# Patient Record
Sex: Female | Born: 1985 | Race: White | Hispanic: No | Smoking: Current every day smoker
Health system: Southern US, Community
[De-identification: ages and names within clinical notes are randomized; demographics above are authoritative.]

## PROBLEM LIST (undated history)

## (undated) DIAGNOSIS — N200 Calculus of kidney: Secondary | ICD-10-CM

## (undated) DIAGNOSIS — R6889 Other general symptoms and signs: Secondary | ICD-10-CM

## (undated) DIAGNOSIS — F17210 Nicotine dependence, cigarettes, uncomplicated: Secondary | ICD-10-CM

## (undated) DIAGNOSIS — F419 Anxiety disorder, unspecified: Secondary | ICD-10-CM

## (undated) DIAGNOSIS — F329 Major depressive disorder, single episode, unspecified: Secondary | ICD-10-CM

## (undated) DIAGNOSIS — Z889 Allergy status to unspecified drugs, medicaments and biological substances status: Secondary | ICD-10-CM

## (undated) DIAGNOSIS — R0602 Shortness of breath: Secondary | ICD-10-CM

## (undated) DIAGNOSIS — M25512 Pain in left shoulder: Secondary | ICD-10-CM

## (undated) DIAGNOSIS — G8929 Other chronic pain: Secondary | ICD-10-CM

## (undated) DIAGNOSIS — R7989 Other specified abnormal findings of blood chemistry: Secondary | ICD-10-CM

## (undated) DIAGNOSIS — G43909 Migraine, unspecified, not intractable, without status migrainosus: Secondary | ICD-10-CM

## (undated) DIAGNOSIS — M539 Dorsopathy, unspecified: Secondary | ICD-10-CM

## (undated) DIAGNOSIS — F32A Depression, unspecified: Secondary | ICD-10-CM

## (undated) DIAGNOSIS — J45909 Unspecified asthma, uncomplicated: Secondary | ICD-10-CM

## (undated) DIAGNOSIS — F410 Panic disorder [episodic paroxysmal anxiety] without agoraphobia: Secondary | ICD-10-CM

## (undated) DIAGNOSIS — N879 Dysplasia of cervix uteri, unspecified: Secondary | ICD-10-CM

## (undated) DIAGNOSIS — Z8709 Personal history of other diseases of the respiratory system: Secondary | ICD-10-CM

## (undated) DIAGNOSIS — G709 Myoneural disorder, unspecified: Secondary | ICD-10-CM

## (undated) DIAGNOSIS — D649 Anemia, unspecified: Secondary | ICD-10-CM

## (undated) DIAGNOSIS — E349 Endocrine disorder, unspecified: Secondary | ICD-10-CM

## (undated) DIAGNOSIS — N898 Other specified noninflammatory disorders of vagina: Secondary | ICD-10-CM

## (undated) DIAGNOSIS — L680 Hirsutism: Secondary | ICD-10-CM

## (undated) DIAGNOSIS — J40 Bronchitis, not specified as acute or chronic: Secondary | ICD-10-CM

## (undated) DIAGNOSIS — R109 Unspecified abdominal pain: Principal | ICD-10-CM

## (undated) DIAGNOSIS — Z309 Encounter for contraceptive management, unspecified: Principal | ICD-10-CM

## (undated) DIAGNOSIS — J302 Other seasonal allergic rhinitis: Secondary | ICD-10-CM

## (undated) DIAGNOSIS — F319 Bipolar disorder, unspecified: Secondary | ICD-10-CM

## (undated) HISTORY — DX: Dysplasia of cervix uteri, unspecified: N87.9

## (undated) HISTORY — DX: Unspecified asthma, uncomplicated: J45.909

## (undated) HISTORY — DX: Allergy status to unspecified drugs, medicaments and biological substances: Z88.9

## (undated) HISTORY — DX: Depression, unspecified: F32.A

## (undated) HISTORY — DX: Anxiety disorder, unspecified: F41.9

## (undated) HISTORY — DX: Myoneural disorder, unspecified: G70.9

## (undated) HISTORY — DX: Endocrine disorder, unspecified: E34.9

## (undated) HISTORY — DX: Anemia, unspecified: D64.9

## (undated) HISTORY — DX: Migraine, unspecified, not intractable, without status migrainosus: G43.909

## (undated) HISTORY — DX: Hirsutism: L68.0

## (undated) HISTORY — DX: Unspecified abdominal pain: R10.9

## (undated) HISTORY — PX: DILATION AND CURETTAGE OF UTERUS: SHX78

## (undated) HISTORY — DX: Encounter for contraceptive management, unspecified: Z30.9

## (undated) HISTORY — DX: Other specified noninflammatory disorders of vagina: N89.8

---
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
## 2004-03-02 HISTORY — PX: HX DILATION AND CURETTAGE: SHX78

## 2004-12-26 ENCOUNTER — Emergency Department (HOSPITAL_COMMUNITY): Admission: EM | Admit: 2004-12-26 | Discharge: 2004-12-26 | Payer: Self-pay | Admitting: Emergency Medicine

## 2006-08-15 ENCOUNTER — Emergency Department (HOSPITAL_COMMUNITY): Admission: EM | Admit: 2006-08-15 | Discharge: 2006-08-15 | Payer: Self-pay | Admitting: Emergency Medicine

## 2006-11-29 ENCOUNTER — Ambulatory Visit (HOSPITAL_COMMUNITY): Admission: RE | Admit: 2006-11-29 | Discharge: 2006-11-29 | Payer: Self-pay | Admitting: General Surgery

## 2007-01-14 ENCOUNTER — Ambulatory Visit (HOSPITAL_COMMUNITY): Admission: RE | Admit: 2007-01-14 | Discharge: 2007-01-14 | Payer: Self-pay | Admitting: Endocrinology

## 2007-02-05 ENCOUNTER — Emergency Department (HOSPITAL_COMMUNITY): Admission: EM | Admit: 2007-02-05 | Discharge: 2007-02-05 | Payer: Self-pay | Admitting: Emergency Medicine

## 2007-05-19 ENCOUNTER — Ambulatory Visit (HOSPITAL_COMMUNITY): Admission: RE | Admit: 2007-05-19 | Discharge: 2007-05-19 | Payer: Self-pay | Admitting: Endocrinology

## 2008-04-07 ENCOUNTER — Emergency Department
Admission: EM | Admit: 2008-04-07 | Disposition: A | Discharge status: Home / Self Care | Payer: Self-pay | Source: Ambulatory Visit

## 2010-03-23 ENCOUNTER — Encounter: Payer: Self-pay | Admitting: Pediatrics

## 2010-03-23 ENCOUNTER — Encounter: Payer: Self-pay | Admitting: Endocrinology

## 2010-05-01 ENCOUNTER — Emergency Department (HOSPITAL_COMMUNITY)
Admission: EM | Admit: 2010-05-01 | Discharge: 2010-05-01 | Disposition: A | Discharge status: Home / Self Care | Payer: Medicaid Other | Attending: Emergency Medicine | Admitting: Emergency Medicine

## 2010-05-01 DIAGNOSIS — R112 Nausea with vomiting, unspecified: Secondary | ICD-10-CM | POA: Insufficient documentation

## 2010-05-01 DIAGNOSIS — R197 Diarrhea, unspecified: Secondary | ICD-10-CM | POA: Insufficient documentation

## 2010-05-01 DIAGNOSIS — K5289 Other specified noninfective gastroenteritis and colitis: Secondary | ICD-10-CM | POA: Insufficient documentation

## 2010-05-01 LAB — BASIC METABOLIC PANEL
CO2: 26 mEq/L (ref 19–32)
Creatinine, Ser: 0.66 mg/dL (ref 0.4–1.2)
GFR calc Af Amer: >60 mL/min (ref >60)
Glucose, Bld: 109 mg/dL — ABNORMAL HIGH (ref 70–99)

## 2010-05-01 LAB — DIFFERENTIAL
Eosinophils Absolute: 0 10*3/uL (ref 0.0–0.7)
Eosinophils Relative: 1 % (ref 0–5)
Lymphocytes Relative: 47 % — ABNORMAL HIGH (ref 12–46)
Lymphs Abs: 2.5 10*3/uL (ref 0.7–4.0)
Monocytes Relative: 7 % (ref 3–12)
Neutro Abs: 2.4 10*3/uL (ref 1.7–7.7)
Neutrophils Relative %: 45 % (ref 43–77)

## 2010-05-01 LAB — CBC
MCH: 32.9 pg (ref 26.0–34.0)
RBC: 4.16 MIL/uL (ref 3.87–5.11)
RDW: 12.1 % (ref 11.5–15.5)
WBC: 5.4 10*3/uL (ref 4.0–10.5)

## 2010-10-15 ENCOUNTER — Emergency Department (HOSPITAL_COMMUNITY)
Admission: EM | Admit: 2010-10-15 | Discharge: 2010-10-16 | Disposition: A | Discharge status: Home / Self Care | Payer: Medicaid Other | Attending: Emergency Medicine | Admitting: Emergency Medicine

## 2010-10-15 ENCOUNTER — Encounter: Payer: Self-pay | Admitting: *Deleted

## 2010-10-15 ENCOUNTER — Emergency Department (HOSPITAL_COMMUNITY): Payer: Medicaid Other

## 2010-10-15 DIAGNOSIS — J45909 Unspecified asthma, uncomplicated: Secondary | ICD-10-CM | POA: Insufficient documentation

## 2010-10-15 DIAGNOSIS — F17200 Nicotine dependence, unspecified, uncomplicated: Secondary | ICD-10-CM | POA: Insufficient documentation

## 2010-10-15 DIAGNOSIS — O200 Threatened abortion: Secondary | ICD-10-CM | POA: Insufficient documentation

## 2010-10-15 LAB — URINALYSIS, ROUTINE W REFLEX MICROSCOPIC
Bilirubin Urine: NEGATIVE
Hgb urine dipstick: NEGATIVE
Ketones, ur: NEGATIVE mg/dL
Protein, ur: NEGATIVE mg/dL
Specific Gravity, Urine: 1.025 (ref 1.005–1.030)

## 2010-10-15 NOTE — ED Notes (Signed)
Unable to obtain fetal heart tones.  Dr. Lynelle Doctor notified via phone.  Pt is currently going over to ultrasound.  Nursing staff to continue to monitor.

## 2010-10-15 NOTE — ED Notes (Signed)
Pt reports pain in lower abd starting just prior to arrival here in ED, pt reports she is approx [redacted] weeks pregnant and is scheduled to see Dr. Emelda Fear on aug 30, pt reports her last period June 5th, pt denies any bleeding or vaginal d/c

## 2010-10-15 NOTE — ED Notes (Signed)
Pregnant with abdominal pain, denies bleeding, states pain started within the last hour

## 2010-10-15 NOTE — ED Provider Notes (Addendum)
History     CSN: [id number] Arrival date & time: 10/15/2010 10:20 PM  Chief Complaint  Patient presents with  . Abdominal Pain   Patient is a 25 y.o. female presenting with abdominal pain. The history is provided by the patient.  Abdominal Pain The primary symptoms of the illness include abdominal pain. The primary symptoms of the illness do not include vaginal bleeding. The current episode started 1 to 2 hours ago. The onset of the illness was sudden. The problem has not changed since onset. The abdominal pain radiates to the RLQ. The abdominal pain is relieved by nothing. Exacerbated by: nothing.  The patient states that she believes she is currently pregnant ([redacted] weeks pregnant, [facility] May 12 2011). Symptoms associated with the illness do not include anorexia, diaphoresis, constipation, urgency, hematuria, frequency or back pain.    Past Medical History  Diagnosis Date  . Asthma     History reviewed. No pertinent past surgical history.  History reviewed. No pertinent family history.  History  Substance Use Topics  . Smoking status: Current Everyday Smoker    Types: Cigarettes  . Smokeless tobacco: Not on file  . Alcohol Use: No    OB History    Grav Para Term Preterm Abortions TAB SAB Ect Mult Living   1               Review of Systems  Constitutional: Negative for diaphoresis.  Gastrointestinal: Positive for abdominal pain. Negative for constipation and anorexia.  Genitourinary: Negative for urgency, frequency, hematuria and vaginal bleeding.  Musculoskeletal: Negative for back pain.  All other systems reviewed and are negative.    Physical Exam  BP 116/80  Pulse 99  Temp(Src) 99.2 F (37.3 C) (Oral)  Resp 20  Ht 5\' 3"  (1.6 m)  Wt 112 lb (50.803 kg)  BMI 19.84 kg/m2  SpO2 98%  Physical Exam  Constitutional: She appears well-developed and well-nourished. No distress.  HENT:  Head: Normocephalic and atraumatic.  Right Ear: External ear normal.  Left Ear:  External ear normal.  Eyes: Conjunctivae are normal. Right eye exhibits no discharge. Left eye exhibits no discharge. No scleral icterus.  Neck: Neck supple. No tracheal deviation present.  Cardiovascular: Normal rate, regular rhythm and intact distal pulses.   Pulmonary/Chest: Effort normal and breath sounds normal. No stridor. No respiratory distress. She has no wheezes. She has no rales.  Abdominal: Soft. Bowel sounds are normal. She exhibits no distension. There is no tenderness. There is no rebound and no guarding.  Genitourinary: Vagina normal. Pelvic exam was performed with patient supine. There is no rash, tenderness or lesion on the right labia. There is no rash, tenderness or lesion on the left labia. Uterus is enlarged. Cervix exhibits no motion tenderness and no discharge. Right adnexum displays no mass, no tenderness and no fullness. Left adnexum displays no mass, no tenderness and no fullness.       Cervix closed, uterus gravid  Musculoskeletal: She exhibits no edema and no tenderness.  Neurological: She is alert. She has normal strength. No sensory deficit. Cranial nerve deficit:  no gross defecits noted. She exhibits normal muscle tone. She displays no seizure activity. Coordination normal.  Skin: Skin is warm and dry. No rash noted.  Psychiatric: She has a normal mood and affect.    ED Course  Procedures US Ob Comp Less 14 Wks  10/16/2010  *RADIOLOGY REPORT*  Clinical Data: First trimester pregnancy.  Pelvic pain.  OBSTETRIC <14 WK [place] AND TRANSVAGINAL  OB US  Technique: Both transabdominal and transvaginal ultrasound examinations were performed for complete evaluation of the gestation as well as the maternal uterus, adnexal regions, and pelvic cul-de-sac.  Findings: Transabdominal and transvaginal sonography reveals an intrauterine gestational sac with mean sac diameter of 4.9 mm compatible with 5 weeks 2 days gestation.  Yolk sac is visible but an embryo is not yet visible.  No  subchorionic hemorrhage is identified.  Double decidual reaction noted.  The right ovary contains a 2.3 cm cyst but otherwise appears normal.  The left ovary appears normal.  Trace maternal free pelvic fluid is identified.  IMPRESSION:  1.  Intrauterine gestational sac with internal yolk sac.  Mean sac diameter places the pregnancy at 5 weeks 2 days gestation. 2.  2.3 cm right ovarian simple appearing cyst. 3.  Trace free pelvic fluid.  Original Report Authenticated By: Dellia Hagadorn, M.D.   US Ob Transvaginal  10/16/2010  *RADIOLOGY REPORT*  Clinical Data: First trimester pregnancy.  Pelvic pain.  OBSTETRIC <14 WK [place] AND TRANSVAGINAL OB US  Technique: Both transabdominal and transvaginal ultrasound examinations were performed for complete evaluation of the gestation as well as the maternal uterus, adnexal regions, and pelvic cul-de-sac.  Findings: Transabdominal and transvaginal sonography reveals an intrauterine gestational sac with mean sac diameter of 4.9 mm compatible with 5 weeks 2 days gestation.  Yolk sac is visible but an embryo is not yet visible.  No subchorionic hemorrhage is identified.  Double decidual reaction noted.  The right ovary contains a 2.3 cm cyst but otherwise appears normal.  The left ovary appears normal.  Trace maternal free pelvic fluid is identified.  IMPRESSION:  1.  Intrauterine gestational sac with internal yolk sac.  Mean sac diameter places the pregnancy at 5 weeks 2 days gestation. 2.  2.3 cm right ovarian simple appearing cyst. 3.  Trace free pelvic fluid.  Original Report Authenticated By: Dellia Sloma, M.D.    Labs Reviewed  URINALYSIS, ROUTINE W REFLEX MICROSCOPIC  ABO/RH  CBC  URINE CULTURE    MDM Patient had mild abdominal pain. Her ultrasound shows a normal IUP at 5 weeks 2 days. No signs of ectopic pregnancy. No sign of UTI. Her exam is rather benign no suggestion of appendicitis. Certainly cannot rule out threatened AB, however this point  patient appears stable with reassuring findings on exam. We'll discharge her to home to follow up with her OB doctor. Precautions given.  I will add on a quantitative hCG for followup purposes with  her OB doctor     Celene Kras, MD 10/16/10 7829  Celene Kras, MD 10/17/10 [phone]

## 2010-10-16 LAB — CBC
HCT: 37.4 % (ref 36.0–46.0)
Hemoglobin: 13 g/dL (ref 12.0–15.0)
MCHC: 34.8 g/dL (ref 30.0–36.0)
MCV: 96.6 fL (ref 78.0–100.0)
Platelets: 200 10*3/uL (ref 150–400)
RBC: 3.87 MIL/uL (ref 3.87–5.11)
WBC: 6 10*3/uL (ref 4.0–10.5)

## 2010-10-16 LAB — HCG, QUANTITATIVE, PREGNANCY: hCG, Beta Chain, Quant, S: 4550 m[IU]/mL — ABNORMAL HIGH (ref <5)

## 2010-10-16 LAB — ABO/RH: ABO/RH(D): A POS

## 2010-10-16 MED ORDER — ALBUTEROL SULFATE HFA 108 (90 BASE) MCG/ACT IN AERS: 1.0000 | INHALATION_SPRAY | Freq: Four times a day (QID) | RESPIRATORY_TRACT | Status: DC | PRN

## 2010-10-17 LAB — GC/CHLAMYDIA PROBE AMP, GENITAL
Chlamydia, DNA Probe: NEGATIVE
GC Probe Amp, Genital: NEGATIVE

## 2010-10-17 LAB — URINE CULTURE

## 2010-10-30 ENCOUNTER — Other Ambulatory Visit: Payer: Self-pay | Admitting: Adult Health

## 2010-10-30 ENCOUNTER — Other Ambulatory Visit (HOSPITAL_COMMUNITY)
Admission: RE | Admit: 2010-10-30 | Discharge: 2010-10-30 | Disposition: A | Discharge status: Home / Self Care | Payer: Medicaid Other | Source: Ambulatory Visit | Attending: Obstetrics and Gynecology | Admitting: Obstetrics and Gynecology

## 2010-10-30 DIAGNOSIS — Z01419 Encounter for gynecological examination (general) (routine) without abnormal findings: Secondary | ICD-10-CM | POA: Insufficient documentation

## 2010-10-30 DIAGNOSIS — Z113 Encounter for screening for infections with a predominantly sexual mode of transmission: Secondary | ICD-10-CM | POA: Insufficient documentation

## 2010-10-30 DIAGNOSIS — R87810 Cervical high risk human papillomavirus (HPV) DNA test positive: Secondary | ICD-10-CM | POA: Insufficient documentation

## 2010-11-29 ENCOUNTER — Encounter (HOSPITAL_COMMUNITY): Payer: Self-pay | Admitting: *Deleted

## 2010-11-29 ENCOUNTER — Emergency Department (HOSPITAL_COMMUNITY)
Admission: EM | Admit: 2010-11-29 | Discharge: 2010-11-30 | Disposition: A | Discharge status: Home / Self Care | Payer: Medicaid Other | Attending: Emergency Medicine | Admitting: Emergency Medicine

## 2010-11-29 DIAGNOSIS — O219 Vomiting of pregnancy, unspecified: Secondary | ICD-10-CM

## 2010-11-29 DIAGNOSIS — O210 Mild hyperemesis gravidarum: Secondary | ICD-10-CM | POA: Insufficient documentation

## 2010-11-29 DIAGNOSIS — R509 Fever, unspecified: Secondary | ICD-10-CM | POA: Insufficient documentation

## 2010-11-29 DIAGNOSIS — O99330 Smoking (tobacco) complicating pregnancy, unspecified trimester: Secondary | ICD-10-CM | POA: Insufficient documentation

## 2010-11-29 DIAGNOSIS — O99891 Other specified diseases and conditions complicating pregnancy: Secondary | ICD-10-CM | POA: Insufficient documentation

## 2010-11-29 LAB — BASIC METABOLIC PANEL
CO2: 27 mEq/L (ref 19–32)
Chloride: 100 mEq/L (ref 96–112)
Glucose, Bld: 90 mg/dL (ref 70–99)
Potassium: 3.6 mEq/L (ref 3.5–5.1)
Sodium: 136 mEq/L (ref 135–145)

## 2010-11-29 LAB — CBC
Hemoglobin: 12.7 g/dL (ref 12.0–15.0)
Platelets: 222 10*3/uL (ref 150–400)
RBC: 3.8 MIL/uL — ABNORMAL LOW (ref 3.87–5.11)
WBC: 8.2 10*3/uL (ref 4.0–10.5)

## 2010-11-29 LAB — DIFFERENTIAL
Eosinophils Absolute: 0 10*3/uL (ref 0.0–0.7)
Lymphocytes Relative: 31 % (ref 12–46)
Lymphs Abs: 2.5 10*3/uL (ref 0.7–4.0)
Monocytes Relative: 5 % (ref 3–12)
Neutro Abs: 5.2 10*3/uL (ref 1.7–7.7)
Neutrophils Relative %: 64 % (ref 43–77)

## 2010-11-29 LAB — URINALYSIS, ROUTINE W REFLEX MICROSCOPIC
Hgb urine dipstick: NEGATIVE
Specific Gravity, Urine: >1.03 — ABNORMAL HIGH (ref 1.005–1.030)
Urobilinogen, UA: 0.2 mg/dL (ref 0.0–1.0)
pH: 6 (ref 5.0–8.0)

## 2010-11-29 MED ORDER — SODIUM CHLORIDE 0.9 % IV SOLN
INTRAVENOUS | Status: DC
  Administered 2010-11-29: 22:00:00 via INTRAVENOUS

## 2010-11-29 MED ORDER — ONDANSETRON HCL 4 MG/2ML IJ SOLN
4.0000 mg | Freq: Once | INTRAMUSCULAR | Status: AC
  Administered 2010-11-29: 4 mg via INTRAVENOUS
  Filled 2010-11-29: qty 2

## 2010-11-29 MED ORDER — SODIUM CHLORIDE 0.9 % IV BOLUS (SEPSIS)
1000.0000 mL | Freq: Once | INTRAVENOUS | Status: AC
  Administered 2010-11-29: 1000 mL via INTRAVENOUS

## 2010-11-29 NOTE — ED Provider Notes (Signed)
History     CSN: [id number] Arrival date & time: 11/29/2010  9:40 PM  Chief Complaint  Patient presents with  . Nausea  . Emesis During Pregnancy  . Fever  . Chills    (Consider location/radiation/quality/duration/timing/severity/associated sxs/prior treatment) HPI Comments: Seen 2306. Patient is G3, P2, 3 months pregnant, [facility] 06/10/10. Seen by Dr. Emelda Fear for similar c/o last week. On Zofran. Next appointment is Wednesday, 12/03/10.   Patient is a 25 y.o. female presenting with vomiting. The history is provided by the patient.  Emesis  This is a new (Patient states she has been having vomiting since last  Saturday. Has been treated with Zpack for infection) problem. Episode frequency: vomiting after each meal. Using zofran without succes. The problem has not changed since onset.The emesis has an appearance of stomach contents. There has been no fever. Pertinent negatives include no abdominal pain, no chills and no diarrhea.    Past Medical History  Diagnosis Date  . Asthma     History reviewed. No pertinent past surgical history.  Family History  Problem Relation Age of Onset  . Hypertension Mother   . Hypertension Father     History  Substance Use Topics  . Smoking status: Current Everyday Smoker    Types: Cigarettes  . Smokeless tobacco: Not on file  . Alcohol Use: No    OB History    Grav Para Term Preterm Abortions TAB SAB Ect Mult Living   1               Review of Systems  Constitutional: Negative for chills.  Gastrointestinal: Positive for nausea and vomiting. Negative for abdominal pain and diarrhea.    Allergies  Amoxicillin; Other; and Penicillins  Home Medications   Current Outpatient Rx  Name Route Sig Dispense Refill  . ALBUTEROL SULFATE HFA 108 (90 BASE) MCG/ACT IN AERS Inhalation Inhale 1-2 puffs into the lungs every 6 (six) hours as needed for wheezing. 1 Inhaler 0  . PRENATA PO Oral Take 1 tablet by mouth daily.        BP 109/63  Pulse  96  Temp 98.9 F (37.2 C)  Resp 20  Ht 5\' 3"  (1.6 m)  Wt 116 lb (52.617 kg)  BMI 20.55 kg/m2  SpO2 100%  Physical Exam  Nursing note and vitals reviewed. Constitutional: She is oriented to person, place, and time. She appears well-developed and well-nourished. No distress.  HENT:  Head: Normocephalic and atraumatic.  Eyes: EOM are normal. Pupils are equal, round, and reactive to light.  Neck: Normal range of motion.  Cardiovascular: Normal rate, normal heart sounds and intact distal pulses.   Pulmonary/Chest: Effort normal and breath sounds normal.  Abdominal: Soft. Bowel sounds are normal.  Genitourinary:       No cva tenderness  Musculoskeletal: Normal range of motion.  Neurological: She is alert and oriented to person, place, and time.  Skin: Skin is warm and dry.    ED Course  Procedures (including critical care time)  Labs Reviewed  CBC - Abnormal; Notable for the following:    RBC 3.80 (*)    All other components within normal limits  BASIC METABOLIC PANEL - Abnormal; Notable for the following:    Creatinine, Ser <0.47 (*)    All other components within normal limits  URINALYSIS, ROUTINE W REFLEX MICROSCOPIC - Abnormal; Notable for the following:    Appearance CLOUDY (*)    Specific Gravity, Urine >1.030 (*)    All other components within  normal limits  DIFFERENTIAL  URINE CULTURE     Patient with vomiting during her pregnancy. She has been using zofran without success. Given IVF x 2 liters while in the ER, taken PO fluids. No vomiting while here. Rx for phenergan suppositories to use in conjection with zofran. Follow up appointment with OB/GYN is Wednesday.Pt feels improved after observation and/or treatment in ED. MDM Reviewed: nursing note and vitals Interpretation: labs        Nicoletta Dress. Colon Branch, MD 11/30/10 [phone]

## 2010-11-30 MED ORDER — PROMETHAZINE HCL 25 MG RE SUPP: 25.0000 mg | Freq: Four times a day (QID) | RECTAL | Status: DC | PRN

## 2010-12-01 LAB — URINE CULTURE: Culture  Setup Time: 201209302115

## 2010-12-07 ENCOUNTER — Encounter (HOSPITAL_COMMUNITY): Payer: Self-pay | Admitting: Emergency Medicine

## 2010-12-07 ENCOUNTER — Emergency Department (HOSPITAL_COMMUNITY)
Admission: EM | Admit: 2010-12-07 | Discharge: 2010-12-07 | Disposition: A | Discharge status: Home / Self Care | Payer: Medicaid Other | Attending: Emergency Medicine | Admitting: Emergency Medicine

## 2010-12-07 DIAGNOSIS — O99891 Other specified diseases and conditions complicating pregnancy: Secondary | ICD-10-CM | POA: Insufficient documentation

## 2010-12-07 DIAGNOSIS — Z87891 Personal history of nicotine dependence: Secondary | ICD-10-CM | POA: Insufficient documentation

## 2010-12-07 DIAGNOSIS — Z202 Contact with and (suspected) exposure to infections with a predominantly sexual mode of transmission: Secondary | ICD-10-CM | POA: Insufficient documentation

## 2010-12-07 HISTORY — DX: Other seasonal allergic rhinitis: J30.2

## 2010-12-07 HISTORY — DX: Depression, unspecified: F32.A

## 2010-12-07 HISTORY — DX: Bronchitis, not specified as acute or chronic: J40

## 2010-12-07 HISTORY — DX: Major depressive disorder, single episode, unspecified: F32.9

## 2010-12-07 LAB — URINALYSIS, ROUTINE W REFLEX MICROSCOPIC
Bilirubin Urine: NEGATIVE
Hgb urine dipstick: NEGATIVE
Specific Gravity, Urine: 1.02 (ref 1.005–1.030)
Urobilinogen, UA: 0.2 mg/dL (ref 0.0–1.0)
pH: 8.5 — ABNORMAL HIGH (ref 5.0–8.0)

## 2010-12-07 MED ORDER — AZITHROMYCIN 250 MG PO TABS
1000.0000 mg | ORAL_TABLET | Freq: Once | ORAL | Status: AC
  Administered 2010-12-07: 1000 mg via ORAL
  Filled 2010-12-07: qty 4

## 2010-12-07 MED ORDER — CEFTRIAXONE SODIUM 250 MG IJ SOLR
250.0000 mg | Freq: Once | INTRAMUSCULAR | Status: AC
  Administered 2010-12-07: 250 mg via INTRAMUSCULAR
  Filled 2010-12-07: qty 250

## 2010-12-07 MED ORDER — LIDOCAINE HCL (PF) 1 % IJ SOLN
INTRAMUSCULAR | Status: AC
  Administered 2010-12-07: 5 mL
  Filled 2010-12-07: qty 5

## 2010-12-07 NOTE — ED Notes (Signed)
Patient requesting to be checked for possible chlamydia. Per patient ex-boyfriend confided in mother that he was recently diagnosed. Patient denies any vaginal discharge, pain, or itching.

## 2010-12-07 NOTE — ED Provider Notes (Signed)
History     CSN: [id number] Arrival date & time: 12/07/2010  4:46 PM  Chief Complaint  Patient presents with  . Exposure to STD    (Consider location/radiation/quality/duration/timing/severity/associated sxs/prior treatment) HPI This 25 year old female presents with concerns over possible exposure to sexually transmitted diseases. Notably she is 3 months pregnant, in an otherwise unremarkable pregnancy. She notes that her boyfriend just informed her that he tested positive for Chlamydia. Her last unprotected sexual encounter was 2 weeks ago. She denies any current focal complaints, including; fever, chills, vaginal discharge, abdominal pain, nausea. Past Medical History  Diagnosis Date  . Asthma   . Bronchitis   . Seasonal allergies   . Depression     History reviewed. No pertinent past surgical history.  Family History  Problem Relation Age of Onset  . Hypertension Mother   . Hypertension Father     History  Substance Use Topics  . Smoking status: Former Smoker -- 1.5 packs/day for 11 years    Types: Cigarettes    Quit date: 12/01/2010  . Smokeless tobacco: Never Used  . Alcohol Use: No    OB History    Grav Para Term Preterm Abortions TAB SAB Ect Mult Living   4 2  2 1  1   2       Review of Systems  All other systems reviewed and are negative.    Allergies  Amoxicillin; Other; and Penicillins  Home Medications   Current Outpatient Rx  Name Route Sig Dispense Refill  . ALBUTEROL SULFATE HFA 108 (90 BASE) MCG/ACT IN AERS Inhalation Inhale 1-2 puffs into the lungs every 6 (six) hours as needed for wheezing. 1 Inhaler 0  . PRENATA PO Oral Take 1 tablet by mouth daily.      Marland Kitchen PROMETHAZINE HCL 25 MG RE SUPP Rectal Place 1 suppository (25 mg total) rectally every 6 (six) hours as needed for nausea. 12 each 0    BP 105/70  Pulse 100  Temp(Src) 98.9 F (37.2 C) (Oral)  Resp 14  Ht 5\' 3"  (1.6 m)  Wt 113 lb (51.256 kg)  BMI 20.02 kg/m2  SpO2  100%  Physical Exam  Constitutional: She is oriented to person, place, and time. She appears well-developed and well-nourished.  HENT:  Head: Normocephalic and atraumatic.  Eyes: EOM are normal.  Cardiovascular: Normal rate and regular rhythm.   Pulmonary/Chest: Effort normal and breath sounds normal.  Abdominal: She exhibits no distension.  Musculoskeletal: She exhibits no edema and no tenderness.  Neurological: She is alert and oriented to person, place, and time.  Skin: Skin is warm and dry.    ED Course  Procedures (including critical care time)   Labs Reviewed  URINALYSIS, ROUTINE W REFLEX MICROSCOPIC   No results found.   No diagnosis found.    MDM  This well-appearing 25 year old female who is G3, P1 now presents with concerns of exposure to chlamydia. The patient's preference is to be treated empirically, and she'll receive treatment for Chlamydia and gonorrhea. Urinalysis was also sent.    UA neg - Patient treated for G/C and D/C home.  Gerhard Munch, MD 12/07/10 2145

## 2010-12-07 NOTE — ED Notes (Signed)
Signature Pad not working. Pt a/ox4. Resp even and unlabored. NAD at this time. Pt agrees to D/C. Pt ambulated to POV with steady gate.

## 2010-12-08 LAB — BASIC METABOLIC PANEL
Chloride: 101
GFR calc Af Amer: >60
Potassium: 3.7

## 2010-12-08 LAB — CBC
HCT: 40.6
Hemoglobin: 13.6
MCV: 97.4
RBC: 4.18
WBC: 6.4

## 2010-12-08 LAB — URINALYSIS, ROUTINE W REFLEX MICROSCOPIC
Glucose, UA: NEGATIVE
pH: 8

## 2010-12-08 LAB — DIFFERENTIAL
Eosinophils Absolute: 0 — ABNORMAL LOW
Eosinophils Relative: 0
Lymphs Abs: 2
Monocytes Relative: 6
Neutrophils Relative %: 62

## 2010-12-08 LAB — HEPATIC FUNCTION PANEL
Albumin: 4.5
Alkaline Phosphatase: 70
Total Bilirubin: 0.6

## 2010-12-08 LAB — LIPASE, BLOOD: Lipase: 30

## 2010-12-20 ENCOUNTER — Encounter (HOSPITAL_COMMUNITY): Payer: Self-pay | Admitting: *Deleted

## 2010-12-20 ENCOUNTER — Inpatient Hospital Stay (HOSPITAL_COMMUNITY)
Admission: AD | Admit: 2010-12-20 | Discharge: 2010-12-20 | Disposition: A | Discharge status: Home / Self Care | Payer: Medicaid Other | Source: Ambulatory Visit | Attending: Obstetrics & Gynecology | Admitting: Obstetrics & Gynecology

## 2010-12-20 DIAGNOSIS — Z3480 Encounter for supervision of other normal pregnancy, unspecified trimester: Secondary | ICD-10-CM

## 2010-12-20 DIAGNOSIS — N898 Other specified noninflammatory disorders of vagina: Secondary | ICD-10-CM | POA: Insufficient documentation

## 2010-12-20 DIAGNOSIS — O368190 Decreased fetal movements, unspecified trimester, not applicable or unspecified: Secondary | ICD-10-CM | POA: Insufficient documentation

## 2010-12-20 DIAGNOSIS — Z3490 Encounter for supervision of normal pregnancy, unspecified, unspecified trimester: Secondary | ICD-10-CM

## 2010-12-20 NOTE — Progress Notes (Signed)
Georges Mouse CNM in to see pt. Spec exam done and fern slide obtained. Pt tol well. Cervix closed and thick.

## 2010-12-20 NOTE — Progress Notes (Signed)
G3P2 at 15.3wks. Leaking white fld since Monday. No odor. Small amt vag itching. States baby has been active but less FM for past 3 days.

## 2010-12-20 NOTE — ED Provider Notes (Signed)
History     Chief Complaint  Patient presents with  . Vaginal Discharge    onset Monday    HPI C/O clear vaginal discharge since Monday, thinks she may be leaking fluid. Treated for Chlamydia less than 2 weeks ago following exposure. Also c/o no fetal movement x 3 days, was previously feeling movement. PNC at [facility].   OB History    Grav Para Term Preterm Abortions TAB SAB Ect Mult Living   4 2  2 1  1   2       Past Medical History  Diagnosis Date  . Asthma   . Bronchitis   . Seasonal allergies   . Depression     Past Surgical History  Procedure Date  . Dilation and curettage of uterus     Family History  Problem Relation Age of Onset  . Hypertension Mother   . Hypertension Father     History  Substance Use Topics  . Smoking status: Former Smoker -- 1.5 packs/day for 11 years    Types: Cigarettes    Quit date: 12/01/2010  . Smokeless tobacco: Never Used  . Alcohol Use: No    Allergies:  Allergies  Allergen Reactions  . Amoxicillin Hives and Other (See Comments)    Swells throat close  . Other Hives and Rash    SPICY FOOD  . Penicillins Hives, Itching and Rash    Prescriptions prior to admission  Medication Sig Dispense Refill  . acetaminophen (TYLENOL) 500 MG tablet Take 500 mg by mouth every 6 (six) hours as needed. Patient used this medication for a headache.       . albuterol (PROVENTIL HFA;VENTOLIN HFA) 108 (90 BASE) MCG/ACT inhaler Inhale 1-2 puffs into the lungs every 6 (six) hours as needed for wheezing.  1 Inhaler  0  . ondansetron (ZOFRAN) 8 MG tablet Take 8 mg by mouth daily.        . Prenatal w/o A Vit-Fe Fum-FA (PRENATA PO) Take 1 tablet by mouth daily.          Review of Systems  Constitutional: Negative.   Respiratory: Negative.   Cardiovascular: Negative.   Gastrointestinal: Negative for nausea, vomiting, abdominal pain, diarrhea and constipation.  Genitourinary: Negative for dysuria, urgency, frequency, hematuria and flank pain.        Negative for vaginal bleeding, cramping/contractions  Musculoskeletal: Negative.   Neurological: Negative.   Psychiatric/Behavioral: Negative.    Physical Exam   Blood pressure 95/51, pulse 89, temperature 98.9 F (37.2 C), temperature source Oral, resp. rate 16, height 5\' 3"  (1.6 m), weight 52.436 kg (115 lb 9.6 oz).  Physical Exam  Nursing note and vitals reviewed. Constitutional: She is oriented to person, place, and time. She appears well-developed and well-nourished. No distress.  Cardiovascular: Normal rate.   Respiratory: Effort normal.  GI: Soft. There is no tenderness.  Genitourinary: No bleeding around the vagina. Vaginal discharge (scant, white, creamy) found.       ZOX:WRUEAV closed and long   Musculoskeletal: Normal range of motion.  Neurological: She is alert and oriented to person, place, and time.  Skin: Skin is warm and dry.  Psychiatric: She has a normal mood and affect.    MAU Course  Procedures Fern negative  Assessment and Plan  Normal exam Rev'd normal expectations for fetal movement at this EGA F/U as scheduled   Damione Robideau 12/20/2010, 7:44 PM

## 2010-12-20 NOTE — Progress Notes (Signed)
Written and verbal d/c instructions given and understanding voiced. 

## 2010-12-20 NOTE — Progress Notes (Signed)
Onset of clear vaginal discharge since Monday, no fm in 3 days.

## 2010-12-23 NOTE — ED Provider Notes (Signed)
Attestation of Attending Supervision of Advanced Practitioner: Evaluation and management procedures were performed by the PA/NP/CNM/OB Fellow under my supervision/collaboration. Chart reviewed and agree with management and plan.  Lorana Maffeo A 12/23/2010 11:11 AM

## 2011-01-23 ENCOUNTER — Encounter (HOSPITAL_COMMUNITY): Payer: Self-pay | Admitting: *Deleted

## 2011-01-23 ENCOUNTER — Emergency Department (HOSPITAL_COMMUNITY)
Admission: EM | Admit: 2011-01-23 | Discharge: 2011-01-23 | Disposition: A | Discharge status: Home / Self Care | Payer: Medicaid Other | Attending: Emergency Medicine | Admitting: Emergency Medicine

## 2011-01-23 DIAGNOSIS — R0602 Shortness of breath: Secondary | ICD-10-CM | POA: Insufficient documentation

## 2011-01-23 DIAGNOSIS — J069 Acute upper respiratory infection, unspecified: Secondary | ICD-10-CM | POA: Insufficient documentation

## 2011-01-23 DIAGNOSIS — O99891 Other specified diseases and conditions complicating pregnancy: Secondary | ICD-10-CM | POA: Insufficient documentation

## 2011-01-23 DIAGNOSIS — J40 Bronchitis, not specified as acute or chronic: Secondary | ICD-10-CM | POA: Insufficient documentation

## 2011-01-23 MED ORDER — ALBUTEROL SULFATE (5 MG/ML) 0.5% IN NEBU
5.0000 mg | INHALATION_SOLUTION | Freq: Once | RESPIRATORY_TRACT | Status: AC
  Administered 2011-01-23: 5 mg via RESPIRATORY_TRACT
  Filled 2011-01-23: qty 1

## 2011-01-23 MED ORDER — ALBUTEROL SULFATE HFA 108 (90 BASE) MCG/ACT IN AERS
2.0000 | INHALATION_SPRAY | RESPIRATORY_TRACT | Status: AC
  Administered 2011-01-23: 2 via RESPIRATORY_TRACT
  Filled 2011-01-23: qty 6.7

## 2011-01-23 MED ORDER — IPRATROPIUM BROMIDE 0.02 % IN SOLN
0.5000 mg | Freq: Once | RESPIRATORY_TRACT | Status: AC
  Administered 2011-01-23: 0.5 mg via RESPIRATORY_TRACT
  Filled 2011-01-23: qty 2.5

## 2011-01-23 NOTE — ED Provider Notes (Signed)
History     CSN: [id number] Arrival date & time: 01/23/2011  6:11 PM    Chief Complaint  Patient presents with  . Shortness of Breath     HPI Pt was seen at 1850.  Per pt, c/o gradual onset and persistence of constant cough, SOB, runny/stuffy nose, and sinus congestion that began this morning.  Pt states she used her MDI with partial relief.  Pt is G4P2, [facility] 06/10/11 with EGA 20 and 2/7 weeks.  Denies abd/pelvic pain, no N/V/D, no back pain, no fevers, no rash, no sore throat, no vaginal bleeding/discharge.    Past Medical History  Diagnosis Date  . Asthma   . Bronchitis   . Seasonal allergies   . Depression     Past Surgical History  Procedure Date  . Dilation and curettage of uterus     Family History  Problem Relation Age of Onset  . Hypertension Mother   . Hypertension Father     History  Substance Use Topics  . Smoking status: Former Smoker -- 1.5 packs/day for 11 years    Types: Cigarettes    Quit date: 12/01/2010  . Smokeless tobacco: Never Used  . Alcohol Use: No    OB History    Grav Para Term Preterm Abortions TAB SAB Ect Mult Living   4 2  2 1  1   2       Review of Systems ROS: Statement: All systems negative except as marked or noted in the HPI; Constitutional: Negative for fever and chills. ; ; Eyes: Negative for eye pain, redness and discharge. ; ; ENMT: Negative for ear pain, hoarseness, sore throat.  +runny/stuffy nose, sinus congestion/pressure. ; ; Cardiovascular: Negative for chest pain, palpitations, diaphoresis,and peripheral edema. ; ; Respiratory: +cough, SOB.  Negative for wheezing and stridor. ; ; Gastrointestinal: Negative for nausea, vomiting, diarrhea and abdominal pain, blood in stool, hematemesis, jaundice and rectal bleeding. . ; ; Genitourinary: Negative for dysuria, flank pain and hematuria. GYN:  No vaginal bleeding, no vaginal discharge, no vulvar pain.; Musculoskeletal: Negative for back pain and neck pain. Negative for swelling  and trauma.; ; Skin: Negative for pruritus, rash, abrasions, blisters, bruising and skin lesion.; ; Neuro: Negative for headache, lightheadedness and neck stiffness. Negative for weakness, altered level of consciousness , altered mental status, extremity weakness, paresthesias, involuntary movement, seizure and syncope.     Allergies  Amoxicillin; Other; and Penicillins  Home Medications   Current Outpatient Rx  Name Route Sig Dispense Refill  . ACETAMINOPHEN 500 MG PO TABS Oral Take 500 mg by mouth every 6 (six) hours as needed. Patient used this medication for a headache.     . ALBUTEROL SULFATE HFA 108 (90 BASE) MCG/ACT IN AERS Inhalation Inhale 1-2 puffs into the lungs every 6 (six) hours as needed for wheezing. 1 Inhaler 0  . ONDANSETRON HCL 8 MG PO TABS Oral Take 8 mg by mouth daily.      Marland Kitchen PRENATA PO Oral Take 1 tablet by mouth daily.        BP 94/59  Pulse 82  Temp(Src) 98.8 F (37.1 C) (Oral)  Resp 18  Ht 5\' 3"  (1.6 m)  Wt 115 lb (52.164 kg)  BMI 20.37 kg/m2  SpO2 100%  Physical Exam 1855: Physical examination:  Nursing notes reviewed; Vital signs and O2 SAT reviewed;  Constitutional: Well developed, Well nourished, Well hydrated, In no acute distress; Head:  Normocephalic, atraumatic; Eyes: EOMI, PERRL, No scleral icterus; ENMT: TM's clear  bilat, +edemetous nasal turbinates bilat with clear rhinorrhea. Mouth and pharynx normal, Mucous membranes moist; Neck: Supple, Full range of motion, No lymphadenopathy; Cardiovascular: Regular rate and rhythm, No murmur, rub, or gallop; Respiratory: Breath sounds clear & equal bilaterally, No rales, rhonchi, wheezes.  Normal respiratory effort/excursion; Chest: Nontender, Movement normal; Abdomen: +gravid. Soft, Nontender, Nondistended, Normal bowel sounds; Genitourinary: No CVA tenderness; Extremities: Pulses normal, No tenderness, No edema, No calf edema or asymmetry.; Neuro: AA&Ox3, Major CN grossly intact. Speech clear, no facial droop.   No gross focal motor or sensory deficits in extremities.; Skin: Color normal, Warm, Dry, no rash.    ED Course  Procedures    MDM  MDM Reviewed: nursing note and vitals   9:25 PM:  Feels better after neb and wants to go home now.  Sats remain 99% R/A, resps easy, lungs CTA bilat, no wheezing.  Likely bronchitis/URI at this time.  No wheezing, will not start steroids for now.  Normal O2 Sats without tachycardia or fevers, doubt pneumonia or PE at this time.  States she needs another MDI.  Will dose here.  Wants to go home now.  Dx d/w pt and family.  Questions answered.  Verb understanding, agreeable to d/c home with outpt f/u.     Mendy Lapinsky Allison Quarry, DO 01/24/11 2307

## 2011-01-23 NOTE — ED Notes (Signed)
Respiratory paged for MDI at this time.

## 2011-01-23 NOTE — ED Notes (Signed)
Respiratory paged for a breathing treatment at this time.  

## 2011-01-23 NOTE — ED Notes (Signed)
Pt given discharge instructions, paperwork, pt verbalized understanding.   

## 2011-01-23 NOTE — ED Notes (Signed)
Pt c/o feeling sob that started this am

## 2011-01-23 NOTE — ED Notes (Signed)
Pt states feels sob. Some relief from MDI. Also reports productive cough. Denies any fever.

## 2011-01-23 NOTE — ED Notes (Signed)
Pt states breathing is better at this time. Breath sounds remain clear w/ good air movement.

## 2011-02-06 ENCOUNTER — Inpatient Hospital Stay (HOSPITAL_COMMUNITY)
Admission: AD | Admit: 2011-02-06 | Discharge: 2011-02-07 | Disposition: A | Discharge status: Home / Self Care | Payer: Medicaid Other | Source: Ambulatory Visit | Attending: Obstetrics & Gynecology | Admitting: Obstetrics & Gynecology

## 2011-02-06 DIAGNOSIS — O4700 False labor before 37 completed weeks of gestation, unspecified trimester: Secondary | ICD-10-CM | POA: Insufficient documentation

## 2011-02-06 DIAGNOSIS — O479 False labor, unspecified: Secondary | ICD-10-CM

## 2011-02-06 HISTORY — DX: Bipolar disorder, unspecified: F31.9

## 2011-02-06 HISTORY — DX: Anxiety disorder, unspecified: F41.9

## 2011-02-06 NOTE — Progress Notes (Signed)
Pt states, " I started having contractions at 1:30 pm. They are not going away, and they have gotten stronger and 3-5 min apart."

## 2011-02-07 ENCOUNTER — Encounter (HOSPITAL_COMMUNITY): Payer: Self-pay | Admitting: Obstetrics and Gynecology

## 2011-02-07 LAB — URINALYSIS, ROUTINE W REFLEX MICROSCOPIC
Bilirubin Urine: NEGATIVE
Hgb urine dipstick: NEGATIVE
Specific Gravity, Urine: <1.005 — ABNORMAL LOW (ref 1.005–1.030)
Urobilinogen, UA: 0.2 mg/dL (ref 0.0–1.0)

## 2011-02-07 LAB — WET PREP, GENITAL
Clue Cells Wet Prep HPF POC: NONE SEEN
Trich, Wet Prep: NONE SEEN

## 2011-02-07 LAB — URINE MICROSCOPIC-ADD ON

## 2011-02-07 NOTE — ED Provider Notes (Addendum)
History     Chief Complaint  Patient presents with  . Contractions   HPI This is a 25 [id number] at 22 weeks and 3 days who presents to the MAU with contractions that started around 3pm this afternoon.  The contractions have gotten stronger throughout the course of the day.  Feeling them about 6-7 minutes apart.  Tried to drink water, laying down without success.  Describes contractions as tightening.  Denies uterine tenderness, vaginal bleeding, vaginal discharge.  Feeling baby move a lot.  Had intercourse 2 days ago (wednesday night).    OB History    Grav Para Term Preterm Abortions TAB SAB Ect Mult Living   4 2  2 1  1   2       Past Medical History  Diagnosis Date  . Asthma   . Bronchitis   . Seasonal allergies   . Depression   . Anxiety   . Bipolar 1 disorder     Past Surgical History  Procedure Date  . Dilation and curettage of uterus     Family History  Problem Relation Age of Onset  . Hypertension Mother   . Hypertension Father     History  Substance Use Topics  . Smoking status: Former Smoker -- 1.5 packs/day for 11 years    Types: Cigarettes    Quit date: 12/01/2010  . Smokeless tobacco: Never Used  . Alcohol Use: No    Allergies:  Allergies  Allergen Reactions  . Amoxicillin Hives and Other (See Comments)    Swells throat close  . Other Hives and Rash    SPICY FOOD  . Penicillins Hives, Itching and Rash    Prescriptions prior to admission  Medication Sig Dispense Refill  . acetaminophen (TYLENOL) 500 MG tablet Take 500 mg by mouth every 6 (six) hours as needed. Patient used this medication for a headache.       . albuterol (PROVENTIL HFA;VENTOLIN HFA) 108 (90 BASE) MCG/ACT inhaler Inhale 1-2 puffs into the lungs every 6 (six) hours as needed for wheezing.  1 Inhaler  0  . diphenhydrAMINE (BENADRYL) 12.5 MG/5ML elixir Take 25 mg by mouth 4 (four) times daily as needed.        . ondansetron (ZOFRAN) 8 MG tablet Take 8 mg by mouth daily.        .  prenatal vitamin w/FE, FA (PRENATAL 1 + 1) 27-1 MG TABS Take 1 tablet by mouth daily.          ROS Physical Exam   Blood pressure 98/60, pulse 97, temperature 98.7 F (37.1 C), temperature source Oral, resp. rate 20, height 5\' 3"  (1.6 m), weight 53.524 kg (118 lb).  Physical Exam  Constitutional: She appears well-developed and well-nourished.  HENT:  Head: Normocephalic.  Eyes: Pupils are equal, round, and reactive to light.  Neck: Normal range of motion.  Cardiovascular: Normal rate and regular rhythm.   Respiratory: Effort normal.  GI: Soft. Bowel sounds are normal. She exhibits no distension and no mass. There is no tenderness. There is no rebound and no guarding.   Urinalysis: trace leukocyte esterace, no nitrite, Specific gravity < 1.005. Wet prep: negative for yeast, bv, trichomonas  Tocometry: Contractions approximately every 3-5 minutes, gradually decreased.  MAU Course  Procedures  MDM OB history reassuring as preterm birth occurred at 36 weeks.  Contractions gradually dissipated while in the MAU.  No evidence of bladder or vaginal infection.  GC/Chlamydia collected and pending.  Patient sent home with resolution  of contractions.    Assessment and Plan  72.  25 year old [id number] at 22 weeks and 3 days 2.  Preterm contractions - no cervical change.  No cervical change.  Patient to call [facility] on Monday for appointment.  Janayla Marik JEHIEL 02/07/2011, 1:07 AM

## 2011-02-09 LAB — GC/CHLAMYDIA PROBE AMP, GENITAL
Chlamydia, DNA Probe: NEGATIVE
GC Probe Amp, Genital: NEGATIVE

## 2011-03-03 DIAGNOSIS — Z9289 Personal history of other medical treatment: Secondary | ICD-10-CM

## 2011-03-03 HISTORY — DX: Personal history of other medical treatment: Z92.89

## 2011-03-03 NOTE — L&D Delivery Note (Signed)
Delivery Note At 1:49 AM a viable female was delivered via Vaginal, Spontaneous Delivery (Presentation:LOA ;  ).  APGAR:9 9 , ; weight 6 lb 10.5 oz (3020 g).   Placenta status: Intact, Spontaneous.  Cord:3 vc  with the following complications: .   Anesthesia: none Episiotomy: none Lacerations: none Suture Repair: none Est. Blood Loss400 (mL):   Mom to postpartum.  Baby to nursery-stable.  Kayliana Codd [name] 06/01/2011, 2:11 AM

## 2011-03-24 LAB — RPR: RPR: NONREACTIVE

## 2011-03-24 LAB — HEPATITIS B SURFACE ANTIGEN: Hepatitis B Surface Ag: NEGATIVE

## 2011-03-24 LAB — ANTIBODY SCREEN: Antibody Screen: NEGATIVE

## 2011-03-24 LAB — HIV ANTIBODY (ROUTINE TESTING W REFLEX): HIV: NONREACTIVE

## 2011-05-15 ENCOUNTER — Inpatient Hospital Stay (HOSPITAL_COMMUNITY)
Admission: AD | Admit: 2011-05-15 | Discharge: 2011-05-15 | Disposition: A | Discharge status: Home / Self Care | Payer: Medicaid Other | Source: Ambulatory Visit | Attending: Obstetrics & Gynecology | Admitting: Obstetrics & Gynecology

## 2011-05-15 ENCOUNTER — Encounter (HOSPITAL_COMMUNITY): Payer: Self-pay | Admitting: *Deleted

## 2011-05-15 DIAGNOSIS — O479 False labor, unspecified: Secondary | ICD-10-CM | POA: Insufficient documentation

## 2011-05-15 NOTE — Discharge Instructions (Signed)

## 2011-05-15 NOTE — MAU Note (Signed)
Pt in for labor eval, reports ucs x 1 hour.  Denies any bleeding or gushes of fluid.  Reports a large amount of discharge, but states this has been throughout pregnancy.  + FM.

## 2011-05-15 NOTE — MAU Provider Note (Signed)
History     CSN: [id number]  Arrival date and time: 05/15/11 1222   First Provider Initiated Contact with Patient 05/15/11 1316   Norma Kim is a 26 yo [id number] at 35w 6d presenting with contractions q 2-55mins that started about 12:00 today.  She describes the contractions as sharp pelvic pain with overall abdominal tightness.  She feels the contractions are similar to the contractions she had with her previous pregnancies.  She also c/o pelvic pressure; "I feel her head pushing down."  She has felt the baby move in the last hour.  She denies leak or gush of fluid, vaginal bleeding, or changes in vaginal discharge.  She stated she has had whitish discharge throughout her pregnancy.  She had two previous preterm SVD at 32 wks and 34 wks.  She is seen at [facility] for her prenatal care.  She states she was told she was dilated to 1 cm last week in the office.  Chief Complaint  Patient presents with  . Labor Eval   HPI Norma Kim is a 26 yo [id number] at 35w 6d presenting with contractions.  OB History    Grav Para Term Preterm Abortions TAB SAB Ect Mult Living   4 2  2 1  1   2       Past Medical History  Diagnosis Date  . Asthma   . Bronchitis   . Seasonal allergies   . Depression   . Anxiety   . Bipolar 1 disorder     Past Surgical History  Procedure Date  . Dilation and curettage of uterus     Family History  Problem Relation Age of Onset  . Hypertension Mother   . Hypertension Father   . Anesthesia problems Neg Hx     History  Substance Use Topics  . Smoking status: Former Smoker -- 1.5 packs/day for 11 years    Types: Cigarettes    Quit date: 12/01/2010  . Smokeless tobacco: Never Used  . Alcohol Use: No    Allergies:  Allergies  Allergen Reactions  . Amoxicillin Hives and Other (See Comments)    Swells throat close  . Other Hives and Rash    SPICY FOOD  . Penicillins Hives, Itching and Rash    Prescriptions prior to admission  Medication Sig  Dispense Refill  . acetaminophen (TYLENOL) 500 MG tablet Take 500 mg by mouth every 6 (six) hours as needed. Patient used this medication for a headache.       . albuterol (PROVENTIL HFA;VENTOLIN HFA) 108 (90 BASE) MCG/ACT inhaler Inhale 1-2 puffs into the lungs every 6 (six) hours as needed for wheezing.  1 Inhaler  0  . diphenhydrAMINE (BENADRYL) 12.5 MG/5ML elixir Take 25 mg by mouth 4 (four) times daily as needed. For allergies      . ondansetron (ZOFRAN) 8 MG tablet Take 8 mg by mouth daily.        Marland Kitchen Phenylephrine-Acetaminophen 5-325 MG TABS Take 1 tablet by mouth every 6 (six) hours as needed. For allergies      . prenatal vitamin w/FE, FA (PRENATAL 1 + 1) 27-1 MG TABS Take 1 tablet by mouth daily.          Review of Systems  Gastrointestinal: Positive for abdominal pain (uterine contractions).   Physical Exam   Blood pressure 108/69, pulse 106, temperature 98.2 F (36.8 C), temperature source Oral, resp. rate 18, height 5\' 3"  (1.6 m), weight 58.06 kg (128 lb).  Physical Exam  Constitutional: She is oriented to person, place, and time. She appears well-developed and well-nourished.  HENT:  Head: Normocephalic and atraumatic.  Cardiovascular: Normal rate and regular rhythm.   Respiratory: Effort normal.  GI: She exhibits distension (gravida).  Musculoskeletal: Normal range of motion.  Neurological: She is alert and oriented to person, place, and time.  Pelvic: External genitalia with normal hair distributions, no lesions noted. Vagina pink with creamy white discharge, no lesions noted.  Cervix: 3-4/50/-3  Fetal FH- 130 NST - reassuring with moderate variability, accelerations, no decelerations  MAU Course  Procedures   Assessment and Plan  26 yo [id number] at 35w 6d presenting in early labor  After discussing patient with Deirdre Poe CNM, patient to walk for an hour then re-examined.  Mardene Speak 05/15/2011, 1:56 PM   I assumed care of the patient.  Patient comfortable  with contractions. Cervical exam after 3 hours of observation.   Dilation: 4 Effacement (%): 50 Cervical Position: Middle Station: -2 Presentation: Vertex Exam by:: Dr. Geni Bers patient option to either stay for observation or discharge to home with explicit instructions to return if contractions become more frequent or more intense.  Patient comfortable with plan.  Candelaria Celeste JEHIEL 05/15/2011 4:40 PM

## 2011-05-29 ENCOUNTER — Inpatient Hospital Stay (HOSPITAL_COMMUNITY)
Admission: AD | Admit: 2011-05-29 | Discharge: 2011-05-29 | Disposition: A | Discharge status: Home / Self Care | Payer: Medicaid Other | Source: Ambulatory Visit | Attending: Obstetrics & Gynecology | Admitting: Obstetrics & Gynecology

## 2011-05-29 ENCOUNTER — Encounter (HOSPITAL_COMMUNITY): Payer: Self-pay | Admitting: *Deleted

## 2011-05-29 DIAGNOSIS — O479 False labor, unspecified: Secondary | ICD-10-CM

## 2011-05-29 DIAGNOSIS — O4690 Antepartum hemorrhage, unspecified, unspecified trimester: Secondary | ICD-10-CM | POA: Insufficient documentation

## 2011-05-29 DIAGNOSIS — O471 False labor at or after 37 completed weeks of gestation: Secondary | ICD-10-CM

## 2011-05-29 DIAGNOSIS — Z3490 Encounter for supervision of normal pregnancy, unspecified, unspecified trimester: Secondary | ICD-10-CM

## 2011-05-29 LAB — URINALYSIS, ROUTINE W REFLEX MICROSCOPIC
Bilirubin Urine: NEGATIVE
Specific Gravity, Urine: 1.01 (ref 1.005–1.030)
pH: 7.5 (ref 5.0–8.0)

## 2011-05-29 LAB — URINE MICROSCOPIC-ADD ON

## 2011-05-29 MED ORDER — ZOLPIDEM TARTRATE 10 MG PO TABS: 10.0000 mg | ORAL_TABLET | Freq: Every evening | ORAL | Status: DC | PRN

## 2011-05-29 MED ORDER — ZOLPIDEM TARTRATE 10 MG PO TABS
10.0000 mg | ORAL_TABLET | Freq: Every evening | ORAL | Status: DC | PRN
  Administered 2011-05-29: 10 mg via ORAL

## 2011-05-29 NOTE — MAU Note (Signed)
Went to the bathroom around 3:00 and noticed was spotting blood, no history of previa, no recent intercourse, G3P2 [facility] 4/13

## 2011-05-29 NOTE — OB Triage Provider Note (Signed)
History     CSN: [id number]  Arrival date and time: 05/29/11 1724   None     Chief Complaint  Patient presents with  . Vaginal Bleeding   Vaginal Bleeding Pertinent negatives include no fever or headaches.  Patient presents for labor check and vaginal bleeding.  She says she started feeling contractions this afternoon around 3:30 pm.  She had some clear vaginal discharge and there was some blood, but no clots.  She says she has also felt like the baby is pushing into her pelvis now.  She denies a gush of watery fluid.    OB History    Grav Para Term Preterm Abortions TAB SAB Ect Mult Living   4 2  2 1  1   2       Past Medical History  Diagnosis Date  . Asthma   . Bronchitis   . Seasonal allergies   . Depression   . Anxiety   . Bipolar 1 disorder     Past Surgical History  Procedure Date  . Dilation and curettage of uterus     Family History  Problem Relation Age of Onset  . Hypertension Mother   . Hypertension Father   . Anesthesia problems Neg Hx     History  Substance Use Topics  . Smoking status: Former Smoker -- 1.5 packs/day for 11 years    Types: Cigarettes    Quit date: 12/01/2010  . Smokeless tobacco: Never Used  . Alcohol Use: No    Allergies:  Allergies  Allergen Reactions  . Amoxicillin Hives and Other (See Comments)    Swells throat close  . Other Hives and Rash    SPICY FOOD  . Penicillins Hives, Itching and Rash    Prescriptions prior to admission  Medication Sig Dispense Refill  . acetaminophen (TYLENOL) 500 MG tablet Take 500 mg by mouth every 6 (six) hours as needed. Patient used this medication for a headache.       . albuterol (PROVENTIL HFA;VENTOLIN HFA) 108 (90 BASE) MCG/ACT inhaler Inhale 1-2 puffs into the lungs every 6 (six) hours as needed for wheezing.  1 Inhaler  0  . diphenhydrAMINE (BENADRYL) 12.5 MG/5ML elixir Take 25 mg by mouth 4 (four) times daily as needed. For allergies      . ondansetron (ZOFRAN) 8 MG tablet  Take 8 mg by mouth daily.        Marland Kitchen Phenylephrine-Acetaminophen 5-325 MG TABS Take 1 tablet by mouth every 6 (six) hours as needed. For allergies      . prenatal vitamin w/FE, FA (PRENATAL 1 + 1) 27-1 MG TABS Take 1 tablet by mouth daily.          Review of Systems  Constitutional: Negative for fever.  Eyes: Negative for double vision.  Respiratory: Negative for shortness of breath.   Cardiovascular: Negative for chest pain.  Genitourinary: Positive for vaginal bleeding.  Neurological: Negative for headaches.   Physical Exam   Blood pressure 103/61, pulse 96, temperature 97.8 F (36.6 C), temperature source Oral, resp. rate 18, height 5\' 3"  (1.6 m), weight 57.425 kg (126 lb 9.6 oz).  Physical Exam  Constitutional: She is oriented to person, place, and time. She appears well-developed and well-nourished.  HENT:  Head: Normocephalic and atraumatic.  Eyes: Pupils are equal, round, and reactive to light.  Neck: No JVD present.  Cardiovascular: Normal rate and regular rhythm.   Respiratory: Effort normal and breath sounds normal.  GI:  Gravid, non-tender.   Genitourinary:       Sterile speculum exam performed, thin white discharge present, no fluid pooling, no blood in cervix or vaginal vault.   Neurological: She is alert and oriented to person, place, and time.   Dilation: 5 Effacement (%): 70 Cervical Position: Middle Station: -2 Presentation: Vertex Exam by:: Lula Olszewski  FHT: Baseline140, Category I tracing Toco: Irregular MAU Course  Procedures  MDM No signs of ROM, but patient possibly in early labor.  Will have her ambulate for an hour and re-check cervix.   Assessment and Plan    CHAMBERLAIN,RACHEL 05/29/2011, 6:52 PM   Cx recheck per RN: no change after ambulation. Pt has been observed for 5 hrs, is fatigued and elects to have Ambien and be discharged home. Rx Ambien 10 mg #5.  Rodolph Hagemann 05/29/2011 9:21 PM

## 2011-05-29 NOTE — Progress Notes (Signed)
Discharge instructions reviewed and papers signed, prescription for [facility] given, d/c'd ambulatory with friend

## 2011-06-01 ENCOUNTER — Encounter (HOSPITAL_COMMUNITY): Payer: Self-pay | Admitting: Obstetrics

## 2011-06-01 ENCOUNTER — Inpatient Hospital Stay (HOSPITAL_COMMUNITY)
Admission: AD | Admit: 2011-06-01 | Discharge: 2011-06-03 | DRG: 775 | Disposition: A | Discharge status: Home / Self Care | Payer: Medicaid Other | Attending: Obstetrics & Gynecology | Admitting: Obstetrics & Gynecology

## 2011-06-01 LAB — CBC
Hemoglobin: 11.4 g/dL — ABNORMAL LOW (ref 12.0–15.0)
MCH: 34.7 pg — ABNORMAL HIGH (ref 26.0–34.0)
Platelets: 203 10*3/uL (ref 150–400)
RBC: 3.29 MIL/uL — ABNORMAL LOW (ref 3.87–5.11)
WBC: 16.7 10*3/uL — ABNORMAL HIGH (ref 4.0–10.5)

## 2011-06-01 LAB — RPR: RPR Ser Ql: NONREACTIVE

## 2011-06-01 LAB — RAPID URINE DRUG SCREEN, HOSP PERFORMED
Amphetamines: NOT DETECTED
Barbiturates: NOT DETECTED
Benzodiazepines: NOT DETECTED
Cocaine: NOT DETECTED
Opiates: NOT DETECTED
Tetrahydrocannabinol: NOT DETECTED

## 2011-06-01 MED ORDER — TETANUS-DIPHTH-ACELL PERTUSSIS 5-2.5-18.5 LF-MCG/0.5 IM SUSP
0.5000 mL | Freq: Once | INTRAMUSCULAR | Status: AC
  Administered 2011-06-03: 0.5 mL via INTRAMUSCULAR
  Filled 2011-06-01: qty 0.5

## 2011-06-01 MED ORDER — OXYCODONE-ACETAMINOPHEN 5-325 MG PO TABS
1.0000 | ORAL_TABLET | ORAL | Status: DC | PRN
  Administered 2011-06-01 – 2011-06-03 (×3): 1 via ORAL
  Filled 2011-06-01 (×3): qty 1

## 2011-06-01 MED ORDER — OXYCODONE-ACETAMINOPHEN 5-325 MG PO TABS: 1.0000 | ORAL_TABLET | ORAL | Status: DC | PRN

## 2011-06-01 MED ORDER — PRENATAL MULTIVITAMIN CH
1.0000 | ORAL_TABLET | Freq: Every day | ORAL | Status: DC
  Administered 2011-06-01 – 2011-06-02 (×2): 1 via ORAL
  Filled 2011-06-01 (×2): qty 1

## 2011-06-01 MED ORDER — LACTATED RINGERS IV SOLN: 500.0000 mL | INTRAVENOUS | Status: DC | PRN

## 2011-06-01 MED ORDER — FLEET ENEMA 7-19 GM/118ML RE ENEM: 1.0000 | ENEMA | RECTAL | Status: DC | PRN

## 2011-06-01 MED ORDER — IBUPROFEN 600 MG PO TABS
600.0000 mg | ORAL_TABLET | Freq: Four times a day (QID) | ORAL | Status: DC
  Administered 2011-06-01 – 2011-06-03 (×8): 600 mg via ORAL
  Filled 2011-06-01 (×8): qty 1

## 2011-06-01 MED ORDER — WITCH HAZEL-GLYCERIN EX PADS: 1.0000 "application " | MEDICATED_PAD | CUTANEOUS | Status: DC | PRN

## 2011-06-01 MED ORDER — OXYTOCIN 20 UNITS IN LACTATED RINGERS INFUSION - SIMPLE
INTRAVENOUS | Status: AC
  Filled 2011-06-01: qty 1000

## 2011-06-01 MED ORDER — LACTATED RINGERS IV SOLN: INTRAVENOUS | Status: DC

## 2011-06-01 MED ORDER — OXYTOCIN BOLUS FROM INFUSION
500.0000 mL | Freq: Once | INTRAVENOUS | Status: DC
  Filled 2011-06-01: qty 500

## 2011-06-01 MED ORDER — CITRIC ACID-SODIUM CITRATE 334-500 MG/5ML PO SOLN: 30.0000 mL | ORAL | Status: DC | PRN

## 2011-06-01 MED ORDER — LIDOCAINE HCL (PF) 1 % IJ SOLN
INTRAMUSCULAR | Status: AC
  Filled 2011-06-01: qty 30

## 2011-06-01 MED ORDER — ALBUTEROL SULFATE HFA 108 (90 BASE) MCG/ACT IN AERS
1.0000 | INHALATION_SPRAY | Freq: Four times a day (QID) | RESPIRATORY_TRACT | Status: DC | PRN
  Filled 2011-06-01: qty 6.7

## 2011-06-01 MED ORDER — OXYTOCIN 10 UNIT/ML IJ SOLN
INTRAMUSCULAR | Status: AC
  Administered 2011-06-01: 20 [IU] via INTRAMUSCULAR
  Filled 2011-06-01: qty 2

## 2011-06-01 MED ORDER — PRENATAL MULTIVITAMIN CH
1.0000 | ORAL_TABLET | Freq: Every day | ORAL | Status: DC
  Administered 2011-06-01 – 2011-06-03 (×2): 1 via ORAL
  Filled 2011-06-01: qty 1

## 2011-06-01 MED ORDER — OXYTOCIN 20 UNITS IN LACTATED RINGERS INFUSION - SIMPLE: 125.0000 mL/h | Freq: Once | INTRAVENOUS | Status: DC

## 2011-06-01 MED ORDER — IBUPROFEN 600 MG PO TABS
600.0000 mg | ORAL_TABLET | Freq: Four times a day (QID) | ORAL | Status: DC | PRN
  Administered 2011-06-01: 600 mg via ORAL
  Filled 2011-06-01: qty 1

## 2011-06-01 MED ORDER — ONDANSETRON HCL 4 MG/2ML IJ SOLN: 4.0000 mg | INTRAMUSCULAR | Status: DC | PRN

## 2011-06-01 MED ORDER — LIDOCAINE HCL (PF) 1 % IJ SOLN: 30.0000 mL | INTRAMUSCULAR | Status: DC | PRN

## 2011-06-01 MED ORDER — SIMETHICONE 80 MG PO CHEW: 80.0000 mg | CHEWABLE_TABLET | ORAL | Status: DC | PRN

## 2011-06-01 MED ORDER — DIPHENHYDRAMINE HCL 25 MG PO CAPS: 25.0000 mg | ORAL_CAPSULE | Freq: Four times a day (QID) | ORAL | Status: DC | PRN

## 2011-06-01 MED ORDER — ACETAMINOPHEN 325 MG PO TABS: 650.0000 mg | ORAL_TABLET | ORAL | Status: DC | PRN

## 2011-06-01 MED ORDER — BENZOCAINE-MENTHOL 20-0.5 % EX AERO: 1.0000 "application " | INHALATION_SPRAY | CUTANEOUS | Status: DC | PRN

## 2011-06-01 MED ORDER — OXYTOCIN 10 UNIT/ML IJ SOLN
20.0000 [IU] | Freq: Once | INTRAMUSCULAR | Status: AC
  Administered 2011-06-01: 20 [IU] via INTRAMUSCULAR

## 2011-06-01 MED ORDER — LANOLIN HYDROUS EX OINT: TOPICAL_OINTMENT | CUTANEOUS | Status: DC | PRN

## 2011-06-01 MED ORDER — ZOLPIDEM TARTRATE 5 MG PO TABS: 5.0000 mg | ORAL_TABLET | Freq: Every evening | ORAL | Status: DC | PRN

## 2011-06-01 MED ORDER — SENNOSIDES-DOCUSATE SODIUM 8.6-50 MG PO TABS
2.0000 | ORAL_TABLET | Freq: Every day | ORAL | Status: DC
  Administered 2011-06-01 – 2011-06-02 (×2): 2 via ORAL

## 2011-06-01 MED ORDER — ONDANSETRON HCL 4 MG/2ML IJ SOLN: 4.0000 mg | Freq: Four times a day (QID) | INTRAMUSCULAR | Status: DC | PRN

## 2011-06-01 MED ORDER — DIBUCAINE 1 % RE OINT: 1.0000 "application " | TOPICAL_OINTMENT | RECTAL | Status: DC | PRN

## 2011-06-01 MED ORDER — ONDANSETRON HCL 4 MG PO TABS: 4.0000 mg | ORAL_TABLET | ORAL | Status: DC | PRN

## 2011-06-01 NOTE — H&P (Signed)
Norma Kim is a 26 y.o. female presenting for impending delivery. Maternal Medical History:  Reason for admission: Reason for admission: rupture of membranes and contractions.  Contractions: Onset was 3-5 hours ago.   Frequency: regular.    Fetal activity: Perceived fetal activity is decreased.   Last perceived fetal movement was within the past hour.      OB History    Grav Para Term Preterm Abortions TAB SAB Ect Mult Living   4 2  2 1  1   2      Past Medical History  Diagnosis Date  . Asthma   . Bronchitis   . Seasonal allergies   . Depression   . Anxiety   . Bipolar 1 disorder    Past Surgical History  Procedure Date  . Dilation and curettage of uterus    Family History: family history includes Hypertension in her father and mother.  There is no history of Anesthesia problems. Social History:  reports that she quit smoking about 5 months ago. Her smoking use included Cigarettes. She has a 16.5 pack-year smoking history. She has never used smokeless tobacco. She reports that she does not drink alcohol or use illicit drugs.  Review of Systems  Constitutional: Negative.   HENT: Negative.   Eyes: Negative.   Respiratory: Negative.   Cardiovascular: Negative.   Gastrointestinal: Negative.   Genitourinary: Negative.   Musculoskeletal: Negative.   Skin: Negative.   Neurological: Negative.   Endo/Heme/Allergies: Negative.   Psychiatric/Behavioral: Negative.     Dilation: 8.5 Effacement (%): 90 Station: 0 Exam by:: Virginai Smith,CNM Blood pressure 93/61, pulse 104, resp. rate 18. Maternal Exam:  Uterine Assessment: Contraction strength is firm.  Contraction frequency is regular.   Abdomen: Patient reports no abdominal tenderness. Fetal presentation: vertex  Introitus: Normal vulva. Normal vagina.    Physical Exam  Constitutional: She is oriented to person, place, and time. She appears well-developed and well-nourished.  HENT:  Head: Normocephalic.    Cardiovascular: Normal rate, regular rhythm, normal heart sounds and intact distal pulses.   Respiratory: Effort normal and breath sounds normal.  GI: Soft. Bowel sounds are normal.  Genitourinary: Vagina normal and uterus normal.  Musculoskeletal: Normal range of motion.  Neurological: She is alert and oriented to person, place, and time. She has normal reflexes.  Skin: Skin is warm and dry.  Psychiatric: She has a normal mood and affect. Her behavior is normal. Judgment and thought content normal.    Prenatal labs: ABO, Rh: --/--/A POS (08/15 2314) Antibody:   Rubella:   RPR:    HBsAg:    HIV:    GBS:     Assessment/Plan: Impending delivery   Norma Kim Norma Kim 06/01/2011, 2:06 AM
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
## 2011-06-01 NOTE — Progress Notes (Signed)
UR chart review completed.  
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
## 2011-06-02 NOTE — Progress Notes (Signed)
Post Partum Day #1 Subjective: no complaints and up ad lib; bottlefeeding; contraception not discussed due to multiple family members in room; rev'd SW note which approves infant to go home with pt (other 2 children not in her custody) and SW has tried to contact prev CPS records  Objective: Blood pressure 92/57, pulse 76, temperature 97.8 F (36.6 C), temperature source Oral, resp. rate 18, height 5\' 3"  (1.6 m), weight 58.06 kg (128 lb), SpO2 97.00%, unknown if currently breastfeeding.  Physical Exam:  General: alert, cooperative and no distress Lochia: appropriate Uterine Fundus: firm    Basename 06/01/11 0235  HGB 11.4*  HCT 33.1*    Assessment/Plan: Plan for discharge tomorrow  Will have pt f/u with [facility] in 1-2 wks post del due to history of depression/bipolar   LOS: 1 day   Norma Kim 06/02/2011, 10:59 AM

## 2011-06-03 MED ORDER — IBUPROFEN 600 MG PO TABS: 600.0000 mg | ORAL_TABLET | Freq: Four times a day (QID) | ORAL | Status: AC

## 2011-06-03 NOTE — Progress Notes (Signed)
I have seen and examined this patient and I agree with the above. Cam Hai 8:15 AM 06/03/2011

## 2011-06-03 NOTE — Discharge Summary (Signed)
Obstetric Discharge Summary Reason for Admission: onset of labor Prenatal Procedures: NST and ultrasound Intrapartum Procedures: spontaneous vaginal delivery Postpartum Procedures: none Complications-Operative and Postpartum: none Hemoglobin  Date Value Range Status  06/01/2011 11.4* 12.0-15.0 (g/dL) Final     HCT  Date Value Range Status  06/01/2011 33.1* 36.0-46.0 (%) Final    Physical Exam:  General: alert, cooperative and appears stated age Lochia: appropriate Uterine Fundus: firm DVT Evaluation: No evidence of DVT seen on physical exam. Negative Homan's sign. No cords or calf tenderness. No significant calf/ankle edema.  Discharge Diagnoses: Term Pregnancy-delivered  Discharge Information: Date: 06/03/2011 Activity: unrestricted and pelvic rest Diet: routine Medications: Ibuprofen Condition: stable Instructions: refer to practice specific booklet Discharge to: home   Newborn Data: Live born female  Birth Weight: 6 lb 10.5 oz (3020 g) APGAR: 8, 9  Home with mother.  Norma Kim 06/03/2011, 8:58 AM

## 2011-06-03 NOTE — Progress Notes (Signed)
Post Partum Day 2 Subjective: no complaints, up ad lib, voiding, tolerating PO, + flatus and unsure of which type of contraceptive at the moment. Bottlefeeding.   Objective: Blood pressure 100/64, pulse 77, temperature 98.1 F (36.7 C), temperature source Oral, resp. rate 18, height 5\' 3"  (1.6 m), weight 58.06 kg (128 lb), SpO2 98.00%, unknown if currently breastfeeding.  Physical Exam:  General: alert, cooperative and appears stated age Lochia: appropriate Uterine Fundus: firm DVT Evaluation: No evidence of DVT seen on physical exam. Negative Homan's sign. No cords or calf tenderness. No significant calf/ankle edema.   Basename 06/01/11 0235  HGB 11.4*  HCT 33.1*    Assessment/Plan: Discharge home and Contraception unsure at this time of what type.    LOS: 2 days   [name] 06/03/2011, 7:38 AM

## 2011-06-10 ENCOUNTER — Encounter (INDEPENDENT_AMBULATORY_CARE_PROVIDER_SITE_OTHER): Payer: Medicaid Other | Admitting: Physician Assistant

## 2011-06-10 DIAGNOSIS — Z3480 Encounter for supervision of other normal pregnancy, unspecified trimester: Secondary | ICD-10-CM

## 2011-06-10 LAB — POCT URINALYSIS DIP (DEVICE)
Bilirubin Urine: NEGATIVE
Glucose, UA: NEGATIVE mg/dL
Ketones, ur: NEGATIVE mg/dL
Nitrite: NEGATIVE
Protein, ur: NEGATIVE mg/dL
Specific Gravity, Urine: <=1.005 (ref 1.005–1.030)
Urobilinogen, UA: 0.2 mg/dL (ref 0.0–1.0)
pH: 5.5 (ref 5.0–8.0)

## 2011-07-06 ENCOUNTER — Ambulatory Visit: Payer: Medicaid Other | Admitting: Advanced Practice Midwife

## 2011-07-17 ENCOUNTER — Ambulatory Visit: Payer: Medicaid Other | Admitting: Obstetrics & Gynecology

## 2011-08-14 ENCOUNTER — Ambulatory Visit: Payer: Medicaid Other | Admitting: Advanced Practice Midwife

## 2011-09-11 ENCOUNTER — Emergency Department (HOSPITAL_COMMUNITY)
Admission: EM | Admit: 2011-09-11 | Discharge: 2011-09-12 | Disposition: A | Discharge status: Home / Self Care | Payer: Self-pay | Attending: Emergency Medicine | Admitting: Emergency Medicine

## 2011-09-11 ENCOUNTER — Encounter (HOSPITAL_COMMUNITY): Payer: Self-pay | Admitting: *Deleted

## 2011-09-11 DIAGNOSIS — IMO0001 Reserved for inherently not codable concepts without codable children: Secondary | ICD-10-CM | POA: Insufficient documentation

## 2011-09-11 DIAGNOSIS — R509 Fever, unspecified: Secondary | ICD-10-CM | POA: Insufficient documentation

## 2011-09-11 DIAGNOSIS — R112 Nausea with vomiting, unspecified: Secondary | ICD-10-CM | POA: Insufficient documentation

## 2011-09-11 DIAGNOSIS — N390 Urinary tract infection, site not specified: Secondary | ICD-10-CM

## 2011-09-11 LAB — URINALYSIS, ROUTINE W REFLEX MICROSCOPIC
Bilirubin Urine: NEGATIVE
Glucose, UA: 250 mg/dL — AB
Ketones, ur: NEGATIVE mg/dL
Nitrite: POSITIVE — AB
Protein, ur: 100 mg/dL — AB
pH: 6 (ref 5.0–8.0)

## 2011-09-11 LAB — CBC WITH DIFFERENTIAL/PLATELET
Basophils Relative: 0 % (ref 0–1)
Eosinophils Absolute: 0 10*3/uL (ref 0.0–0.7)
Eosinophils Relative: 0 % (ref 0–5)
Hemoglobin: 12.8 g/dL (ref 12.0–15.0)
Lymphs Abs: 1.5 10*3/uL (ref 0.7–4.0)
MCH: 30.8 pg (ref 26.0–34.0)
MCHC: 32.8 g/dL (ref 30.0–36.0)
MCV: 93.8 fL (ref 78.0–100.0)
Monocytes Relative: 8 % (ref 3–12)
Neutrophils Relative %: 78 % — ABNORMAL HIGH (ref 43–77)
RBC: 4.16 MIL/uL (ref 3.87–5.11)

## 2011-09-11 LAB — BASIC METABOLIC PANEL
BUN: 8 mg/dL (ref 6–23)
Calcium: 9.8 mg/dL (ref 8.4–10.5)
Creatinine, Ser: 0.6 mg/dL (ref 0.50–1.10)
GFR calc Af Amer: >90 mL/min (ref >90)
GFR calc non Af Amer: >90 mL/min (ref >90)
Glucose, Bld: 111 mg/dL — ABNORMAL HIGH (ref 70–99)

## 2011-09-11 LAB — URINE MICROSCOPIC-ADD ON

## 2011-09-11 MED ORDER — ONDANSETRON 8 MG PO TBDP
8.0000 mg | ORAL_TABLET | Freq: Once | ORAL | Status: AC
  Administered 2011-09-11: 8 mg via ORAL
  Filled 2011-09-11: qty 1

## 2011-09-11 MED ORDER — CIPROFLOXACIN HCL 500 MG PO TABS: 500.0000 mg | ORAL_TABLET | Freq: Two times a day (BID) | ORAL | Status: AC

## 2011-09-11 MED ORDER — IBUPROFEN 800 MG PO TABS
800.0000 mg | ORAL_TABLET | Freq: Once | ORAL | Status: AC
  Administered 2011-09-11: 800 mg via ORAL
  Filled 2011-09-11: qty 1

## 2011-09-11 MED ORDER — PROMETHAZINE HCL 25 MG PO TABS: 25.0000 mg | ORAL_TABLET | Freq: Four times a day (QID) | ORAL | Status: DC | PRN

## 2011-09-11 MED ORDER — SODIUM CHLORIDE 0.9 % IV BOLUS (SEPSIS)
1000.0000 mL | Freq: Once | INTRAVENOUS | Status: AC
  Administered 2011-09-11: 1000 mL via INTRAVENOUS

## 2011-09-11 MED ORDER — CIPROFLOXACIN IN D5W 400 MG/200ML IV SOLN
400.0000 mg | Freq: Once | INTRAVENOUS | Status: AC
  Administered 2011-09-11: 400 mg via INTRAVENOUS
  Filled 2011-09-11: qty 200

## 2011-09-11 NOTE — ED Notes (Signed)
Pt reports fever & vomiting for 2 days.

## 2011-09-11 NOTE — ED Notes (Signed)
Pt reports fever of 101 for last 3 days.  Reports drinking cold fluids to bring it down. Reports Tylenol every 4 hours. Also reporting headache.  Reporting general aches

## 2011-09-14 LAB — URINE CULTURE: Colony Count: >=100000

## 2011-09-14 NOTE — ED Provider Notes (Signed)
History     CSN: [id number]  Arrival date & time 09/11/11  1027   First MD Initiated Contact with Patient 09/11/11 2037      Chief Complaint  Patient presents with  . Fever    (Consider location/radiation/quality/duration/timing/severity/associated sxs/prior treatment) HPI Comments: Norma Kim presents with a 3 day history of fever to 101 degrees,  Myalgias and headache.  She also reports nausea with vomiting,  But has been able to maintain frequent small sips of water.  She has not had solid food since yesterday.  She denies cough,  Shortness of breath,  Sore throat or uri symptoms, abdominal pain , diarrhea, rash and dysuria.  She has taken tylenol for fever reduction which also helps her myalgias and headache,  Last dose taken this morning.    The history is provided by the patient.    Past Medical History  Diagnosis Date  . Asthma   . Bronchitis   . Seasonal allergies   . Depression   . Anxiety   . Bipolar 1 disorder     Past Surgical History  Procedure Date  . Dilation and curettage of uterus     Family History  Problem Relation Age of Onset  . Hypertension Mother   . Hypertension Father   . Anesthesia problems Neg Hx     History  Substance Use Topics  . Smoking status: Current Everyday Smoker -- 0.2 packs/day for 11 years    Types: Cigarettes    Last Attempt to Quit: 12/01/2010  . Smokeless tobacco: Never Used  . Alcohol Use: No    OB History    Grav Para Term Preterm Abortions TAB SAB Ect Mult Living   4 3 1 2 1  1   3       Review of Systems  Constitutional: Positive for fever.  HENT: Negative for congestion, sore throat, rhinorrhea, neck pain and sinus pressure.   Eyes: Negative.   Respiratory: Negative for cough, chest tightness and shortness of breath.   Cardiovascular: Negative for chest pain.  Gastrointestinal: Positive for nausea and vomiting. Negative for abdominal pain and diarrhea.  Genitourinary: Negative.  Negative for dysuria,  frequency, flank pain and vaginal discharge.  Musculoskeletal: Positive for myalgias. Negative for joint swelling and arthralgias.  Skin: Negative.  Negative for rash and wound.  Neurological: Negative for dizziness, weakness, light-headedness, numbness and headaches.  Hematological: Negative.   Psychiatric/Behavioral: Negative.     Allergies  Amoxicillin; Other; and Penicillins  Home Medications   Current Outpatient Rx  Name Route Sig Dispense Refill  . ACETAMINOPHEN 500 MG PO TABS Oral Take 500 mg by mouth every 6 (six) hours as needed. Patient used this medication for a headache.     . MULTI-VITAMIN/MINERALS PO TABS Oral Take 1 tablet by mouth daily.    Marland Kitchen CIPROFLOXACIN HCL 500 MG PO TABS Oral Take 1 tablet (500 mg total) by mouth 2 (two) times daily. 14 tablet 0  . PROMETHAZINE HCL 25 MG RE SUPP Rectal Place 1 suppository (25 mg total) rectally every 6 (six) hours as needed for nausea. 12 each 0  . PROMETHAZINE HCL 25 MG PO TABS Oral Take 1 tablet (25 mg total) by mouth every 6 (six) hours as needed for nausea. 12 tablet 0    BP 102/61  Pulse 105  Temp 100 F (37.8 C) (Oral)  Resp 18  Ht 5\' 3"  (1.6 m)  Wt 105 lb (47.628 kg)  BMI 18.60 kg/m2  SpO2 100%  LMP  09/11/2011  Physical Exam  Nursing note and vitals reviewed. Constitutional: She appears well-developed and well-nourished.  HENT:  Head: Normocephalic and atraumatic.  Eyes: Conjunctivae are normal.  Neck: Normal range of motion.  Cardiovascular: Normal rate, regular rhythm, normal heart sounds and intact distal pulses.   Pulmonary/Chest: Effort normal and breath sounds normal. She has no wheezes.  Abdominal: Soft. Bowel sounds are normal. There is no tenderness. There is no CVA tenderness.  Musculoskeletal: Normal range of motion.  Neurological: She is alert.  Skin: Skin is warm and dry.  Psychiatric: She has a normal mood and affect.    ED Course  Procedures (including critical care time)  Labs Reviewed    URINALYSIS, ROUTINE W REFLEX MICROSCOPIC - Abnormal; Notable for the following:    Color, Urine AMBER (*)  BIOCHEMICALS MAY BE AFFECTED BY COLOR   APPearance CLOUDY (*)     Glucose, UA 250 (*)     Hgb urine dipstick LARGE (*)     Protein, ur 100 (*)     Nitrite POSITIVE (*)     Leukocytes, UA SMALL (*)     All other components within normal limits  CBC WITH DIFFERENTIAL - Abnormal; Notable for the following:    WBC 11.3 (*)     Neutrophils Relative 78 (*)     Neutro Abs 8.8 (*)     All other components within normal limits  BASIC METABOLIC PANEL - Abnormal; Notable for the following:    Glucose, Bld 111 (*)     All other components within normal limits  URINE MICROSCOPIC-ADD ON - Abnormal; Notable for the following:    Bacteria, UA MANY (*)     All other components within normal limits  PREGNANCY, URINE  URINE CULTURE   No results found.   1. UTI (lower urinary tract infection)    Pt given IV fluids,  Ibuprofen and zofran.  She tolerated PO intake while in ed.   Discussed labs with pt including UA results.  Pt added that she was having dysuria earlier in the week but took some AZO as recommended by a pharmacist,  And this symptom was improving,  So didn't think it was significant info for [place] to know.  cipro 400 mg given IV prior to dc home.   MDM  Uti.  Prescribed cipro,  Phenergan.  Urine cx sent.  Pt given strict return instructions for worsened sx.  Rest,  Increase fluids,  Repeat UA after abx completed .          Burgess Amor, [place] 09/14/11 [phone]

## 2011-09-15 NOTE — ED Provider Notes (Signed)
Medical screening examination/treatment/procedure(s) were performed by non-physician practitioner and as supervising physician I was immediately available for consultation/collaboration. Kristell Wooding, MD, FACEP   Suzannah Bettes L Macy Lingenfelter, MD 09/15/11 2341 

## 2011-09-15 NOTE — ED Notes (Signed)
+   urine Patient treated with Cipro-sensitive to same-chart appended per protocol MD. 

## 2012-02-09 ENCOUNTER — Encounter (HOSPITAL_COMMUNITY): Payer: Self-pay | Admitting: *Deleted

## 2012-02-09 ENCOUNTER — Emergency Department (HOSPITAL_COMMUNITY): Payer: Medicaid Other

## 2012-02-09 ENCOUNTER — Emergency Department (HOSPITAL_COMMUNITY)
Admission: EM | Admit: 2012-02-09 | Discharge: 2012-02-09 | Disposition: A | Discharge status: Home / Self Care | Payer: Medicaid Other | Attending: Emergency Medicine | Admitting: Emergency Medicine

## 2012-02-09 DIAGNOSIS — S335XXA Sprain of ligaments of lumbar spine, initial encounter: Secondary | ICD-10-CM | POA: Insufficient documentation

## 2012-02-09 DIAGNOSIS — S161XXA Strain of muscle, fascia and tendon at neck level, initial encounter: Secondary | ICD-10-CM

## 2012-02-09 DIAGNOSIS — F17200 Nicotine dependence, unspecified, uncomplicated: Secondary | ICD-10-CM | POA: Insufficient documentation

## 2012-02-09 DIAGNOSIS — J45909 Unspecified asthma, uncomplicated: Secondary | ICD-10-CM | POA: Insufficient documentation

## 2012-02-09 DIAGNOSIS — Z8659 Personal history of other mental and behavioral disorders: Secondary | ICD-10-CM | POA: Insufficient documentation

## 2012-02-09 DIAGNOSIS — S139XXA Sprain of joints and ligaments of unspecified parts of neck, initial encounter: Secondary | ICD-10-CM | POA: Insufficient documentation

## 2012-02-09 DIAGNOSIS — Z79899 Other long term (current) drug therapy: Secondary | ICD-10-CM | POA: Insufficient documentation

## 2012-02-09 DIAGNOSIS — W19XXXA Unspecified fall, initial encounter: Secondary | ICD-10-CM

## 2012-02-09 DIAGNOSIS — W1789XA Other fall from one level to another, initial encounter: Secondary | ICD-10-CM | POA: Insufficient documentation

## 2012-02-09 DIAGNOSIS — Y929 Unspecified place or not applicable: Secondary | ICD-10-CM | POA: Insufficient documentation

## 2012-02-09 DIAGNOSIS — Y9301 Activity, walking, marching and hiking: Secondary | ICD-10-CM | POA: Insufficient documentation

## 2012-02-09 MED ORDER — NAPROXEN 500 MG PO TABS: 500.0000 mg | ORAL_TABLET | Freq: Two times a day (BID) | ORAL | Status: DC

## 2012-02-09 MED ORDER — HYDROCODONE-ACETAMINOPHEN 5-325 MG PO TABS: ORAL_TABLET | ORAL | Status: DC

## 2012-02-09 MED ORDER — HYDROCODONE-ACETAMINOPHEN 5-325 MG PO TABS
1.0000 | ORAL_TABLET | Freq: Once | ORAL | Status: AC
  Administered 2012-02-09: 1 via ORAL
  Filled 2012-02-09: qty 1

## 2012-02-09 MED ORDER — CYCLOBENZAPRINE HCL 10 MG PO TABS
10.0000 mg | ORAL_TABLET | Freq: Once | ORAL | Status: AC
  Administered 2012-02-09: 10 mg via ORAL
  Filled 2012-02-09: qty 1

## 2012-02-09 MED ORDER — CYCLOBENZAPRINE HCL 10 MG PO TABS: 10.0000 mg | ORAL_TABLET | Freq: Three times a day (TID) | ORAL | Status: DC | PRN

## 2012-02-09 NOTE — ED Notes (Signed)
C/o all over back and neck pain after falling last week; states she picked up a box and then fell backward landing on back. States has tried OTC pain relievers but they do not help her pain.

## 2012-02-11 NOTE — ED Provider Notes (Signed)
History     CSN: [id number]  Arrival date & time 02/09/12  1825   First MD Initiated Contact with Patient 02/09/12 2106      Chief Complaint  Patient presents with  . Fall  . Back Pain  . Neck Pain    (Consider location/radiation/quality/duration/timing/severity/associated sxs/prior treatment) Patient is a 26 y.o. female presenting with fall, back pain, and neck pain. The history is provided by the patient.  Fall The accident occurred more than 1 week ago. The fall occurred while walking. She fell from a height of 3 to 5 ft. She landed on a hard floor. There was no blood loss. Point of impact: low back/buttocks. Pain location: neck and low back. The pain is moderate. She was ambulatory at the scene. There was no entrapment after the fall. There was no drug use involved in the accident. There was no alcohol use involved in the accident. Pertinent negatives include no visual change, no fever, no numbness, no abdominal pain, no bowel incontinence, no nausea, no vomiting, no hematuria, no headaches, no loss of consciousness and no tingling. The symptoms are aggravated by activity, ambulation and sitting. She has tried acetaminophen for the symptoms. The treatment provided no relief.  Back Pain  This is a new problem. The current episode started more than 1 week ago. The problem occurs constantly. The problem has not changed since onset.The pain is associated with falling. The pain is present in the lumbar spine. The quality of the pain is described as aching. The pain does not radiate. The pain is mild. The symptoms are aggravated by bending, twisting and certain positions. Pertinent negatives include no fever, no numbness, no headaches, no abdominal pain, no abdominal swelling, no bowel incontinence, no perianal numbness, no bladder incontinence, no dysuria, no pelvic pain, no leg pain, no paresthesias, no paresis, no tingling and no weakness. She has tried analgesics for the symptoms. The  treatment provided no relief.  Neck Pain  This is a new problem. The current episode started more than 1 week ago. The problem occurs constantly. The problem has not changed since onset.The pain is associated with a fall and a recent injury. There has been no fever. The pain is present in the generalized neck. The quality of the pain is described as aching. The pain does not radiate. The pain is mild. The symptoms are aggravated by bending, position and twisting. Pertinent negatives include no visual change, no numbness, no headaches, no bowel incontinence, no bladder incontinence, no leg pain, no paresis, no tingling and no weakness. She has tried analgesics for the symptoms. The treatment provided no relief.    Past Medical History  Diagnosis Date  . Asthma   . Bronchitis   . Seasonal allergies   . Depression   . Anxiety   . Bipolar 1 disorder     Past Surgical History  Procedure Date  . Dilation and curettage of uterus     Family History  Problem Relation Age of Onset  . Hypertension Mother   . Hypertension Father   . Anesthesia problems Neg Hx     History  Substance Use Topics  . Smoking status: Current Every Day Smoker -- 0.2 packs/day for 11 years    Types: Cigarettes    Last Attempt to Quit: 12/01/2010  . Smokeless tobacco: Never Used  . Alcohol Use: No    OB History    Grav Para Term Preterm Abortions TAB SAB Ect Mult Living   4 3 1  2 1  1   3       Review of Systems  Constitutional: Negative for fever.  HENT: Positive for neck pain. Negative for facial swelling and neck stiffness.   Respiratory: Negative for shortness of breath.   Gastrointestinal: Negative for nausea, vomiting, abdominal pain, constipation and bowel incontinence.  Genitourinary: Negative for bladder incontinence, dysuria, hematuria, flank pain, decreased urine volume, difficulty urinating and pelvic pain.       No perineal numbness or incontinence of urine or feces  Musculoskeletal: Positive  for back pain. Negative for joint swelling.  Skin: Negative for rash.  Neurological: Negative for dizziness, tingling, loss of consciousness, syncope, weakness, numbness, headaches and paresthesias.  All other systems reviewed and are negative.    Allergies  Amoxicillin; Other; and Penicillins  Home Medications   Current Outpatient Rx  Name  Route  Sig  Dispense  Refill  . ACETAMINOPHEN 500 MG PO TABS   Oral   Take 1,000 mg by mouth every 6 (six) hours as needed. Patient used this medication for a headache and/or back pain         . ALBUTEROL SULFATE HFA 108 (90 BASE) MCG/ACT IN AERS   Inhalation   Inhale 2 puffs into the lungs every 6 (six) hours as needed. For shortness of breath         . CO Q 10 PO   Oral   Take 1 capsule by mouth daily.         . IBUPROFEN 200 MG PO TABS   Oral   Take 200 mg by mouth every 6 (six) hours as needed. For pain         . MULTI-VITAMIN/MINERALS PO TABS   Oral   Take 1 tablet by mouth daily.         Marland Kitchen VITAMIN E 100 UNITS PO CAPS   Oral   Take 100 Units by mouth daily.         . CYCLOBENZAPRINE HCL 10 MG PO TABS   Oral   Take 1 tablet (10 mg total) by mouth 3 (three) times daily as needed for muscle spasms.   21 tablet   0   . HYDROCODONE-ACETAMINOPHEN 5-325 MG PO TABS      Take one-two tabs po q 4-6 hrs prn pain   15 tablet   0   . NAPROXEN 500 MG PO TABS   Oral   Take 1 tablet (500 mg total) by mouth 2 (two) times daily with a meal.   20 tablet   0     BP 125/69  Pulse 71  Temp 99 F (37.2 C) (Oral)  Resp 20  Ht 5\' 3"  (1.6 m)  Wt 117 lb (53.071 kg)  BMI 20.73 kg/m2  SpO2 99%  LMP 02/09/2012  Physical Exam  Nursing note and vitals reviewed. Constitutional: She is oriented to person, place, and time. She appears well-developed and well-nourished. No distress.  HENT:  Head: Normocephalic and atraumatic.  Mouth/Throat: Oropharynx is clear and moist.  Neck: Normal range of motion and phonation normal.  Neck supple. Muscular tenderness present. No spinous process tenderness present. No rigidity. No edema and normal range of motion present.  Cardiovascular: Normal rate, regular rhythm and intact distal pulses.   No murmur heard. Pulmonary/Chest: Effort normal and breath sounds normal.  Musculoskeletal: She exhibits tenderness. She exhibits no edema.       Cervical back: She exhibits tenderness and pain. She exhibits normal range of motion, no bony  tenderness, no swelling, no edema, no deformity, no laceration, no spasm and normal pulse.       Lumbar back: She exhibits tenderness and pain. She exhibits normal range of motion, no swelling, no deformity, no laceration and normal pulse.       Back:       ttp of the cervical and lumbar paraspinal muscles.  DP pulses brisk, sensation intact.  Grip strength is strong and equal bilaterally, radial pulses brisk, CR< 2 sec  Neurological: She is alert and oriented to person, place, and time. No cranial nerve deficit or sensory deficit. She exhibits normal muscle tone. Coordination and gait normal.  Reflex Scores:      Patellar reflexes are 2+ on the right side and 2+ on the left side.      Achilles reflexes are 2+ on the right side and 2+ on the left side. Skin: Skin is warm and dry.    ED Course  Procedures (including critical care time)  Labs Reviewed - No data to display Dg Cervical Spine Complete  02/09/2012  *RADIOLOGY REPORT*  Clinical Data: Fall.  Neck pain.  CERVICAL SPINE - COMPLETE 4+ VIEW  Comparison: MRI 05/19/2007.  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  There is no cervical spine fracture. Cervicothoracic junction appears within normal limits.  The prevertebral soft tissues appear normal.  Odontoid is intact.  IMPRESSION: No acute osseous abnormality.  Congenital fusion anomaly of C2-C3.   Original Report Authenticated By: Andreas Newport, M.D.    Dg Lumbar Spine Complete  02/09/2012   *RADIOLOGY REPORT*  Clinical Data: Fall.  Back pain.  LUMBAR SPINE - COMPLETE 4+ VIEW  Comparison: None.  Findings: Five lumbar type vertebral bodies.  Mild dextroconvex curvature may be positional or secondary to spasm.  Vertebral body height is preserved.  Intervertebral disc spaces appear normal. Lumbosacral junction appears normal.  IMPRESSION: No acute osseous abnormality.   Original Report Authenticated By: Andreas Newport, M.D.      1. Lumbar back sprain   2. Cervical strain   3. Fall       MDM    Patient has ttp of the lumbar and cervical paraspinal muscles.  No focal neuro deficits on exam.  Ambulates with a steady gait.   Moves LE's and UE's w/o difficulty  Patient agrees to f/u with PMD. Doubt emergent neurological process  Prescribed: Flexeril norco #15 naprosyn    Seymone Forlenza L. [place], [place] 02/11/12 1906

## 2012-02-12 NOTE — ED Provider Notes (Signed)
Medical screening examination/treatment/procedure(s) were performed by non-physician practitioner and as supervising physician I was immediately available for consultation/collaboration.   Glynn Octave, MD 02/12/12 [phone]

## 2012-03-16 ENCOUNTER — Telehealth: Payer: Self-pay | Admitting: Orthopedic Surgery

## 2012-03-16 NOTE — Telephone Encounter (Signed)
Patient called back in response to inquiry 02/25/12 following Emergency Department visit at [facility] 02/09/12 for problem back pain; had Xrays there.  We had tried calling back to patient, per Aurea Graff and per Steward Drone - see scanned note - 03/04/12 and 03/08/12; ph# not allowing to leave message.  Per Dr. Mort Sawyers review of Xrays, recommends to see primary care first, then if not better, to schedule here for appointment.  Patient confirmed that her current insurance is [employer] with [facility] as her primary care provider.  She will call this office to schedule there first. Ph # [phone].

## 2012-04-04 NOTE — Progress Notes (Signed)
This encounter was created in error - please disregard.

## 2012-05-24 ENCOUNTER — Emergency Department (HOSPITAL_COMMUNITY)
Admission: EM | Admit: 2012-05-24 | Discharge: 2012-05-24 | Disposition: A | Discharge status: Home / Self Care | Payer: Medicaid Other | Attending: Emergency Medicine | Admitting: Emergency Medicine

## 2012-05-24 ENCOUNTER — Encounter (HOSPITAL_COMMUNITY): Payer: Self-pay | Admitting: *Deleted

## 2012-05-24 DIAGNOSIS — R059 Cough, unspecified: Secondary | ICD-10-CM | POA: Insufficient documentation

## 2012-05-24 DIAGNOSIS — J45909 Unspecified asthma, uncomplicated: Secondary | ICD-10-CM | POA: Insufficient documentation

## 2012-05-24 DIAGNOSIS — Z79899 Other long term (current) drug therapy: Secondary | ICD-10-CM | POA: Insufficient documentation

## 2012-05-24 DIAGNOSIS — Z8659 Personal history of other mental and behavioral disorders: Secondary | ICD-10-CM | POA: Insufficient documentation

## 2012-05-24 DIAGNOSIS — J069 Acute upper respiratory infection, unspecified: Secondary | ICD-10-CM

## 2012-05-24 DIAGNOSIS — R05 Cough: Secondary | ICD-10-CM | POA: Insufficient documentation

## 2012-05-24 DIAGNOSIS — J3489 Other specified disorders of nose and nasal sinuses: Secondary | ICD-10-CM | POA: Insufficient documentation

## 2012-05-24 DIAGNOSIS — R509 Fever, unspecified: Secondary | ICD-10-CM | POA: Insufficient documentation

## 2012-05-24 DIAGNOSIS — F17200 Nicotine dependence, unspecified, uncomplicated: Secondary | ICD-10-CM | POA: Insufficient documentation

## 2012-05-24 MED ORDER — IBUPROFEN 800 MG PO TABS
800.0000 mg | ORAL_TABLET | Freq: Once | ORAL | Status: AC
  Administered 2012-05-24: 800 mg via ORAL
  Filled 2012-05-24: qty 1

## 2012-05-24 MED ORDER — BENZONATATE 100 MG PO CAPS: 200.0000 mg | ORAL_CAPSULE | Freq: Three times a day (TID) | ORAL | Status: DC | PRN

## 2012-05-24 MED ORDER — IBUPROFEN 800 MG PO TABS: 800.0000 mg | ORAL_TABLET | Freq: Three times a day (TID) | ORAL | Status: DC | PRN

## 2012-05-24 NOTE — ED Notes (Signed)
Sore throat, cough NP,

## 2012-05-24 NOTE — ED Notes (Signed)
nad noted prior to dc. Dc instructions reviewed and explained. 2 scripts given to pt.

## 2012-05-24 NOTE — ED Provider Notes (Signed)
History     CSN: [id number]  Arrival date & time 05/24/12  1713   First MD Initiated Contact with Patient 05/24/12 1741      Chief Complaint  Patient presents with  . Sore Throat    (Consider location/radiation/quality/duration/timing/severity/associated sxs/prior treatment) HPI Comments: Norma Kim is a 27 y.o. Female presenting with a a 1day history of uri type symptoms which includes nasal congestion with clear rhinorrhea, sore throat,and nonproductive cough.  Symptoms due to not include shortness of breath, chest pain,  Nausea, vomiting or diarrhea.  The patient has taken sudafed and robitussin prior to arrival with no significant improvement in symptoms.      The history is provided by the patient.    Past Medical History  Diagnosis Date  . Asthma   . Bronchitis   . Seasonal allergies   . Depression   . Anxiety   . Bipolar 1 disorder     Past Surgical History  Procedure Laterality Date  . Dilation and curettage of uterus      Family History  Problem Relation Age of Onset  . Hypertension Mother   . Hypertension Father   . Anesthesia problems Neg Hx     History  Substance Use Topics  . Smoking status: Current Every Day Smoker -- 0.25 packs/day for 11 years    Types: Cigarettes    Last Attempt to Quit: 12/01/2010  . Smokeless tobacco: Never Used  . Alcohol Use: No    OB History   Grav Para Term Preterm Abortions TAB SAB Ect Mult Living   4 3 1 2 1  1   3       Review of Systems  Constitutional: Positive for fever.  HENT: Positive for congestion, sore throat and rhinorrhea. Negative for facial swelling and neck pain.   Eyes: Negative.   Respiratory: Positive for cough. Negative for chest tightness and shortness of breath.   Cardiovascular: Negative for chest pain.  Gastrointestinal: Negative for nausea and abdominal pain.  Genitourinary: Negative.   Musculoskeletal: Negative for joint swelling and arthralgias.  Skin: Negative.  Negative for  rash and wound.  Neurological: Negative for dizziness, weakness, light-headedness, numbness and headaches.  Psychiatric/Behavioral: Negative.     Allergies  Amoxicillin; Other; and Penicillins  Home Medications   Current Outpatient Rx  Name  Route  Sig  Dispense  Refill  . albuterol (PROVENTIL HFA;VENTOLIN HFA) 108 (90 BASE) MCG/ACT inhaler   Inhalation   Inhale 2 puffs into the lungs every 6 (six) hours as needed. For shortness of breath         . Coenzyme Q10 (CO Q 10 PO)   Oral   Take 1 capsule by mouth daily.         Marland Kitchen ibuprofen (ADVIL) 200 MG tablet   Oral   Take 200 mg by mouth every 6 (six) hours as needed. For pain         . levonorgestrel-ethinyl estradiol (SRONYX) 0.1-20 MG-MCG tablet   Oral   Take 1 tablet by mouth daily.         . Multiple Vitamins-Minerals (MULTIVITAMIN WITH MINERALS) tablet   Oral   Take 1 tablet by mouth daily.         . vitamin E 100 UNIT capsule   Oral   Take 100 Units by mouth daily.         Marland Kitchen ibuprofen (ADVIL,MOTRIN) 800 MG tablet   Oral   Take 1 tablet (800 mg total) by mouth  every 8 (eight) hours as needed for pain.   21 tablet   0     Pulse 79  Temp(Src) 98.3 F (36.8 C) (Oral)  Resp 20  Ht 5' 3.5" (1.613 m)  Wt 119 lb (53.978 kg)  BMI 20.75 kg/m2  SpO2 100%  LMP 04/30/2012  Breastfeeding? No  Physical Exam  Constitutional: She is oriented to person, place, and time. She appears well-developed and well-nourished.  HENT:  Head: Normocephalic and atraumatic.  Right Ear: Tympanic membrane and ear canal normal.  Left Ear: Tympanic membrane and ear canal normal.  Nose: Mucosal edema and rhinorrhea present.  Mouth/Throat: Uvula is midline and mucous membranes are normal. Posterior oropharyngeal erythema present. No oropharyngeal exudate, posterior oropharyngeal edema or tonsillar abscesses.  Eyes: Conjunctivae are normal.  Cardiovascular: Normal rate and normal heart sounds.   Pulmonary/Chest: Effort normal.  No respiratory distress. She has no wheezes. She has no rales.  Abdominal: Soft. There is no tenderness.  Musculoskeletal: Normal range of motion.  Neurological: She is alert and oriented to person, place, and time.  Skin: Skin is warm and dry. No rash noted.  Psychiatric: She has a normal mood and affect.    ED Course  Procedures (including critical care time)  Labs Reviewed  RAPID STREP SCREEN   No results found.   1. URI, acute       MDM  Patients labs and/or radiological studies were viewed and considered during the medical decision making and disposition process.  Pt prescribed ibuprofen 800 mg ,  Tessalon for cough.  Encouraged rest,  Increased fluids.  The patient appears reasonably screened and/or stabilized for discharge and I doubt any other medical condition or other [facility] requiring further screening, evaluation, or treatment in the ED at this time prior to discharge.         Burgess Amor, PA-C 05/24/12 1824

## 2012-05-24 NOTE — ED Provider Notes (Signed)
Medical screening examination/treatment/procedure(s) were performed by non-physician practitioner and as supervising physician I was immediately available for consultation/collaboration. Devoria Albe, MD, FACEP   Ward Givens, MD 05/24/12 [phone]

## 2012-10-29 ENCOUNTER — Emergency Department (HOSPITAL_COMMUNITY): Payer: Medicaid Other

## 2012-10-29 ENCOUNTER — Emergency Department (HOSPITAL_COMMUNITY)
Admission: EM | Admit: 2012-10-29 | Discharge: 2012-10-29 | Disposition: A | Discharge status: Home / Self Care | Payer: Medicaid Other | Attending: Emergency Medicine | Admitting: Emergency Medicine

## 2012-10-29 ENCOUNTER — Encounter (HOSPITAL_COMMUNITY): Payer: Self-pay | Admitting: Emergency Medicine

## 2012-10-29 DIAGNOSIS — J45909 Unspecified asthma, uncomplicated: Secondary | ICD-10-CM | POA: Insufficient documentation

## 2012-10-29 DIAGNOSIS — Y939 Activity, unspecified: Secondary | ICD-10-CM | POA: Insufficient documentation

## 2012-10-29 DIAGNOSIS — F17200 Nicotine dependence, unspecified, uncomplicated: Secondary | ICD-10-CM | POA: Insufficient documentation

## 2012-10-29 DIAGNOSIS — R296 Repeated falls: Secondary | ICD-10-CM | POA: Insufficient documentation

## 2012-10-29 DIAGNOSIS — F411 Generalized anxiety disorder: Secondary | ICD-10-CM | POA: Insufficient documentation

## 2012-10-29 DIAGNOSIS — F319 Bipolar disorder, unspecified: Secondary | ICD-10-CM | POA: Insufficient documentation

## 2012-10-29 DIAGNOSIS — Z79899 Other long term (current) drug therapy: Secondary | ICD-10-CM | POA: Insufficient documentation

## 2012-10-29 DIAGNOSIS — S93401A Sprain of unspecified ligament of right ankle, initial encounter: Secondary | ICD-10-CM

## 2012-10-29 DIAGNOSIS — J309 Allergic rhinitis, unspecified: Secondary | ICD-10-CM | POA: Insufficient documentation

## 2012-10-29 DIAGNOSIS — S93409A Sprain of unspecified ligament of unspecified ankle, initial encounter: Secondary | ICD-10-CM | POA: Insufficient documentation

## 2012-10-29 DIAGNOSIS — Z880 Allergy status to penicillin: Secondary | ICD-10-CM | POA: Insufficient documentation

## 2012-10-29 DIAGNOSIS — Y929 Unspecified place or not applicable: Secondary | ICD-10-CM | POA: Insufficient documentation

## 2012-10-29 MED ORDER — IBUPROFEN 800 MG PO TABS: 800.0000 mg | ORAL_TABLET | Freq: Three times a day (TID) | ORAL | Status: DC

## 2012-10-29 MED ORDER — IBUPROFEN 800 MG PO TABS
800.0000 mg | ORAL_TABLET | Freq: Once | ORAL | Status: AC
  Administered 2012-10-29: 800 mg via ORAL
  Filled 2012-10-29: qty 1

## 2012-10-29 NOTE — ED Notes (Signed)
Pt fell landing on her R lat ankle.

## 2012-11-02 NOTE — ED Provider Notes (Signed)
CSN: [id number]     Arrival date & time 10/29/12  1355 History   First MD Initiated Contact with Patient 10/29/12 1418     Chief Complaint  Patient presents with  . Ankle Pain   (Consider location/radiation/quality/duration/timing/severity/associated sxs/prior Treatment) Patient is a 27 y.o. female presenting with ankle pain. The history is provided by the patient.  Ankle Pain Location:  Ankle Time since incident:  1 day Injury: yes   Mechanism of injury: fall   Mechanism of injury comment:  Twisting injury Fall:    Fall occurred:  Standing   Impact surface:  Designer, fashion/clothing of impact:  Feet   Entrapped after fall: no   Ankle location:  R ankle Pain details:    Quality:  Aching   Radiates to:  Does not radiate   Severity:  Moderate   Onset quality:  Sudden   Timing:  Constant Chronicity:  New Dislocation: no   Foreign body present:  No foreign bodies Prior injury to area:  No Relieved by:  Nothing Worsened by:  Nothing tried Ineffective treatments:  None tried Associated symptoms: swelling   Associated symptoms: no back pain, no fever, no neck pain, no stiffness and no tingling     Past Medical History  Diagnosis Date  . Asthma   . Bronchitis   . Seasonal allergies   . Depression   . Anxiety   . Bipolar 1 disorder    Past Surgical History  Procedure Laterality Date  . Dilation and curettage of uterus     Family History  Problem Relation Age of Onset  . Hypertension Mother   . Hypertension Father   . Anesthesia problems Neg Hx    History  Substance Use Topics  . Smoking status: Current Every Day Smoker -- 0.25 packs/day for 11 years    Types: Cigarettes    Last Attempt to Quit: 12/01/2010  . Smokeless tobacco: Never Used  . Alcohol Use: No   OB History   Grav Para Term Preterm Abortions TAB SAB Ect Mult Living   4 3 1 2 1  1   3      Review of Systems  Constitutional: Negative for fever and chills.  HENT: Negative for neck pain.   Genitourinary:  Negative for dysuria and difficulty urinating.  Musculoskeletal: Positive for joint swelling and arthralgias. Negative for back pain and stiffness.  Skin: Negative for color change and wound.  All other systems reviewed and are negative.    Allergies  Amoxicillin; Other; and Penicillins  Home Medications   Current Outpatient Rx  Name  Route  Sig  Dispense  Refill  . Coenzyme Q10 (CO Q 10 PO)   Oral   Take 1 capsule by mouth daily.         Marland Kitchen ibuprofen (ADVIL) 200 MG tablet   Oral   Take 200 mg by mouth every 6 (six) hours as needed. For pain         . Multiple Vitamins-Minerals (MULTIVITAMIN WITH MINERALS) tablet   Oral   Take 1 tablet by mouth daily.         Marland Kitchen albuterol (PROVENTIL HFA;VENTOLIN HFA) 108 (90 BASE) MCG/ACT inhaler   Inhalation   Inhale 2 puffs into the lungs every 6 (six) hours as needed. For shortness of breath         . benzonatate (TESSALON) 100 MG capsule   Oral   Take 2 capsules (200 mg total) by mouth 3 (three) times daily as  needed for cough.   21 capsule   0   . ibuprofen (ADVIL,MOTRIN) 800 MG tablet   Oral   Take 1 tablet (800 mg total) by mouth every 8 (eight) hours as needed for pain.   21 tablet   0   . ibuprofen (ADVIL,MOTRIN) 800 MG tablet   Oral   Take 1 tablet (800 mg total) by mouth 3 (three) times daily.   21 tablet   0   . levonorgestrel-ethinyl estradiol (SRONYX) 0.1-20 MG-MCG tablet   Oral   Take 1 tablet by mouth daily.         . vitamin E 100 UNIT capsule   Oral   Take 100 Units by mouth daily.          BP 103/69  Pulse 89  Temp(Src) 98.6 F (37 C) (Oral)  Resp 16  Ht 5\' 3"  (1.6 m)  Wt 118 lb (53.524 kg)  BMI 20.91 kg/m2  SpO2 98%  LMP 10/29/2012 Physical Exam  Nursing note and vitals reviewed. Constitutional: She is oriented to person, place, and time. She appears well-developed and well-nourished. No distress.  HENT:  Head: Normocephalic and atraumatic.  Cardiovascular: Normal rate, regular  rhythm, normal heart sounds and intact distal pulses.   No murmur heard. Pulmonary/Chest: Effort normal and breath sounds normal.  Musculoskeletal: She exhibits edema and tenderness.  Right lateral ankle is ttp, mild STS is present.  ROM is preserved.  DP pulse is brisk,distal sensation intact.  No erythema, abrasion, bruising or bony deformity.  No proximal tenderness.  Neurological: She is alert and oriented to person, place, and time. She exhibits normal muscle tone. Coordination normal.  Skin: Skin is warm and dry.    ED Course  Procedures (including critical care time) Labs Review Labs Reviewed - No data to display Imaging Review Dg Ankle Complete Right  10/29/2012   *RADIOLOGY REPORT*  Clinical Data: Right ankle pain and swelling.  Inversion injury.  RIGHT ANKLE - COMPLETE 3+ VIEW  Comparison: None.  Findings: The ankle mortise is congruent.  The dome is intact. Ankle alignment is anatomic.  There is no fracture.  IMPRESSION: Negative.   Original Report Authenticated By: Andreas Newport, M.D.    MDM   1. Sprain of ankle, right, initial encounter     ASO applied, pain improved, remains NV intact.  X-ray findings discussed and Pt agrees to RICE therapy and close ortho f/u if the pain is not improving.     Carmichael Burdette L. Trisha Mangle, PA-C 11/02/12 1236

## 2012-11-02 NOTE — ED Provider Notes (Signed)
Medical screening examination/treatment/procedure(s) were performed by non-physician practitioner and as supervising physician I was immediately available for consultation/collaboration.   Benny Lennert, MD 11/02/12 [phone]

## 2012-11-09 ENCOUNTER — Encounter: Payer: Self-pay | Admitting: Adult Health

## 2012-11-09 ENCOUNTER — Ambulatory Visit (INDEPENDENT_AMBULATORY_CARE_PROVIDER_SITE_OTHER): Payer: Medicaid Other | Admitting: Adult Health

## 2012-11-09 VITALS — BP 120/80 | Ht 63.5 in | Wt 119.0 lb

## 2012-11-09 DIAGNOSIS — Z3202 Encounter for pregnancy test, result negative: Secondary | ICD-10-CM

## 2012-11-09 NOTE — Addendum Note (Signed)
Addended by: Criss Alvine on: 11/09/2012 10:47 AM   Modules accepted: Orders

## 2012-11-10 ENCOUNTER — Telehealth: Payer: Self-pay | Admitting: Obstetrics & Gynecology

## 2012-11-10 LAB — HCG, QUANTITATIVE, PREGNANCY: hCG, Beta Chain, Quant, S: <2 m[IU]/mL

## 2012-11-10 NOTE — Telephone Encounter (Signed)
Pt aware of results 

## 2012-12-11 ENCOUNTER — Encounter (HOSPITAL_COMMUNITY): Payer: Self-pay | Admitting: Emergency Medicine

## 2012-12-11 ENCOUNTER — Emergency Department (HOSPITAL_COMMUNITY)
Admission: EM | Admit: 2012-12-11 | Discharge: 2012-12-11 | Disposition: A | Discharge status: Home / Self Care | Payer: Medicaid Other | Attending: Emergency Medicine | Admitting: Emergency Medicine

## 2012-12-11 DIAGNOSIS — R112 Nausea with vomiting, unspecified: Secondary | ICD-10-CM | POA: Insufficient documentation

## 2012-12-11 DIAGNOSIS — Z8659 Personal history of other mental and behavioral disorders: Secondary | ICD-10-CM | POA: Insufficient documentation

## 2012-12-11 DIAGNOSIS — Z79899 Other long term (current) drug therapy: Secondary | ICD-10-CM | POA: Insufficient documentation

## 2012-12-11 DIAGNOSIS — Z3202 Encounter for pregnancy test, result negative: Secondary | ICD-10-CM | POA: Insufficient documentation

## 2012-12-11 DIAGNOSIS — Z880 Allergy status to penicillin: Secondary | ICD-10-CM | POA: Insufficient documentation

## 2012-12-11 DIAGNOSIS — R110 Nausea: Secondary | ICD-10-CM

## 2012-12-11 DIAGNOSIS — J45909 Unspecified asthma, uncomplicated: Secondary | ICD-10-CM | POA: Insufficient documentation

## 2012-12-11 DIAGNOSIS — F17200 Nicotine dependence, unspecified, uncomplicated: Secondary | ICD-10-CM | POA: Insufficient documentation

## 2012-12-11 LAB — POCT PREGNANCY, URINE: Preg Test, Ur: NEGATIVE

## 2012-12-11 NOTE — ED Notes (Signed)
Pt states all she wants is a pregnancy test. States took one today and was positive. nad

## 2012-12-11 NOTE — ED Notes (Addendum)
Initial assessment by EDP Pauline Aus,  PA

## 2012-12-11 NOTE — ED Provider Notes (Signed)
Medical screening examination/treatment/procedure(s) were performed by non-physician practitioner and as supervising physician I was immediately available for consultation/collaboration.    Vida Roller, MD 12/11/12 2241

## 2012-12-11 NOTE — ED Provider Notes (Signed)
CSN: [id number]     Arrival date & time 12/11/12  1721 History   First MD Initiated Contact with Patient 12/11/12 1736    This chart was scribed for Toshiye Kever Rowe Robert, a non-physician practitioner working with Vida Roller, MD by Lewanda Rife, ED Scribe. This patient was seen in room APFT24/APFT24 and the patient's care was started at 5:42 PM     Chief Complaint  Patient presents with  . Emesis   (Consider location/radiation/quality/duration/timing/severity/associated sxs/prior Treatment) The history is provided by the patient. No language interpreter was used.   HPI Comments: Norma Kim is a 27 y.o. female who presents to the Emergency Department complaining of positive pregnancy test onset today. Reports she wants to get another pregnancy test and does not want to be evaluated for other issues. Reports associated intermittent emesis (2 weeks), and weight gain. Reports she is tolerating fluids. Denies associated fever, abdominal pain, dysuria, and diarrhea. Reports taking a new birth control for last month. Reports LMP over 1 month ago. Reports PMHx of irregular cycles. Denies associated hx of eclampsia, pre-eclampsia, or tubal pregnancies. Reports she has an appointment with the health department OB-Gyn tomorrow.   G:0 P:3 A:1       Past Medical History  Diagnosis Date  . Asthma   . Bronchitis   . Seasonal allergies   . Depression   . Anxiety   . Bipolar 1 disorder    Past Surgical History  Procedure Laterality Date  . Dilation and curettage of uterus     Family History  Problem Relation Age of Onset  . Hypertension Mother   . Hypertension Father   . Anesthesia problems Neg Hx    History  Substance Use Topics  . Smoking status: Current Every Day Smoker -- 0.25 packs/day for 11 years    Types: Cigarettes    Last Attempt to Quit: 12/01/2010  . Smokeless tobacco: Never Used  . Alcohol Use: No   OB History   Grav Para Term Preterm Abortions TAB SAB Ect Mult  Living   4 3 1 2 1  1   3      Review of Systems  Constitutional: Negative for fever, activity change and appetite change.  Gastrointestinal: Positive for nausea and vomiting. Negative for abdominal pain and diarrhea.  Genitourinary: Negative for dysuria, flank pain, vaginal discharge, difficulty urinating, vaginal pain and pelvic pain.  Musculoskeletal: Negative for back pain.  All other systems reviewed and are negative.    Allergies  Amoxicillin; Other; and Penicillins  Home Medications   Current Outpatient Rx  Name  Route  Sig  Dispense  Refill  . albuterol (PROVENTIL HFA;VENTOLIN HFA) 108 (90 BASE) MCG/ACT inhaler   Inhalation   Inhale 2 puffs into the lungs every 6 (six) hours as needed. For shortness of breath         . Coenzyme Q10 (CO Q 10 PO)   Oral   Take 1 capsule by mouth daily.         Marland Kitchen ibuprofen (ADVIL,MOTRIN) 800 MG tablet   Oral   Take 1 tablet (800 mg total) by mouth every 8 (eight) hours as needed for pain.   21 tablet   0   . Multiple Vitamins-Minerals (MULTIVITAMIN WITH MINERALS) tablet   Oral   Take 1 tablet by mouth daily.         . vitamin E 100 UNIT capsule   Oral   Take 100 Units by mouth daily.  BP 112/79  Pulse 124  Temp(Src) 98.3 F (36.8 C) (Oral)  Resp 20  Ht 5' 3.5" (1.613 m)  Wt 119 lb (53.978 kg)  BMI 20.75 kg/m2  SpO2 97%  LMP 11/20/2012 Physical Exam  Nursing note and vitals reviewed. Constitutional: She is oriented to person, place, and time. She appears well-developed and well-nourished. No distress.  HENT:  Head: Normocephalic and atraumatic.  Mouth/Throat: Oropharynx is clear and moist.  Eyes: EOM are normal.  Neck: Normal range of motion. Neck supple. No tracheal deviation present.  Cardiovascular: Normal rate, regular rhythm and normal heart sounds.   No murmur heard. Pulmonary/Chest: Effort normal and breath sounds normal. No respiratory distress. She has no wheezes. She has no rales. She  exhibits no tenderness.  Abdominal: Soft. Normal appearance. She exhibits no distension and no mass. There is no hepatosplenomegaly. There is no tenderness. There is no rebound and no guarding.  Musculoskeletal: Normal range of motion. She exhibits no tenderness.  Lymphadenopathy:    She has no cervical adenopathy.  Neurological: She is alert and oriented to person, place, and time. She exhibits normal muscle tone. Coordination normal.  Skin: Skin is warm and dry.  Psychiatric: She has a normal mood and affect. Her behavior is normal.    ED Course  Procedures (including critical care time)   Initial diagnostic testing ordered.    Labs Review Results for orders placed during the hospital encounter of 12/11/12  POCT PREGNANCY, URINE      Result Value Range   Preg Test, Ur NEGATIVE  NEGATIVE    Imaging Review No results found.    MDM   Patient is ambulatory,  Well appearing.  Comes to ED requesting a pregnancy test only.  Pt is stable for discharge.  She has an appt tomorrow with the health dept.     I personally performed the services described in this documentation, which was scribed in my presence. The recorded information has been reviewed and is accurate.   Nikko Quast L. Trisha Mangle, PA-C 12/11/12 1813

## 2012-12-13 ENCOUNTER — Encounter: Payer: Self-pay | Admitting: Adult Health

## 2012-12-13 ENCOUNTER — Encounter (INDEPENDENT_AMBULATORY_CARE_PROVIDER_SITE_OTHER): Payer: Self-pay

## 2012-12-13 ENCOUNTER — Ambulatory Visit (INDEPENDENT_AMBULATORY_CARE_PROVIDER_SITE_OTHER): Payer: Medicaid Other | Admitting: Adult Health

## 2012-12-13 VITALS — BP 120/80 | Ht 63.0 in | Wt 119.0 lb

## 2012-12-13 DIAGNOSIS — Z3200 Encounter for pregnancy test, result unknown: Secondary | ICD-10-CM

## 2012-12-13 DIAGNOSIS — Z3202 Encounter for pregnancy test, result negative: Secondary | ICD-10-CM

## 2012-12-13 LAB — POCT URINE PREGNANCY: Preg Test, Ur: NEGATIVE

## 2012-12-13 NOTE — Progress Notes (Signed)
Patient ID: Norma Kim, female   DOB: Aug 02, 1985, 27 y.o.   MRN: [id number] Pt here today for preg. Test. UPT is negative. Pt states she as had a positive UPT at home. Will draw a QHCG to be sure.

## 2012-12-14 ENCOUNTER — Telehealth: Payer: Self-pay | Admitting: Obstetrics & Gynecology

## 2012-12-14 LAB — HCG, QUANTITATIVE, PREGNANCY: hCG, Beta Chain, Quant, S: <2 m[IU]/mL

## 2012-12-14 NOTE — Telephone Encounter (Signed)
Pt states done several pregnancy test resulting positive, pt had QHCG and pregnancy test here negative. Per Dr. Emelda Fear pt to bring in her own PT and make an appt with provider to discuss. Call transferred to front staff for an appt to be made.

## 2012-12-24 ENCOUNTER — Encounter (HOSPITAL_COMMUNITY): Payer: Self-pay | Admitting: Emergency Medicine

## 2012-12-24 ENCOUNTER — Emergency Department (HOSPITAL_COMMUNITY)
Admission: EM | Admit: 2012-12-24 | Discharge: 2012-12-24 | Disposition: A | Discharge status: Home / Self Care | Payer: Medicaid Other | Attending: Emergency Medicine | Admitting: Emergency Medicine

## 2012-12-24 ENCOUNTER — Emergency Department (HOSPITAL_COMMUNITY): Payer: Medicaid Other

## 2012-12-24 DIAGNOSIS — Z79899 Other long term (current) drug therapy: Secondary | ICD-10-CM | POA: Insufficient documentation

## 2012-12-24 DIAGNOSIS — R1012 Left upper quadrant pain: Secondary | ICD-10-CM | POA: Insufficient documentation

## 2012-12-24 DIAGNOSIS — Z8659 Personal history of other mental and behavioral disorders: Secondary | ICD-10-CM | POA: Insufficient documentation

## 2012-12-24 DIAGNOSIS — Z87442 Personal history of urinary calculi: Secondary | ICD-10-CM | POA: Insufficient documentation

## 2012-12-24 DIAGNOSIS — Z880 Allergy status to penicillin: Secondary | ICD-10-CM | POA: Insufficient documentation

## 2012-12-24 DIAGNOSIS — J45909 Unspecified asthma, uncomplicated: Secondary | ICD-10-CM | POA: Insufficient documentation

## 2012-12-24 DIAGNOSIS — R319 Hematuria, unspecified: Secondary | ICD-10-CM | POA: Insufficient documentation

## 2012-12-24 DIAGNOSIS — Z3202 Encounter for pregnancy test, result negative: Secondary | ICD-10-CM | POA: Insufficient documentation

## 2012-12-24 DIAGNOSIS — R109 Unspecified abdominal pain: Secondary | ICD-10-CM

## 2012-12-24 DIAGNOSIS — R1032 Left lower quadrant pain: Secondary | ICD-10-CM | POA: Insufficient documentation

## 2012-12-24 DIAGNOSIS — F17200 Nicotine dependence, unspecified, uncomplicated: Secondary | ICD-10-CM | POA: Insufficient documentation

## 2012-12-24 LAB — URINALYSIS, ROUTINE W REFLEX MICROSCOPIC
Bilirubin Urine: NEGATIVE
Glucose, UA: NEGATIVE mg/dL
Ketones, ur: NEGATIVE mg/dL
Leukocytes, UA: NEGATIVE
Nitrite: NEGATIVE
Protein, ur: NEGATIVE mg/dL
Specific Gravity, Urine: 1.02 (ref 1.005–1.030)
Urobilinogen, UA: 0.2 mg/dL (ref 0.0–1.0)
pH: 6.5 (ref 5.0–8.0)

## 2012-12-24 LAB — POCT PREGNANCY, URINE: Preg Test, Ur: NEGATIVE

## 2012-12-24 LAB — URINE MICROSCOPIC-ADD ON

## 2012-12-24 MED ORDER — NAPROXEN 250 MG PO TABS
500.0000 mg | ORAL_TABLET | Freq: Once | ORAL | Status: AC
  Administered 2012-12-24: 500 mg via ORAL
  Filled 2012-12-24: qty 2

## 2012-12-24 MED ORDER — NAPROXEN 500 MG PO TABS: 500.0000 mg | ORAL_TABLET | Freq: Two times a day (BID) | ORAL | Status: DC

## 2012-12-24 MED ORDER — HYDROCODONE-ACETAMINOPHEN 5-325 MG PO TABS
1.0000 | ORAL_TABLET | Freq: Once | ORAL | Status: AC
  Administered 2012-12-24: 1 via ORAL
  Filled 2012-12-24: qty 1

## 2012-12-24 NOTE — ED Notes (Signed)
Patient with no complaints at this time. Respirations even and unlabored. Skin warm/dry. Discharge instructions reviewed with patient at this time. Patient given opportunity to voice concerns/ask questions. Patient discharged at this time and left Emergency Department with steady gait.   

## 2012-12-24 NOTE — ED Provider Notes (Signed)
CSN: [id number]     Arrival date & time 12/24/12  1232 History  This chart was scribed for Vida Roller, MD by Bennett Scrape, ED Scribe. This patient was seen in room APA03/APA03 and the patient's care was started at 1:03 PM.   Chief Complaint  Patient presents with  . Abdominal Pain    The history is provided by the patient. No language interpreter was used.    HPI Comments: Norma Kim is a 27 y.o. female with a h/o recent diagnosis of PCOS who presents to the Emergency Department complaining of left-sided abdominal pain that started around 8:30 AM that started while relaxing, watching TV. She describes the pain as a gradual onset sharp and stabbing sensation that she rates a 10 out of 10. She denies any radiation into her groin or back or modifying factors. She denies any prior episodes. She states that she had her last menses in August 2014 and has not had another. She has been seen by [facility] and had a negative blood and urine pregnancy test. She took two at home tests that were positive. She denies being on birth control or hormone pills stating that she uses condoms.She denies any prior abdominal surgeries or h/o liver or pancreas diagnoses. She has a h/o kidney stones but states that the pain associated was a 15 out of 10. She denies any urinary symptoms, rashes, leg swelling, changes in bowels, nausea, fevers or chills. She denies any illegal drug use.   Nothing makes the pain better or worse - it is not positional and not associated with nausea - she was able to eat this AM.  Past Medical History  Diagnosis Date  . Asthma   . Bronchitis   . Seasonal allergies   . Depression   . Anxiety   . Bipolar 1 disorder    Past Surgical History  Procedure Laterality Date  . Dilation and curettage of uterus     Family History  Problem Relation Age of Onset  . Hypertension Mother   . Hypertension Father   . Anesthesia problems Neg Hx   . Stroke Other   . Hypertension  Other   . Diabetes Other   . Cancer Other    History  Substance Use Topics  . Smoking status: Current Every Day Smoker -- 0.25 packs/day for 11 years    Types: Cigarettes    Last Attempt to Quit: 12/01/2010  . Smokeless tobacco: Never Used  . Alcohol Use: No   OB History   Grav Para Term Preterm Abortions TAB SAB Ect Mult Living   4 3 1 2 1  1   3      Review of Systems  Constitutional: Negative for fever.  Cardiovascular: Negative for leg swelling.  Gastrointestinal: Positive for abdominal pain. Negative for nausea and vomiting.  Genitourinary: Negative for dysuria and urgency.  Skin: Negative for rash.  All other systems reviewed and are negative.    Allergies  Amoxicillin; Other; and Penicillins  Home Medications   Current Outpatient Rx  Name  Route  Sig  Dispense  Refill  . albuterol (PROVENTIL HFA;VENTOLIN HFA) 108 (90 BASE) MCG/ACT inhaler   Inhalation   Inhale 2 puffs into the lungs every 6 (six) hours as needed. For shortness of breath         . Coenzyme Q10 (CO Q 10 PO)   Oral   Take 1 capsule by mouth daily.         Marland Kitchen  Multiple Vitamins-Minerals (MULTIVITAMIN WITH MINERALS) tablet   Oral   Take 1 tablet by mouth daily.         . naproxen (NAPROSYN) 500 MG tablet   Oral   Take 1 tablet (500 mg total) by mouth 2 (two) times daily with a meal.   30 tablet   0   . vitamin E 100 UNIT capsule   Oral   Take 100 Units by mouth daily.          Triage Vitals: BP 108/68  Pulse 86  Temp(Src) 98.7 F (37.1 C) (Oral)  Resp 20  SpO2 100%  LMP 10/27/2012  Physical Exam  Nursing note and vitals reviewed. Constitutional: She is oriented to person, place, and time. She appears well-developed and well-nourished. No distress.  HENT:  Head: Normocephalic and atraumatic.  Eyes: EOM are normal.  Neck: Neck supple. No tracheal deviation present.  Cardiovascular: Normal rate and regular rhythm.   Pulmonary/Chest: Effort normal and breath sounds normal.  No respiratory distress.  Abdominal: Soft. Bowel sounds are normal. She exhibits no mass. There is tenderness (LUQ and LLQ tenderness). There is no rebound and no guarding.  No McBurney's point tenderness or suprapubic tenderness. No left CVA tenderness  Musculoskeletal: Normal range of motion.  Neurological: She is alert and oriented to person, place, and time.  Skin: Skin is warm and dry. No rash noted.  No rashes on the left abdomen or left flank  Psychiatric: She has a normal mood and affect. Her behavior is normal.    ED Course  Procedures (including critical care time)  Medications  naproxen (NAPROSYN) tablet 500 mg (500 mg Oral Given 12/24/12 1333)  HYDROcodone-acetaminophen (NORCO/VICODIN) 5-325 MG per tablet 1 tablet (1 tablet Oral Given 12/24/12 1333)    DIAGNOSTIC STUDIES: Oxygen Saturation is 100% on room air, normal by my interpretation.    COORDINATION OF CARE: 1:08 PM-Discussed treatment plan which includes pain medications and UA with pt at bedside and pt agreed to plan. She reports that she has a ride home today.   Labs Review Labs Reviewed  URINALYSIS, ROUTINE W REFLEX MICROSCOPIC - Abnormal; Notable for the following:    APPearance HAZY (*)    Hgb urine dipstick LARGE (*)    All other components within normal limits  URINE MICROSCOPIC-ADD ON - Abnormal; Notable for the following:    Squamous Epithelial / LPF FEW (*)    Bacteria, UA FEW (*)    All other components within normal limits  POCT PREGNANCY, URINE   Imaging Review Ct Abdomen Pelvis Wo Contrast  12/24/2012   CLINICAL DATA:  Flank pain and hematuria  EXAM: CT ABDOMEN AND PELVIS WITHOUT CONTRAST  TECHNIQUE: Multidetector CT imaging of the abdomen and pelvis was performed following the standard protocol without oral or intravenous contrast.  COMPARISON:  February 05, 2007  FINDINGS: There is mild subsegmental atelectasis in the anterior left base and posterior right base regions.  The liver is mildly  enlarged measuring 16.7 cm in length. No focal liver lesions are identified on this noncontrast enhanced study. There is no biliary duct dilatation.  Spleen, pancreas, and adrenals appear normal.  There is a 1 x 1 cm cyst in the anterior mid right kidney. There is a 1.6 x 1.5 cm cyst in the posterior lower pole right kidney. There the are several tiny calculi in the right kidney, nonobstructing. There is no hydronephrosis on the right. There is no ureteral calculus or ureterectasis on the right.  On  the left, there are several tiny calculi. There is a 1 x 1 cm cyst in the mid left kidney. There is no appreciable hydronephrosis on the left. There is no appreciable ureteral calculus or ureterectasis on the left.  In the pelvis, there is no mass or fluid collection. Appendix appears normal. There are multiple small phleboliths in the pelvis.  There is no bowel obstruction. No free air or portal venous air.  There is no ascites, adenopathy, or abscess in the abdomen or pelvis.  Aorta is nonaneurysmal. There are no blastic or lytic bone lesions.  IMPRESSION: There are tiny calculi in each kidney. No hydronephrosis. No ureteral calculus appreciated.  The liver is mildly enlarged without focal lesions seen on this noncontrast enhanced study.  No mesenteric inflammation or bowel obstruction. No abscess. Appendix appears normal.   Electronically Signed   By: Bretta Bang M.D.   On: 12/24/2012 13:54    EKG Interpretation   None       MDM   1. Left sided abdominal pain   2. Hematuria    REview of the EMR shows that the pt has had [place] in the past showing ovarian cysts but no other sig pelvic pathology - no KS studies have been performed.  She has hematuria, no sig bacteria, nitrite and LE neg.  She is having active sx, she has no peritoneal signs to suggest a cyst ruptures - she does have some reproducible pain though no guarding - would benefit from further clarification from CT scan - pt requests a CT scan  as well, at this time it is indicated to further evaluate for possible kidney stone or other abdominal pathology causing sx.  CT neg for acute findings - pt informed of results - CT results given to pt - can f/u with PMD  Meds given in ED:  Medications  naproxen (NAPROSYN) tablet 500 mg (500 mg Oral Given 12/24/12 1333)  HYDROcodone-acetaminophen (NORCO/VICODIN) 5-325 MG per tablet 1 tablet (1 tablet Oral Given 12/24/12 1333)    New Prescriptions   NAPROXEN (NAPROSYN) 500 MG TABLET    Take 1 tablet (500 mg total) by mouth 2 (two) times daily with a meal.      I personally performed the services described in this documentation, which was scribed in my presence. The recorded information has been reviewed and is accurate.      Vida Roller, MD 12/24/12 [phone]

## 2012-12-24 NOTE — ED Notes (Addendum)
L lower lateral abdominal pain, feels "like sharp knife jabbing in me".  Began this morning around 0830.  Reports nausea, denies vomiting or diarrhea. Denies dysuria, but possibly passed clot this morning stating "it looked like there were eyes in it".  Last BM was Thursday, states normal to not have BM daily.  Denies injury.  Has taken pregnancy tests x 3 which were positive (from Oct. 12-17). Upon reviewing chart, patient had UPT in this ER on 10/12 and UPT + BHCG by OB on 10/15.  Reports going to OBGYN at [facility] and having negative blood and urine pregnancy tests.

## 2012-12-26 ENCOUNTER — Ambulatory Visit: Payer: Medicaid Other | Admitting: Women's Health

## 2012-12-26 ENCOUNTER — Ambulatory Visit (INDEPENDENT_AMBULATORY_CARE_PROVIDER_SITE_OTHER): Payer: Medicaid Other | Admitting: Women's Health

## 2012-12-26 ENCOUNTER — Encounter: Payer: Self-pay | Admitting: Women's Health

## 2012-12-26 VITALS — BP 102/60 | Ht 63.5 in | Wt 118.0 lb

## 2012-12-26 DIAGNOSIS — R109 Unspecified abdominal pain: Secondary | ICD-10-CM

## 2012-12-26 DIAGNOSIS — N926 Irregular menstruation, unspecified: Secondary | ICD-10-CM

## 2012-12-26 LAB — CBC
HCT: 39.5 % (ref 36.0–46.0)
Hemoglobin: 13.3 g/dL (ref 12.0–15.0)
MCH: 30.6 pg (ref 26.0–34.0)
MCV: 91 fL (ref 78.0–100.0)
Platelets: 270 10*3/uL (ref 150–400)
RBC: 4.34 MIL/uL (ref 3.87–5.11)
WBC: 5.5 10*3/uL (ref 4.0–10.5)

## 2012-12-26 LAB — COMPREHENSIVE METABOLIC PANEL
ALT: <8 U/L (ref 0–35)
CO2: 27 mEq/L (ref 19–32)
Calcium: 9.2 mg/dL (ref 8.4–10.5)
Chloride: 104 mEq/L (ref 96–112)
Creat: 0.61 mg/dL (ref 0.50–1.10)
Glucose, Bld: 90 mg/dL (ref 70–99)

## 2012-12-26 LAB — TSH: TSH: 0.621 u[IU]/mL (ref 0.350–4.500)

## 2012-12-26 NOTE — Patient Instructions (Signed)
Constipation  Drink plenty of fluid, preferably water, throughout the day  Eat foods high in fiber such as fruits, vegetables, and grains  Exercise, such as walking, is a good way to keep your bowels regular  Drink warm fluids, especially warm prune juice, or decaf coffee  Eat a 1/2 cup of real oatmeal (not instant), 1/2 cup applesauce, and 1/2-1 cup warm prune juice every day  If needed, you may take Colace (docusate sodium) stool softener once or twice a day to help keep the stool soft.   If you still are having problems with constipation, you may take Miralax once daily as needed to help keep your bowels regular  Abdominal Pain Abdominal pain can be caused by many things. Your caregiver decides the seriousness of your pain by an examination and possibly blood tests and X-rays. Many cases can be observed and treated at home. Most abdominal pain is not caused by a disease and will probably improve without treatment. However, in many cases, more time must pass before a clear cause of the pain can be found. Before that point, it may not be known if you need more testing, or if hospitalization or surgery is needed. HOME CARE INSTRUCTIONS   Do not take laxatives unless directed by your caregiver.  Take pain medicine only as directed by your caregiver.  Only take over-the-counter or prescription medicines for pain, discomfort, or fever as directed by your caregiver.  Try a clear liquid diet (broth, tea, or water) for as long as directed by your caregiver. Slowly move to a bland diet as tolerated. SEEK IMMEDIATE MEDICAL CARE IF:   The pain does not go away.  You have a fever.  You keep throwing up (vomiting).  The pain is felt only in portions of the abdomen. Pain in the right side could possibly be appendicitis. In an adult, pain in the left lower portion of the abdomen could be colitis or diverticulitis.  You pass bloody or black tarry stools. MAKE SURE YOU:   Understand these  instructions.  Will watch your condition.  Will get help right away if you are not doing well or get worse. Document Released: 11/26/2004 Document Revised: 05/11/2011 Document Reviewed: 10/05/2007 [facility] Patient Information 2014 [place], [place].

## 2012-12-26 NOTE — Progress Notes (Signed)
Patient ID: Norma Kim, female   DOB: 1985-07-11, 27 y.o.   MRN: [id number] Norma Kim is a 27 y.o. [phone] Caucasian female who is here w/ report of LMP 8/28 and is usually regular q month. Also nausea w/ occ vomiting 'some mornings', fatigue, eating more, weight gain: 'I was 118, then went to 119, now I'm 118 again today', intermittent mid abdominal crampy/achy pain that is better when lying and worse w/ sitting. All symptoms x ~1wk.  Denies diarrhea, fever/chills, flank pain, uti s/s, hematuria. Uses condoms for contraception. Last bm Friday and usually goes daily. States she has taken multiple home pregnancy tests that have all been positive. She had a neg urine PT and bHCG here on 10/14. She went to [facility] 10/25 w/ same symptoms, they got an abd/pelvic CT that per EDP was neg for acute findings, and she was rx'd naprosyn.  When I informed pt I would be doing a pelvic exam, she then stated that she began bleeding 2 days ago. I asked her if she thought is was her period, and she said she didn't know.  O: BP 102/60  Ht 5' 3.5" (1.613 m)  Wt 118 lb (53.524 kg)  BMI 20.57 kg/m2  LMP 10/27/2012  Breastfeeding? No       GI: Soft. Bowel sounds are normal. She exhibits no distension and no mass. There is no          tenderness. There is no rebound and no guarding.       Genitourinary:  Vulva is normal without lesions, dark red menstrual blood          Vagina is pink moist without abnormal discharge> large amount of dark red menstrual blood          Cervix normal in appearance, w/o polyps           Uterus is normal size shape and contour          Adnexa is negative with normal sized ovaries    12/24/2012 CT ABDOMEN AND PELVIS WITHOUT CONTRAST TECHNIQUE: Multidetector CT imaging of the abdomen and pelvis was performed following the standard protocol without oral or intravenous contrast. COMPARISON: February 05, 2007 FINDINGS: There is mild subsegmental atelectasis in the anterior left base and  posterior right base regions. The liver is mildly enlarged measuring 16.7 cm in length. No focal liver lesions are identified on this noncontrast enhanced study. There is no biliary duct dilatation. Spleen, pancreas, and adrenals appear normal. There is a 1 x 1 cm cyst in the anterior mid right kidney. There is a 1.6 x 1.5 cm cyst in the posterior lower pole right kidney. There the are several tiny calculi in the right kidney, nonobstructing. There is no hydronephrosis on the right. There is no ureteral calculus or ureterectasis on the right. On the left, there are several tiny calculi. There is a 1 x 1 cm cyst in the mid left kidney. There is no appreciable hydronephrosis on the left. There is no appreciable ureteral calculus or ureterectasis on the left. In the pelvis, there is no mass or fluid collection. Appendix appears normal. There are multiple small phleboliths in the pelvis. There is no bowel obstruction. No free air or portal venous air. There is no ascites, adenopathy, or abscess in the abdomen or pelvis. Aorta is nonaneurysmal. There are no blastic or lytic bone lesions. IMPRESSION: There are tiny calculi in each kidney. No hydronephrosis. No ureteral calculus appreciated. The liver is mildly enlarged without  focal lesions seen on this noncontrast enhanced study. No mesenteric inflammation or bowel obstruction. No abscess. Appendix appears normal. Electronically Signed By: Bretta Bang M.D. On: 12/24/2012 13:54   A:  Skipped menses x 1 month, currently on menses      N/V, abdominal pain, fatigue, constipation      Slight hepatomegaly w/o focal lesions on CT      Small bilateral renal calculi and cysts on CT  P: Discussed w/ JAG      CBC, CMP, TSH today      Info on constipation relief measures given      To f/u 1wk  Cheral Marker, CNM, WHNP-BC 12/26/2012 1:03 PM

## 2012-12-27 LAB — GC/CHLAMYDIA PROBE AMP: CT Probe RNA: NEGATIVE

## 2013-01-02 ENCOUNTER — Ambulatory Visit: Payer: Medicaid Other | Admitting: Women's Health

## 2013-01-19 ENCOUNTER — Ambulatory Visit (INDEPENDENT_AMBULATORY_CARE_PROVIDER_SITE_OTHER): Payer: Medicaid Other | Admitting: Adult Health

## 2013-01-19 ENCOUNTER — Encounter: Payer: Self-pay | Admitting: Obstetrics and Gynecology

## 2013-01-19 ENCOUNTER — Encounter (INDEPENDENT_AMBULATORY_CARE_PROVIDER_SITE_OTHER): Payer: Self-pay

## 2013-01-19 VITALS — BP 110/70 | Ht 63.5 in | Wt 118.6 lb

## 2013-01-19 DIAGNOSIS — A499 Bacterial infection, unspecified: Secondary | ICD-10-CM

## 2013-01-19 DIAGNOSIS — N760 Acute vaginitis: Secondary | ICD-10-CM

## 2013-01-19 DIAGNOSIS — R109 Unspecified abdominal pain: Secondary | ICD-10-CM

## 2013-01-19 DIAGNOSIS — B9689 Other specified bacterial agents as the cause of diseases classified elsewhere: Secondary | ICD-10-CM

## 2013-01-19 DIAGNOSIS — N898 Other specified noninflammatory disorders of vagina: Secondary | ICD-10-CM | POA: Insufficient documentation

## 2013-01-19 HISTORY — DX: Other specified noninflammatory disorders of vagina: N89.8

## 2013-01-19 HISTORY — DX: Unspecified abdominal pain: R10.9

## 2013-01-19 LAB — POCT WET PREP (WET MOUNT)

## 2013-01-19 MED ORDER — METRONIDAZOLE 500 MG PO TABS: 500.0000 mg | ORAL_TABLET | Freq: Two times a day (BID) | ORAL | Status: DC

## 2013-01-19 NOTE — Progress Notes (Signed)
Subjective:     Patient ID: Norma Kim, female   DOB: [birth year], 27 y.o.   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  Review of Systems See HPI Reviewed past medical,surgical, social and family history. Reviewed medications and allergies.     Objective:   Physical Exam BP 110/70  Ht 5' 3.5" (1.613 m)  Wt 118 lb 9.6 oz (53.797 kg)  BMI 20.68 kg/m2  LMP 12/29/2012  Breastfeeding? No Skin warm and dry.Pelvic: external genitalia is normal in appearance, vagina: white discharge with odor, cervix: bulbous with nabothian cyst at 9 0'clock, uterus: normal size, shape and contour, non tender, no masses felt, adnexa: no masses or tenderness noted. Wet prep: + for clue cells and +WBCs.Has increase hair down thighs. On abdominal exam has some tenderness LUQ area, no masses felt.    Assessment:     Left sided abdominal pain, ?bowel Vaginal discharge BV    Plan:     Rx flagyl 500 mg 1 bid x 7 days, no alcohol, review handout    Return in 2 weeks for pelvic US to assess ovaries, discussed possible trial of OCs then   Keep calendar of pain and any events with it Eat bland

## 2013-01-19 NOTE — Patient Instructions (Signed)
Abdominal Pain Abdominal pain can be caused by many things. Your caregiver decides the seriousness of your pain by an examination and possibly blood tests and X-rays. Many cases can be observed and treated at home. Most abdominal pain is not caused by a disease and will probably improve without treatment. However, in many cases, more time must pass before a clear cause of the pain can be found. Before that point, it may not be known if you need more testing, or if hospitalization or surgery is needed. HOME CARE INSTRUCTIONS   Do not take laxatives unless directed by your caregiver.  Take pain medicine only as directed by your caregiver.  Only take over-the-counter or prescription medicines for pain, discomfort, or fever as directed by your caregiver.  Try a clear liquid diet (broth, tea, or water) for as long as directed by your caregiver. Slowly move to a bland diet as tolerated. SEEK IMMEDIATE MEDICAL CARE IF:   The pain does not go away.  You have a fever.  You keep throwing up (vomiting).  The pain is felt only in portions of the abdomen. Pain in the right side could possibly be appendicitis. In an adult, pain in the left lower portion of the abdomen could be colitis or diverticulitis.  You pass bloody or black tarry stools. MAKE SURE YOU:   Understand these instructions.  Will watch your condition.  Will get help right away if you are not doing well or get worse. Document Released: 11/26/2004 Document Revised: 05/11/2011 Document Reviewed: 10/05/2007 [facility] Patient Information 2014 [place], [place]. Keep event calendar  Follow up in 2 weeks for [place] and see me NO alcohol while taking flagyl

## 2013-02-02 ENCOUNTER — Ambulatory Visit: Payer: Medicaid Other | Admitting: Adult Health

## 2013-02-02 ENCOUNTER — Other Ambulatory Visit: Payer: Medicaid Other

## 2013-02-07 ENCOUNTER — Other Ambulatory Visit: Payer: Self-pay | Admitting: Adult Health

## 2013-02-07 ENCOUNTER — Ambulatory Visit (INDEPENDENT_AMBULATORY_CARE_PROVIDER_SITE_OTHER): Payer: Medicaid Other

## 2013-02-07 ENCOUNTER — Encounter: Payer: Self-pay | Admitting: Adult Health

## 2013-02-07 ENCOUNTER — Ambulatory Visit (INDEPENDENT_AMBULATORY_CARE_PROVIDER_SITE_OTHER): Payer: Medicaid Other | Admitting: Adult Health

## 2013-02-07 VITALS — BP 110/76 | Ht 63.0 in | Wt 121.0 lb

## 2013-02-07 DIAGNOSIS — Z3049 Encounter for surveillance of other contraceptives: Secondary | ICD-10-CM

## 2013-02-07 DIAGNOSIS — N949 Unspecified condition associated with female genital organs and menstrual cycle: Secondary | ICD-10-CM

## 2013-02-07 DIAGNOSIS — Z309 Encounter for contraceptive management, unspecified: Secondary | ICD-10-CM

## 2013-02-07 DIAGNOSIS — R109 Unspecified abdominal pain: Secondary | ICD-10-CM

## 2013-02-07 HISTORY — DX: Encounter for contraceptive management, unspecified: Z30.9

## 2013-02-07 MED ORDER — DESOGESTREL-ETHINYL ESTRADIOL 0.15-0.02/0.01 MG (21/5) PO TABS: 1.0000 | ORAL_TABLET | Freq: Every day | ORAL | Status: DC

## 2013-02-07 NOTE — Patient Instructions (Signed)
Oral Contraception Use Oral contraceptive pills (OCPs) are medicines taken to prevent pregnancy. OCPs work by preventing the ovaries from releasing eggs. The hormones in OCPs also cause the cervical mucus to thicken, preventing the sperm from entering the uterus. The hormones also cause the uterine lining to become thin, not allowing a fertilized egg to attach to the inside of the uterus. OCPs are highly effective when taken exactly as prescribed. However, OCPs do not prevent sexually transmitted diseases (STDs). Safe sex practices, such as using condoms along with an OCP, can help prevent STDs. Before taking OCPs, you may have a physical exam and Pap test. Your health care provider may also order blood tests if necessary. Your health care provider will make sure you are a good candidate for oral contraception. Discuss with your health care provider the possible side effects of the OCP you may be prescribed. When starting an OCP, it can take 2 to 3 months for the body to adjust to the changes in hormone levels in your body.  HOW TO TAKE ORAL CONTRACEPTIVE PILLS Your health care provider may advise you on how to start taking the first cycle of OCPs. Otherwise, you can:   Start on day 1 of your menstrual period. You will not need any backup contraceptive protection with this start time.   Start on the first Sunday after your menstrual period or the day you get your prescription. In these cases, you will need to use backup contraceptive protection for the first week.   Start the pill at any time of your cycle. If you take the pill within 5 days of the start of your period, you are protected against pregnancy right away. In this case, you will not need a backup form of birth control. If you start at any other time of your menstrual cycle, you will need to use another form of birth control for 7 days. If your OCP is the type called a minipill, it will protect you from pregnancy after taking it for 2 days (48  hours). After you have started taking OCPs:   If you forget to take 1 pill, take it as soon as you remember. Take the next pill at the regular time.   If you miss 2 or more pills, call your health care provider because different pills have different instructions for missed doses. Use backup birth control until your next menstrual period starts.   If you use a 28-day pack that contains inactive pills and you miss 1 of the last 7 pills (pills with no hormones), it will not matter. Throw away the rest of the nonhormone pills and start a new pill pack.  No matter which day you start the OCP, you will always start a new pack on that same day of the week. Have an extra pack of OCPs and a backup contraceptive method available in case you miss some pills or lose your OCP pack.  HOME CARE INSTRUCTIONS   Do not smoke.   Always use a condom to protect against STDs. OCPs do not protect against STDs.   Use a calendar to mark your menstrual period days.   Read the information and directions that came with your OCP. Talk to your health care provider if you have questions.  SEEK MEDICAL CARE IF:   You develop nausea and vomiting.   You have abnormal vaginal discharge or bleeding.   You develop a rash.   You miss your menstrual period.   You are losing   your hair.   You need treatment for mood swings or depression.   You get dizzy when taking the OCP.   You develop acne from taking the OCP.   You become pregnant.  SEEK IMMEDIATE MEDICAL CARE IF:   You develop chest pain.   You develop shortness of breath.   You have an uncontrolled or severe headache.   You develop numbness or slurred speech.   You develop visual problems.   You develop pain, redness, and swelling in the legs.  Document Released: 02/05/2011 Document Revised: 10/19/2012 Document Reviewed: 08/07/2012 [facility] Patient Information 2014 [place], [place]. Start pills today, use condoms Follow up in 3  months, keep event calendar if pain returns

## 2013-02-07 NOTE — Progress Notes (Signed)
Subjective:     Patient ID: Norma Kim, female   DOB: Oct 23, 1985, 27 y.o.   MRN: [id number]  HPI Norma Kim is in for [place] to assess LLQ pain.She has not had any since last visit.  Review of Systems See HPI Reviewed past medical,surgical, social and family history. Reviewed medications and allergies.     Objective:   Physical Exam BP 110/76  Ht 5\' 3"  (1.6 m)  Wt 121 lb (54.885 kg)  BMI 21.44 kg/m2  LMP 02/03/2013  Breastfeeding? No   Reviewed [place] with pt, uterus 7.7 cm x 5.5 cm x 4.5 cm, endometrium 4.4 and ovaries normal, discussed birth control and she wants to try OCs.Had headache with last OC.  Assessment:     Contraceptive management   Resolved pain Plan:     Rx mircette take 1 daily, start today, use condoms, refill x 1 year Follow up in 3 months Keep event calendar if pain returns Review handout on OC use

## 2013-03-08 ENCOUNTER — Telehealth: Payer: Self-pay | Admitting: Adult Health

## 2013-03-08 NOTE — Telephone Encounter (Signed)
The Pt states that was put on new BCP and has been bleeding ever since, also having migraines, and cramping. What should she do?

## 2013-03-08 NOTE — Telephone Encounter (Signed)
Left message to call in am with man, she was taking a bath

## 2013-03-09 ENCOUNTER — Telehealth: Payer: Self-pay | Admitting: Adult Health

## 2013-03-09 MED ORDER — NORETHINDRONE 0.35 MG PO TABS: 1.0000 | ORAL_TABLET | Freq: Every day | ORAL | Status: AC

## 2013-03-09 NOTE — Telephone Encounter (Signed)
Pt to start Micronor, take the same time every day, and to use condoms. Pt verbalized understanding.

## 2013-03-09 NOTE — Telephone Encounter (Signed)
Complains of bleeding with OCs and migraine, will change to micronor, use condoms, take same time every day

## 2013-03-15 ENCOUNTER — Emergency Department (HOSPITAL_COMMUNITY)
Admission: EM | Admit: 2013-03-15 | Discharge: 2013-03-15 | Disposition: A | Discharge status: Home / Self Care | Payer: Medicaid Other | Attending: Emergency Medicine | Admitting: Emergency Medicine

## 2013-03-15 ENCOUNTER — Emergency Department (HOSPITAL_COMMUNITY): Payer: Medicaid Other

## 2013-03-15 ENCOUNTER — Encounter (HOSPITAL_COMMUNITY): Payer: Self-pay | Admitting: Emergency Medicine

## 2013-03-15 DIAGNOSIS — Z872 Personal history of diseases of the skin and subcutaneous tissue: Secondary | ICD-10-CM | POA: Insufficient documentation

## 2013-03-15 DIAGNOSIS — Z8742 Personal history of other diseases of the female genital tract: Secondary | ICD-10-CM | POA: Insufficient documentation

## 2013-03-15 DIAGNOSIS — Z87891 Personal history of nicotine dependence: Secondary | ICD-10-CM | POA: Insufficient documentation

## 2013-03-15 DIAGNOSIS — Z8659 Personal history of other mental and behavioral disorders: Secondary | ICD-10-CM | POA: Insufficient documentation

## 2013-03-15 DIAGNOSIS — J45909 Unspecified asthma, uncomplicated: Secondary | ICD-10-CM | POA: Insufficient documentation

## 2013-03-15 DIAGNOSIS — Z880 Allergy status to penicillin: Secondary | ICD-10-CM | POA: Insufficient documentation

## 2013-03-15 DIAGNOSIS — Y929 Unspecified place or not applicable: Secondary | ICD-10-CM | POA: Insufficient documentation

## 2013-03-15 DIAGNOSIS — Z79899 Other long term (current) drug therapy: Secondary | ICD-10-CM | POA: Insufficient documentation

## 2013-03-15 DIAGNOSIS — Y939 Activity, unspecified: Secondary | ICD-10-CM | POA: Insufficient documentation

## 2013-03-15 DIAGNOSIS — IMO0002 Reserved for concepts with insufficient information to code with codable children: Secondary | ICD-10-CM | POA: Insufficient documentation

## 2013-03-15 DIAGNOSIS — S93409A Sprain of unspecified ligament of unspecified ankle, initial encounter: Secondary | ICD-10-CM | POA: Insufficient documentation

## 2013-03-15 MED ORDER — NAPROXEN 500 MG PO TABS: 500.0000 mg | ORAL_TABLET | Freq: Two times a day (BID) | ORAL | Status: AC

## 2013-03-15 NOTE — Discharge Instructions (Signed)
Ankle Sprain °An ankle sprain is an injury to the strong, fibrous tissues (ligaments) that hold your ankle bones together.  °HOME CARE  °· Put ice on your ankle for 1 2 days or as told by your doctor. °· Put ice in a plastic bag. °· Place a towel between your skin and the bag. °· Leave the ice on for 15-20 minutes at a time, every 2 hours while you are awake. °· Only take medicine as told by your doctor. °· Raise (elevate) your injured ankle above the level of your heart as much as possible for 2 3 days. °· Use crutches if your doctor tells you to. Slowly put your own weight on the affected ankle. Use the crutches until you can walk without pain. °· If you have a plaster splint: °· Do not rest it on anything harder than a pillow for 24 hours. °· Do not put weight on it. °· Do not get it wet. °· Take it off to shower or bathe. °· If given, use an elastic wrap or support stocking for support. Take the wrap off if your toes lose feeling (numb), tingle, or turn cold or blue. °· If you have an air splint: °· Add or let out air to make it comfortable. °· Take it off at night and to shower and bathe. °· Wiggle your toes and move your ankle up and down often while you are wearing it. °GET HELP RIGHT AWAY IF:  °· Your toes lose feeling (numb) or turn blue. °· You have severe pain that is increasing. °· You have rapidly increasing bruising or puffiness (swelling). °· Your toes feel very cold. °· You lose feeling in your foot. °· Your medicine does not help your pain. °MAKE SURE YOU:  °· Understand these instructions. °· Will watch your condition. °· Will get help right away if you are not doing well or get worse. °Document Released: 08/05/2007 Document Revised: 11/11/2011 Document Reviewed: 08/31/2011 °[employer]® Patient Information ©2014 [employer]. ° °

## 2013-03-15 NOTE — ED Provider Notes (Signed)
CSN: [id number]     Arrival date & time 03/15/13  2158 History   First MD Initiated Contact with Patient 03/15/13 2213     Chief Complaint  Patient presents with  . Ankle Pain   (Consider location/radiation/quality/duration/timing/severity/associated sxs/prior Treatment) Patient is a 28 y.o. female presenting with ankle pain. The history is provided by the patient.  Ankle Pain Location:  Ankle and foot Time since incident:  3 days Injury: yes   Mechanism of injury comment:  Direct blow Ankle location:  R ankle Foot location:  R foot Pain details:    Quality:  Aching and throbbing   Radiates to:  Does not radiate   Severity:  Mild   Onset quality:  Sudden   Timing:  Constant   Progression:  Unchanged Chronicity:  New Dislocation: no   Foreign body present:  No foreign bodies Relieved by:  Nothing Worsened by:  Bearing weight, flexion and extension Associated symptoms: no back pain, no decreased ROM, no fever, no neck pain, no numbness, no stiffness, no swelling and no tingling     Past Medical History  Diagnosis Date  . Asthma   . Bronchitis   . Seasonal allergies   . Depression   . Anxiety   . Bipolar 1 disorder   . Hirsutism   . Left sided abdominal pain 01/19/2013  . Vaginal discharge 01/19/2013  . Contraceptive management 02/07/2013   Past Surgical History  Procedure Laterality Date  . Dilation and curettage of uterus     Family History  Problem Relation Age of Onset  . Hypertension Mother   . Hypertension Father   . Anesthesia problems Neg Hx   . Cancer Maternal Aunt   . Stroke Maternal Aunt   . Heart disease Maternal Uncle   . Cancer Maternal Uncle   . Cancer Paternal Aunt   . Cancer Paternal Uncle   . Hypertension Maternal Uncle   . Hypertension Maternal Uncle   . Polycystic ovary syndrome Sister    History  Substance Use Topics  . Smoking status: Former Smoker -- 0.25 packs/day for 11 years    Types: Cigarettes    Quit date: 12/01/2010  .  Smokeless tobacco: Never Used  . Alcohol Use: No   OB History   Grav Para Term Preterm Abortions TAB SAB Ect Mult Living   4 3 1 2 1  1   3      Review of Systems  Constitutional: Negative for fever and chills.  Genitourinary: Negative for dysuria and difficulty urinating.  Musculoskeletal: Positive for arthralgias. Negative for back pain, joint swelling, neck pain and stiffness.  Skin: Negative for color change, rash and wound.  Neurological: Negative for weakness and numbness.  All other systems reviewed and are negative.    Allergies  Amoxicillin; Other; and Penicillins  Home Medications   Current Outpatient Rx  Name  Route  Sig  Dispense  Refill  . albuterol (PROVENTIL HFA;VENTOLIN HFA) 108 (90 BASE) MCG/ACT inhaler   Inhalation   Inhale 2 puffs into the lungs every 6 (six) hours as needed. For shortness of breath         . Coenzyme Q10 (CO Q 10 PO)   Oral   Take 1 capsule by mouth daily.         . Multiple Vitamins-Minerals (MULTIVITAMIN WITH MINERALS) tablet   Oral   Take 1 tablet by mouth daily.         . naproxen (NAPROSYN) 500 MG tablet  Oral   Take 1 tablet (500 mg total) by mouth 2 (two) times daily with a meal.   30 tablet   0   . norethindrone (MICRONOR,CAMILA,ERRIN) 0.35 MG tablet   Oral   Take 1 tablet (0.35 mg total) by mouth daily.   1 Package   11   . vitamin E 100 UNIT capsule   Oral   Take 100 Units by mouth daily.          BP 112/74  Pulse 108  Temp(Src) 98.1 F (36.7 C) (Oral)  Resp 18  Ht 5' 3.5" (1.613 m)  Wt 122 lb (55.339 kg)  BMI 21.27 kg/m2  SpO2 99%  LMP 03/08/2013 Physical Exam  Nursing note and vitals reviewed. Constitutional: She is oriented to person, place, and time. She appears well-developed and well-nourished. No distress.  HENT:  Head: Normocephalic and atraumatic.  Cardiovascular: Normal rate, regular rhythm, normal heart sounds and intact distal pulses.   Pulmonary/Chest: Effort normal and breath  sounds normal. No respiratory distress.  Musculoskeletal: She exhibits tenderness. She exhibits no edema.       Right ankle: She exhibits normal range of motion, no swelling, no deformity, no laceration and normal pulse. Tenderness. Lateral malleolus tenderness found. No head of 5th metatarsal and no proximal fibula tenderness found. Achilles tendon normal.       Feet:  Lateral right ankle is ttp,  ROM is preserved.  DP pulse is brisk,distal sensation intact.  No erythema, abrasion, bruising, edema or bony deformity.  No proximal tenderness.  Neurological: She is alert and oriented to person, place, and time. She exhibits normal muscle tone. Coordination normal.  Skin: Skin is warm and dry.    ED Course  Procedures (including critical care time) Labs Review Labs Reviewed - No data to display Imaging Review Dg Ankle Complete Right  03/15/2013   CLINICAL DATA:  Ankle pain after fall  EXAM: RIGHT ANKLE - COMPLETE 3+ VIEW  COMPARISON:  10/29/2012  FINDINGS: There is no evidence of fracture, dislocation, or joint effusion. There is no evidence of arthropathy or other focal bone abnormality.  IMPRESSION: Negative.   Electronically Signed   By: Tiburcio PeaJonathan  Watts M.D.   On: 03/15/2013 22:40    EKG Interpretation   None       MDM    ASo splint applied, pain improved, remains NV intact.    X-ray results reviewed.  Pt agrees to ASO splint, symptomatic treatment with naprosyn, ice and elevate.  Referral given for Dr. Romeo AppleHarrison.  VSS.  Patient stable for d/c  Kaio Kuhlman L. Daryl Beehler, PA-C 03/15/13 2250

## 2013-03-15 NOTE — ED Notes (Signed)
Pt c/o right ankle pain since Sunday. Pt denies any injury.

## 2013-03-18 NOTE — ED Provider Notes (Signed)
Medical screening examination/treatment/procedure(s) were performed by non-physician practitioner and as supervising physician I was immediately available for consultation/collaboration.  EKG Interpretation   None        Donnetta HutchingBrian Sanah Kraska, MD 03/18/13 [phone]

## 2013-03-24 ENCOUNTER — Telehealth: Payer: Self-pay | Admitting: Adult Health

## 2013-03-24 NOTE — Telephone Encounter (Signed)
Complains of weight gain and swelling make appt

## 2013-03-28 ENCOUNTER — Encounter: Payer: Self-pay | Admitting: Adult Health

## 2013-03-28 ENCOUNTER — Ambulatory Visit (INDEPENDENT_AMBULATORY_CARE_PROVIDER_SITE_OTHER): Payer: Medicaid Other | Admitting: Adult Health

## 2013-03-28 VITALS — BP 120/76 | Ht 63.0 in | Wt 121.0 lb

## 2013-03-28 DIAGNOSIS — Z3049 Encounter for surveillance of other contraceptives: Secondary | ICD-10-CM

## 2013-03-28 DIAGNOSIS — Z309 Encounter for contraceptive management, unspecified: Secondary | ICD-10-CM

## 2013-03-28 NOTE — Progress Notes (Signed)
Subjective:     Patient ID: Norma Kim, female   DOB: 10/02/1985, 28 y.o.   MRN: [id number]  HPI Norma Kim is a 28 year old white female on micronor complains of weight gain but weight is same, 121.she says she feels better on the pill.But says her "stomach pokes out".  Review of Systems See HPI Reviewed past medical,surgical, social and family history. Reviewed medications and allergies.     Objective:   Physical Exam BP 120/76  Ht 5\' 3"  (1.6 m)  Wt 121 lb (54.885 kg)  BMI 21.44 kg/m2  LMP 03/08/2013  Breastfeeding? No   discussed staying on the pill and follow up in 6 weeks, and try some crunches  Assessment:     Contraceptive management    Plan:     Continue Micronor Follow up as scheduled

## 2013-03-28 NOTE — Patient Instructions (Signed)
Continue Micronor and follow up as scheduled

## 2013-03-30 ENCOUNTER — Encounter: Payer: Self-pay | Admitting: Orthopedic Surgery

## 2013-03-30 ENCOUNTER — Ambulatory Visit (INDEPENDENT_AMBULATORY_CARE_PROVIDER_SITE_OTHER): Payer: Medicaid Other | Admitting: Orthopedic Surgery

## 2013-03-30 VITALS — BP 116/74 | Ht 63.0 in | Wt 112.0 lb

## 2013-03-30 DIAGNOSIS — S99921A Unspecified injury of right foot, initial encounter: Secondary | ICD-10-CM | POA: Insufficient documentation

## 2013-03-30 DIAGNOSIS — S99919A Unspecified injury of unspecified ankle, initial encounter: Secondary | ICD-10-CM

## 2013-03-30 DIAGNOSIS — S93409A Sprain of unspecified ligament of unspecified ankle, initial encounter: Secondary | ICD-10-CM | POA: Insufficient documentation

## 2013-03-30 DIAGNOSIS — S99929A Unspecified injury of unspecified foot, initial encounter: Secondary | ICD-10-CM

## 2013-03-30 DIAGNOSIS — S8990XA Unspecified injury of unspecified lower leg, initial encounter: Secondary | ICD-10-CM

## 2013-03-30 MED ORDER — HYDROCODONE-ACETAMINOPHEN 5-325 MG PO TABS: 1.0000 | ORAL_TABLET | Freq: Three times a day (TID) | ORAL | Status: AC | PRN

## 2013-03-30 NOTE — Progress Notes (Signed)
Patient ID: Norma Kim, female   DOB: 11/15/1985, 28 y.o.   MRN: [id number]  Chief Complaint  Patient presents with  . Ankle Pain    Right ankle sprain d/t injury 03/13/13. Referred by Alyson LocketMichelle Muse, PA   BP 116/74  Ht 5\' 3"  (1.6 m)  Wt 112 lb (50.803 kg)  BMI 19.84 kg/m2  LMP 03/08/2013  Chief complaint as stated. 28 year old female presents after hitting her leg against a bedpost hearing a pop and lateral ankle pain. X-rays were negative injury was January 12. She's had for ankle sprains never been treated with any therapy presents with sharp throbbing 9/10 constant pains over the foot and ankle laterally she reports locking swelling bruising pain with weightbearing  Review of systems weight gain and fatigue wheezing cough anxiety depression dizziness easy bruising cold intolerance heat intolerance seasonal allergies all other systems were negative  Allergic to penicillin amoxicillin history of anxiety and depression headaches no surgery family history of heart disease arthritis cancer asthma and diabetes  Social history single divorced  Stay-at-home mom  Smell smoking no alcohol no drug use  Vs: BP 116/74  Ht 5\' 3"  (1.6 m)  Wt 112 lb (50.803 kg)  BMI 19.84 kg/m2  LMP 03/08/2013  The patient is well-developed and well-nourished grooming and hygiene are normal. They are oriented to time person and place. Mood and affect normal.   Ambulation status she is ambulating with crutches nonweightbearing with an ASO brace  Right ankle exam shows tenderness over the anterior talofibular ligament with tenderness over the lateral portion of her foot with a bruise there perhaps the area of trauma stability tests were normal strength was normal she had poor range of motion dorsiflexion and tenderness in the Achilles which was intact provocative test of anterior drawer normal  Skin is normal without rash lesion or ulcer  Pulse and temperature are normal without edema or swelling  Normal  sensation with no pathologic reflexes  X-ray was reviewed x-ray was negative   Encounter Diagnoses  Name Primary?  . Sprain of ankle, unspecified site Yes  . Right foot injury     Recommend home exercises ASO brace gradual return to normal activity starting with crutches full weightbearing  Followup as needed no surgical treatment necessary

## 2013-03-30 NOTE — Patient Instructions (Addendum)
Weight bearing on the foot Wear brace Should be off crutches in one week Do exercises 100 times a day move foot up and down Continue Ibuprofen

## 2013-04-04 ENCOUNTER — Telehealth: Payer: Self-pay | Admitting: Adult Health

## 2013-04-04 NOTE — Telephone Encounter (Signed)
Pt states that it hurts to pee and had a white discharge. Pt states that she has tried the monistat 1 day treatment but it didn't help. I spoke with JAG and she advised the pt needed an appointment. Pt was advised of the above and verbalized understanding, and was switched up front for an appointment.

## 2013-04-06 ENCOUNTER — Ambulatory Visit: Payer: Medicaid Other | Admitting: Adult Health

## 2013-05-08 ENCOUNTER — Ambulatory Visit: Payer: Medicaid Other | Admitting: Adult Health

## 2013-10-28 ENCOUNTER — Encounter (INDEPENDENT_AMBULATORY_CARE_PROVIDER_SITE_OTHER): Payer: Self-pay | Admitting: Family Medicine

## 2013-10-28 ENCOUNTER — Ambulatory Visit (INDEPENDENT_AMBULATORY_CARE_PROVIDER_SITE_OTHER): Payer: MEDICAID | Admitting: Family Medicine

## 2013-10-28 VITALS — BP 110/69 | HR 77 | Temp 97.6°F | Ht 63.0 in | Wt 110.8 lb

## 2013-10-28 DIAGNOSIS — K121 Other forms of stomatitis: Secondary | ICD-10-CM

## 2013-10-28 DIAGNOSIS — K1230 Oral mucositis (ulcerative), unspecified: Secondary | ICD-10-CM

## 2013-10-28 MED ORDER — NYSTATIN 100,000 UNIT/ML ORAL SUSPENSION
5.00 mL | Freq: Four times a day (QID) | ORAL | Status: AC
Start: 2013-10-28 — End: 2013-11-02

## 2013-10-28 NOTE — Progress Notes (Signed)
Lindsay Mccullough  Date of Service: 10/28/2013    Chief complaint:   Chief Complaint   Patient presents with    Mouth Pain     Subjective  Patient presents with pelvic pain for the past 2 days. Burning sensation which ever she eats. There is no redness or sores noted. She does have small area of angular cheilitis.    Current Outpatient Prescriptions   Medication Sig    nystatin (MYCOSTATIN) 100,000 unit/mL Oral Suspension Take 5 mL by mouth Four times a day for 5 days     Allergies   Allergen Reactions    Amoxicillin Anaphylaxis    Penicillins Hives/ Urticaria         Objective  Vitals: BP 110/69 mmHg   Pulse 77   Temp(Src) 36.4 C (97.6 F) (Oral)   Ht 1.6 m ( )   Wt 50.259 kg (110 lb 12.8 oz)   BMI 19.63 kg/m2  General: no distress  HENT:ENT without erythema or injection, mucous membranes moist.  Neck: no adenopathy  Lungs: clear to auscultation bilaterally.   Cardiovascular:    Heart regular rate and rhythm    Assessment/Plan  1. Stomatitis        Orders Placed This Encounter    nystatin (MYCOSTATIN) 100,000 unit/mL Oral Suspension        Treat her proximal thrush infection due to the angular cheilitis. If symptoms do not improve she is to seek followup possibly seeing ENT.    Sunday Corn, MD

## 2013-10-28 NOTE — Progress Notes (Signed)
BP 110/69 mmHg  Pulse 77  Temp(Src) 36.4 C (97.6 F) (Oral)  Ht 1.6 m ( )  Wt 50.259 kg (110 lb 12.8 oz)  BMI 19.63 kg/m2  Maximiano Coss, RTR  10/28/2013, 15:27

## 2013-10-29 ENCOUNTER — Encounter (INDEPENDENT_AMBULATORY_CARE_PROVIDER_SITE_OTHER): Payer: Self-pay

## 2013-10-29 NOTE — Progress Notes (Signed)
I have attempted to call and follow up with this patient seen at Urgent Care yesterday.  No answer.

## 2013-12-20 ENCOUNTER — Ambulatory Visit (INDEPENDENT_AMBULATORY_CARE_PROVIDER_SITE_OTHER): Payer: Medicaid Other | Admitting: Family Medicine

## 2013-12-20 ENCOUNTER — Encounter (INDEPENDENT_AMBULATORY_CARE_PROVIDER_SITE_OTHER): Payer: Self-pay | Admitting: Family Medicine

## 2013-12-20 VITALS — BP 111/73 | HR 90 | Temp 97.8°F | Resp 14 | Ht 63.0 in | Wt 119.1 lb

## 2013-12-20 DIAGNOSIS — G8929 Other chronic pain: Secondary | ICD-10-CM

## 2013-12-20 DIAGNOSIS — H6000 Abscess of external ear, unspecified ear: Secondary | ICD-10-CM

## 2013-12-20 DIAGNOSIS — M549 Dorsalgia, unspecified: Secondary | ICD-10-CM

## 2013-12-20 DIAGNOSIS — H6002 Abscess of left external ear: Secondary | ICD-10-CM

## 2013-12-20 MED ORDER — TRAMADOL HCL 50 MG PO TABS
50.0000 mg | ORAL_TABLET | Freq: Four times a day (QID) | ORAL | Status: DC | PRN
Start: 2013-12-20 — End: 2014-04-19

## 2013-12-20 MED ORDER — OFLOXACIN 0.3 % OT SOLN
5.0000 [drp] | Freq: Two times a day (BID) | OTIC | Status: AC
Start: 2013-12-20 — End: 2013-12-27

## 2013-12-20 MED ORDER — SULFAMETHOXAZOLE-TRIMETHOPRIM 800-160 MG PO TABS
1.0000 | ORAL_TABLET | Freq: Two times a day (BID) | ORAL | Status: AC
Start: 2013-12-20 — End: 2013-12-27

## 2013-12-20 NOTE — Patient Instructions (Signed)
Back Care Tips    These are things you can do to prevent a recurrence of acute back pain and to reduce symptoms from chronic back pain:   Maintain a healthy weight. If you are overweight, losing weight will help most types of back pain.   Exercise is an important part of recovery from most types of back pain. The back is supported by the muscles behind and in front of the spine. This means both the back muscles and the abdominal muscles must be strengthened to provide better support for your spine.   Swimming and brisk walking are good overall exercises to improve your fitness level.   Practice safe lifting methods (below).   Practice good posture when sitting, standing and walking. Avoid prolonged sitting. This puts more stress on the lower back than standing or walking.   Wear quality shoes with sufficient arch support. Foot and ankle alignment can affect back symptoms. Women should avoid high heels.   Therapeutic massage can help relieve acute and chronic back pain.   During the first two days after an acute injury or flare-up of chronic back pain, apply anice packto the painful area for 20 minutes every 2-4 hours. This will reduce swelling and pain. Heat (hot shower, hot bath, or heating pad) works well for muscle spasm. You can start with ice, then switch to heat after two days. Some patients feel best alternating ice and heat treatments. Use the one method that feels the best to you.   You may use acetaminophen (Tylenol) or ibuprofen (Motrin, Advil) to control pain, unless another medicine was prescribed. [NOTE: If you have chronic liver or kidney disease or ever had a stomach ulcer or GI bleeding, talk with your doctor before using these medicines.]  Lumbar Stretch   Here is a simple stretching exercise that will help relax muscle spasm and keep your back more limber. If exercise makes your back pain worse, don't do it.   Lie on your back with your knees bent and both feet on the  ground.   Slowly raise your left knee to your chest as you flatten your lower back against the floor. Hold for 5 seconds.   Relax and repeat the exercise with your right knee.   Do 10 of these exercises for each leg.  Safe Lifting Method    Don't bend over at the waist to lift an object off the floor. Instead, bend your knees and hips in a squat.   Keep your back and head upright.   Hold the object close to your body, directly in front of you.   Straighten your legs to lift the object.   Lower the object to the floor in the reverse fashion.   If you must slide something across the floor, push it.  Posture Tips   SITTING  Sit in chairs with straight backs or low-back support. Keep your knees a little higher than your hips. If necessary, use a low stool to prop your feet on, so your feet are resting on a solid surface.  When driving, sit up straight. Adjust the seat forward so you are not leaning toward the steering wheel. A small pillow or rolled towel behind your lower back may help if you are driving long distances.  STANDING  When standing for long periods, shift most of your weight to one leg at a time. Alternate legs every few minutes.  SLEEPING  The best way to sleep is on your side with your knees   bent. Put a low pillow under your head to support your neck in a neutral spine position. Avoid thick pillows that bend your neck to one side. Put a pillow between your legs to further relax your lower back. If you sleep on your back, put pillows under your knees to support your legs in a slightly flexed position. Use a firm mattress. If your mattress sags, replace it, or use a 1/2-inch plywood board under the mattress to add support.  Follow Up with your doctor or as directed by our staff.  [NOTE: If X-rays, a CT scan or an MRI scan were taken, they will be reviewed by a radiologist. You will be notified of any new findings that may affect your care.]  Return Promptly or contact your doctor if any of  the following occur:   Pain becomes worse or spreads to your arms or legs   Weakness or numbness in one or both arms or legs   Loss of bowel or bladder control   Numbness in the groin area   2000-2015 [employer] [address] [place], [place] [address]. All rights reserved. This information is not intended as a substitute for professional medical care. Always follow your healthcare professional's instructions.        Abscess (Antibiotic Treatment Only)  An abscess (sometimes called a "boil") occurs when bacteria get trapped under the skin and begin to grow. Pus forms inside the abscess as the body responds to the bacteria. An abscess can occur with an insect bite, ingrown hair, blocked oil gland, pimple, cyst, or puncture wound.  In the early stages, redness and tenderness are the only symptoms. Sometimes, this stage can be treated with antibiotics alone. If the abscess does not respond to antibiotic treatment, it will need to be drained with a small cut, under local anesthesia.  Home care  The following will help you care for your abscess at home:   Soak the wound in hot water or apply hot packs (small towel soaked in hot water) to the area for 20 minutes at a time. Do this three to four times a day.   Apply antibiotic cream or ointment onto the skin 3-4 times a day, unless something else was prescribed. Some ointments include an antibiotic plus a local pain reliever.   If your doctor prescribed antibiotics, do not stop taking this medication until you have finished the prescribed course or the doctor tells you to stop.   You may use an over-the-counter pain medication to control pain, unless another pain medicine was prescribed. If you have chronic liver or kidney disease or ever had a stomach ulcer or GI bleeding, talk with your doctor before using these any of these.  Follow-up care  Follow up with your health care provider as advised by our staff. Look at your wound each day for the  signs of worsening infection listed below.  When to seek medical advice  Call your health care provider right away if any of these occur:   An increase in redness or swelling   Red streaks in the skin leading away from the abscess   An increase in local pain or swelling   Fever of 100.43F (38C) or higher, or as directed by your health care provider   Pus or fluid coming from the abscess   2000-2015 [employer] [address] [place], [place] [address]. All rights reserved. This information is not intended as a substitute for professional medical  care. Always follow your healthcare professional's instructions.

## 2013-12-20 NOTE — Progress Notes (Signed)
Subjective:    Patient ID: Regina Adams is a 28 y.o. female.    HPI Comments: H/o chronic back pain and seen by ortho/chiropracter. Had xray/mri.    Otalgia   There is pain in the left ear. This is a new problem. The current episode started in the past 7 days. The problem occurs constantly. The problem has been gradually worsening. The pain is moderate. Pertinent negatives include no abdominal pain, coughing, diarrhea, ear discharge, headaches, hearing loss, neck pain, rash, rhinorrhea, sore throat or vomiting.       The following portions of the patient's history were reviewed and updated as appropriate: allergies, current medications, past family history, past medical history, past social history, past surgical history and problem list.    Review of Systems   Constitutional: Negative.  Negative for fever, chills, diaphoresis, activity change, appetite change, fatigue and unexpected weight change.   HENT: Positive for ear pain. Negative for congestion, ear discharge, hearing loss, rhinorrhea and sore throat.    Respiratory: Negative for cough.    Gastrointestinal: Negative for vomiting, abdominal pain and diarrhea.   Musculoskeletal: Positive for myalgias and back pain. Negative for neck pain.   Skin: Negative for rash.   Neurological: Negative for headaches.         Objective:    BP 111/73 mmHg  Pulse 90  Temp(Src) 97.8 F (36.6 C) (Oral)  Resp 14  Ht 1.6 m (5\' 3" )  Wt 54.023 kg (119 lb 1.6 oz)  BMI 21.10 kg/m2  LMP 12/02/2013 (Approximate)    Physical Exam   Constitutional: She is oriented to person, place, and time. She appears well-developed and well-nourished. No distress.   HENT:   Head: Normocephalic and atraumatic.   Right Ear: External ear normal.   Mouth/Throat: Oropharynx is clear and moist. No oropharyngeal exudate.   On left ear canal- macular,erythematous lesion,tender   Eyes: Conjunctivae and EOM are normal. Pupils are equal, round, and reactive to light. Right eye exhibits no  discharge. Left eye exhibits no discharge.   Neck: Normal range of motion. Neck supple.   Cardiovascular: Normal rate, regular rhythm and normal heart sounds.    No murmur heard.  Pulmonary/Chest: Effort normal and breath sounds normal. No respiratory distress. She has no wheezes. She has no rales.   Musculoskeletal: She exhibits no edema.   Tenderness with mild touch on entire spine.   Lymphadenopathy:     She has no cervical adenopathy.   Neurological: She is alert and oriented to person, place, and time.   Skin: Skin is warm. No rash noted. She is not diaphoretic.   Psychiatric: She has a normal mood and affect.         Assessment and Plan:       Merelyn was seen today for otalgia.    Diagnoses and associated orders for this visit:    Furuncle of ear canal  - ofloxacin (FLOXIN) 0.3 % otic solution; Place 5 drops into the left ear 2 (two) times daily.  - sulfamethoxazole-trimethoprim (BACTRIM DS) 800-160 MG per tablet; Take 1 tablet by mouth every 12 (twelve) hours.  - traMADol (ULTRAM) 50 MG tablet; Take 1 tablet (50 mg total) by mouth every 6 (six) hours as needed for Pain.      2. Chronic back pain  Advise to f/u with ortho/pcp soon.    Pt denies h/o Seizure ,so prescribed tramadol. Side effects discussed. ADVISED NOT TO DRIVE OR USE MACHINERY.  Advised plenty of liquids,tylenol/ibuprofen PRN  for pain/fever.F/U with PCP/urgent care soon if symptoms persists/gets worse more than 2-3 days.  Advise to watch for worsening symptoms,with those seek medical help immediatly  F/u with PCP  F/u PRN    Etheleen Sia, MD  [facility]  12/20/2013  4:03 PM

## 2013-12-23 ENCOUNTER — Telehealth (INDEPENDENT_AMBULATORY_CARE_PROVIDER_SITE_OTHER): Payer: Self-pay

## 2013-12-23 NOTE — Telephone Encounter (Signed)
Courtesy call to patient. Patient states they are feeling better.    Regina Adams D [name]  11:20 AM

## 2014-01-01 ENCOUNTER — Encounter: Payer: Self-pay | Admitting: Orthopedic Surgery

## 2014-01-06 ENCOUNTER — Encounter (INDEPENDENT_AMBULATORY_CARE_PROVIDER_SITE_OTHER): Payer: Self-pay

## 2014-01-06 ENCOUNTER — Ambulatory Visit (INDEPENDENT_AMBULATORY_CARE_PROVIDER_SITE_OTHER): Payer: Medicaid (Managed Care) | Attending: Family Medicine

## 2014-01-06 ENCOUNTER — Ambulatory Visit (INDEPENDENT_AMBULATORY_CARE_PROVIDER_SITE_OTHER): Payer: Medicaid (Managed Care) | Admitting: Family Medicine

## 2014-01-06 VITALS — BP 106/74 | HR 80 | Temp 97.8°F | Resp 16 | Ht 63.0 in | Wt 120.9 lb

## 2014-01-06 DIAGNOSIS — M545 Low back pain, unspecified: Secondary | ICD-10-CM

## 2014-01-06 MED ORDER — CYCLOBENZAPRINE HCL 5 MG PO TABS
5.0000 mg | ORAL_TABLET | Freq: Three times a day (TID) | ORAL | Status: DC | PRN
Start: 2014-01-06 — End: 2014-04-19

## 2014-01-06 MED ORDER — DICLOFENAC SODIUM 75 MG PO TBEC
75.0000 mg | DELAYED_RELEASE_TABLET | Freq: Two times a day (BID) | ORAL | Status: AC
Start: 2014-01-06 — End: 2014-01-16

## 2014-01-06 NOTE — Patient Instructions (Signed)
Back Care Tips    These are things you can do to prevent a recurrence of acute back pain and to reduce symptoms from chronic back pain:   Maintain a healthy weight. If you are overweight, losing weight will help most types of back pain.   Exercise is an important part of recovery from most types of back pain. The back is supported by the muscles behind and in front of the spine. This means both the back muscles and the abdominal muscles must be strengthened to provide better support for your spine.   Swimming and brisk walking are good overall exercises to improve your fitness level.   Practice safe lifting methods (below).   Practice good posture when sitting, standing and walking. Avoid prolonged sitting. This puts more stress on the lower back than standing or walking.   Wear quality shoes with sufficient arch support. Foot and ankle alignment can affect back symptoms. Women should avoid high heels.   Therapeutic massage can help relieve acute and chronic back pain.   During the first two days after an acute injury or flare-up of chronic back pain, apply anice packto the painful area for 20 minutes every 2-4 hours. This will reduce swelling and pain. Heat (hot shower, hot bath, or heating pad) works well for muscle spasm. You can start with ice, then switch to heat after two days. Some patients feel best alternating ice and heat treatments. Use the one method that feels the best to you.   You may use acetaminophen (Tylenol) or ibuprofen (Motrin, Advil) to control pain, unless another medicine was prescribed. [NOTE: If you have chronic liver or kidney disease or ever had a stomach ulcer or GI bleeding, talk with your doctor before using these medicines.]  Lumbar Stretch   Here is a simple stretching exercise that will help relax muscle spasm and keep your back more limber. If exercise makes your back pain worse, don't do it.   Lie on your back with your knees bent and both feet on the  ground.   Slowly raise your left knee to your chest as you flatten your lower back against the floor. Hold for 5 seconds.   Relax and repeat the exercise with your right knee.   Do 10 of these exercises for each leg.  Safe Lifting Method    Don't bend over at the waist to lift an object off the floor. Instead, bend your knees and hips in a squat.   Keep your back and head upright.   Hold the object close to your body, directly in front of you.   Straighten your legs to lift the object.   Lower the object to the floor in the reverse fashion.   If you must slide something across the floor, push it.  Posture Tips   SITTING  Sit in chairs with straight backs or low-back support. Keep your knees a little higher than your hips. If necessary, use a low stool to prop your feet on, so your feet are resting on a solid surface.  When driving, sit up straight. Adjust the seat forward so you are not leaning toward the steering wheel. A small pillow or rolled towel behind your lower back may help if you are driving long distances.  STANDING  When standing for long periods, shift most of your weight to one leg at a time. Alternate legs every few minutes.  SLEEPING  The best way to sleep is on your side with your knees   bent. Put a low pillow under your head to support your neck in a neutral spine position. Avoid thick pillows that bend your neck to one side. Put a pillow between your legs to further relax your lower back. If you sleep on your back, put pillows under your knees to support your legs in a slightly flexed position. Use a firm mattress. If your mattress sags, replace it, or use a 1/2-inch plywood board under the mattress to add support.  Follow Up with your doctor or as directed by our staff.  [NOTE: If X-rays, a CT scan or an MRI scan were taken, they will be reviewed by a radiologist. You will be notified of any new findings that may affect your care.]  Return Promptly or contact your doctor if any of  the following occur:   Pain becomes worse or spreads to your arms or legs   Weakness or numbness in one or both arms or legs   Loss of bowel or bladder control   Numbness in the groin area   2000-2015 [employer] [address], [place] [address]. All rights reserved. This information is not intended as a substitute for professional medical care. Always follow your healthcare professional's instructions.
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
## 2014-01-09 ENCOUNTER — Telehealth (INDEPENDENT_AMBULATORY_CARE_PROVIDER_SITE_OTHER): Payer: Self-pay

## 2014-01-09 IMAGING — CT CT ABD-PELV W/O CM
2 of 3 series · 9 of 46 positions shown, 11 images · non-contrast
Comparison: February 05, 2007

CLINICAL DATA: Flank pain and hematuria

EXAM:
CT ABDOMEN AND PELVIS WITHOUT CONTRAST
TECHNIQUE: Multidetector CT imaging of the abdomen and pelvis was performed
following the standard protocol without oral or intravenous
contrast.

[Series 4: mpr coronal (id) · coronal · 0.74mm/px · 8 of 70 slices shown, 9 images]
[im 8/70  soft-tissue]
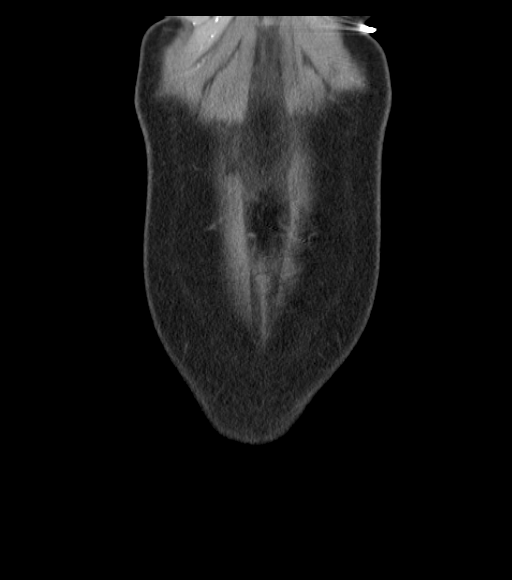
[im 8/70  bone]
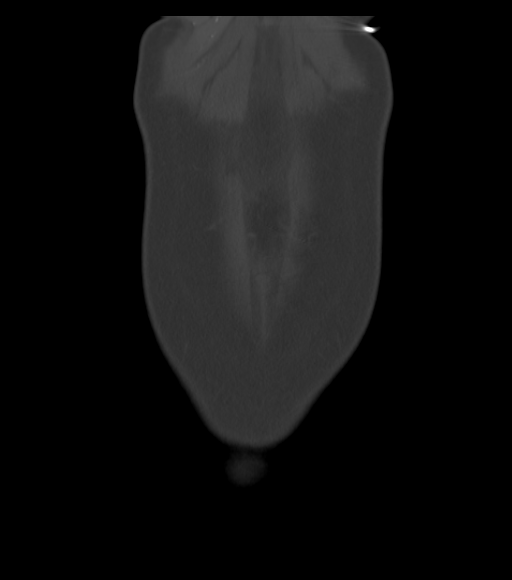
[im 16/70  soft-tissue]
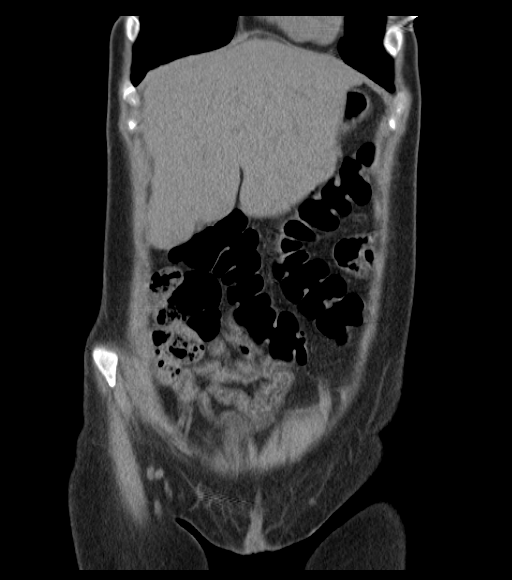
[im 24/70  soft-tissue]
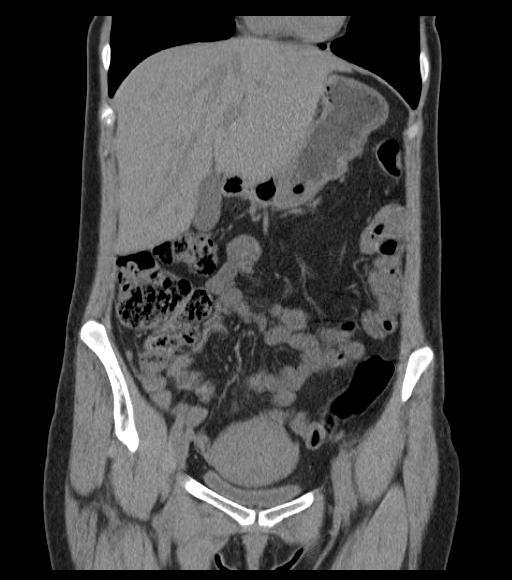
[im 31/70  soft-tissue]
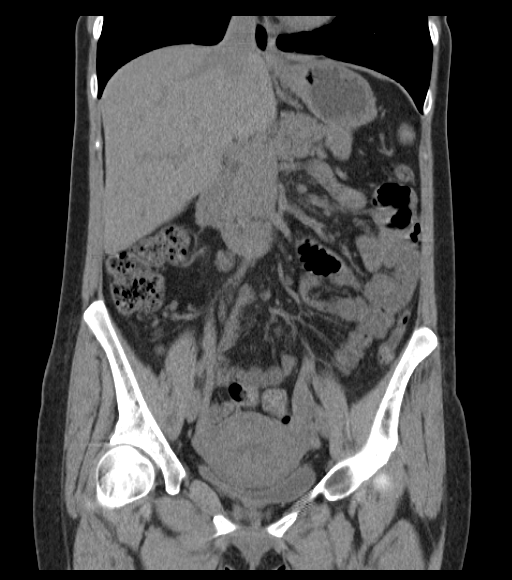
[im 39/70  soft-tissue]
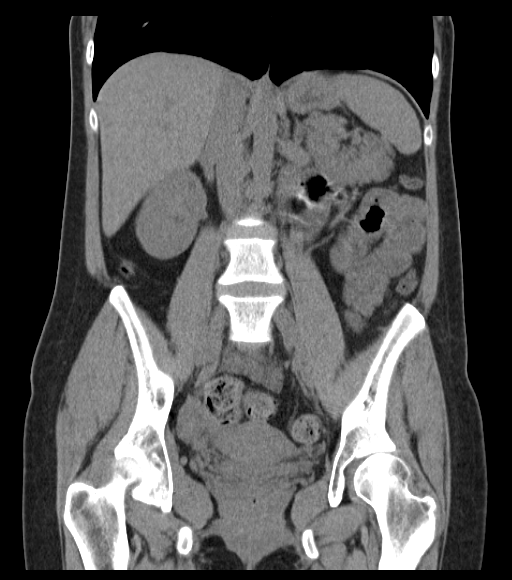
[im 47/70  soft-tissue]
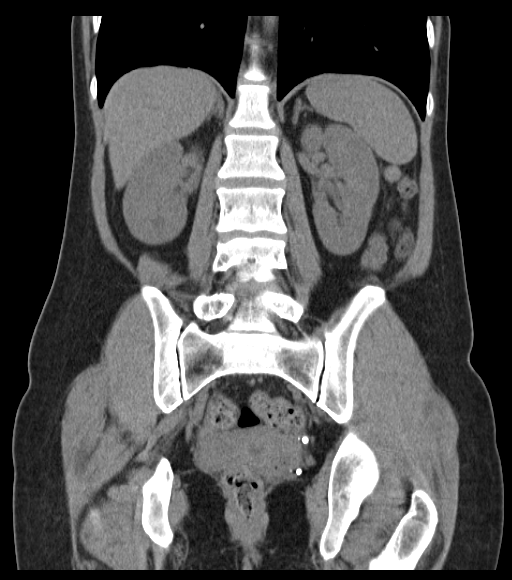
[im 54/70  soft-tissue]
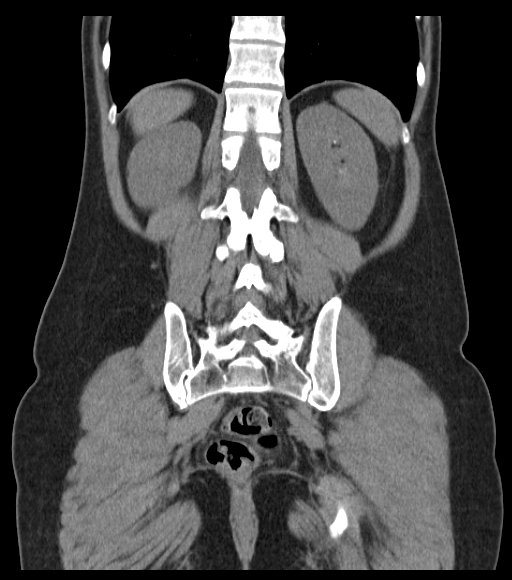
[im 62/70  soft-tissue]
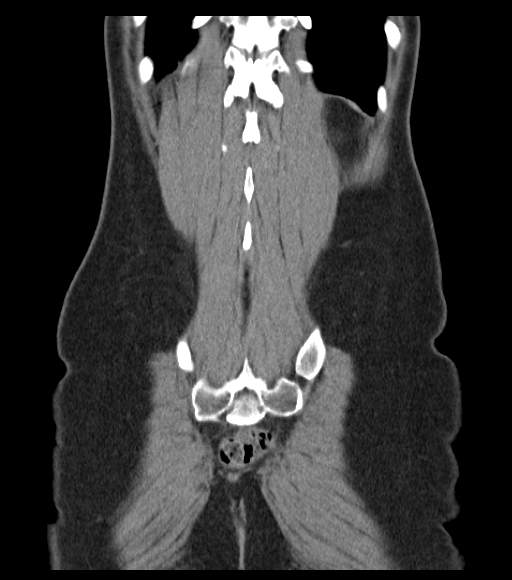

[Series 5: mpr sagittal (id) · sagittal · 0.56mm/px · 1 of 104 slices shown, 2 images]
[im 35/104  soft-tissue]
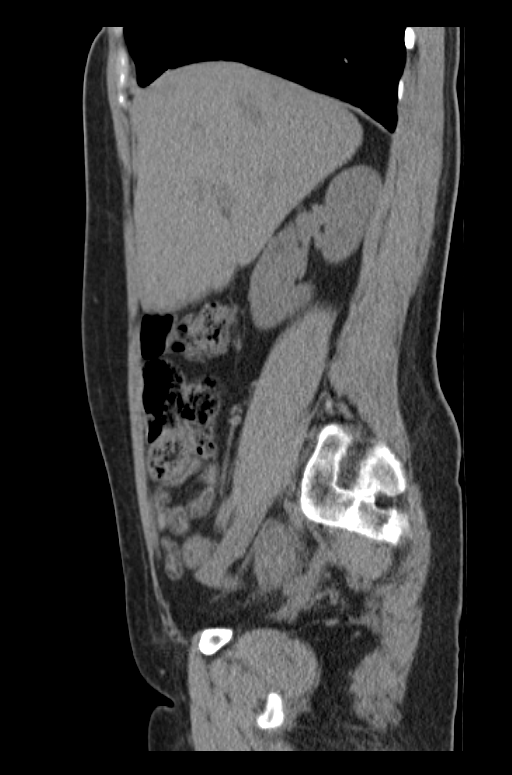
[im 35/104  bone]
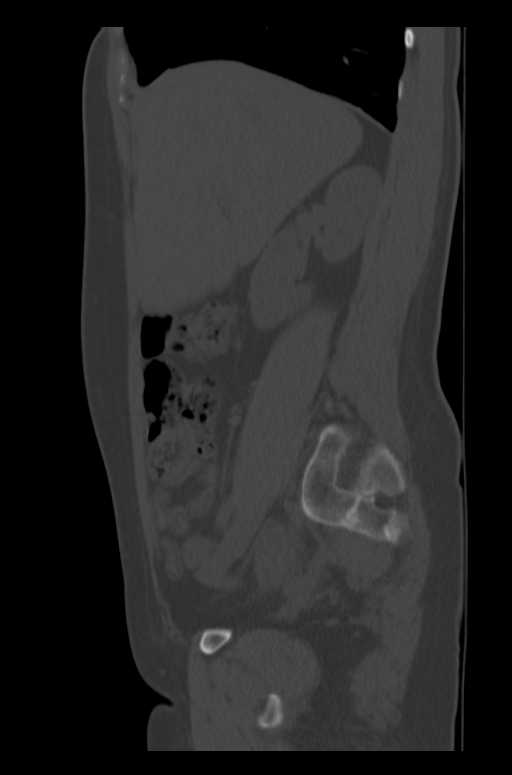

[9 of 46 positions shown; findings below may reference images not displayed]

FINDINGS: There is mild subsegmental atelectasis in the anterior left base and
posterior right base regions.

The liver is mildly enlarged measuring 16.7 cm in length. No focal
liver lesions are identified on this noncontrast enhanced study.
There is no biliary duct dilatation.

Spleen, pancreas, and adrenals appear normal.

There is a 1 x 1 cm cyst in the anterior mid right kidney. There is
a 1.6 x 1.5 cm cyst in the posterior lower pole right kidney. There
the are several tiny calculi in the right kidney, nonobstructing.
There is no hydronephrosis on the right. There is no ureteral
calculus or ureterectasis on the right.

On the left, there are several tiny calculi. There is a 1 x 1 cm
cyst in the mid left kidney. There is no appreciable hydronephrosis
on the left. There is no appreciable ureteral calculus or
ureterectasis on the left.

In the pelvis, there is no mass or fluid collection. Appendix
appears normal. There are multiple small phleboliths in the pelvis.

There is no bowel obstruction. No free air or portal venous air.

There is no ascites, adenopathy, or abscess in the abdomen or
pelvis.

Aorta is nonaneurysmal. There are no blastic or lytic bone lesions.
IMPRESSION: There are tiny calculi in each kidney. No hydronephrosis. No
ureteral calculus appreciated.

The liver is mildly enlarged without focal lesions seen on this
noncontrast enhanced study.

No mesenteric inflammation or bowel obstruction. No abscess.
Appendix appears normal.

## 2014-01-09 NOTE — Telephone Encounter (Signed)
Courtesy call made; message left.

## 2014-03-17 ENCOUNTER — Encounter (INDEPENDENT_AMBULATORY_CARE_PROVIDER_SITE_OTHER): Payer: Self-pay

## 2014-03-17 ENCOUNTER — Ambulatory Visit (INDEPENDENT_AMBULATORY_CARE_PROVIDER_SITE_OTHER): Payer: MEDICAID

## 2014-03-17 VITALS — BP 102/65 | HR 90 | Temp 98.4°F | Ht 63.0 in | Wt 128.2 lb

## 2014-03-17 DIAGNOSIS — J40 Bronchitis, not specified as acute or chronic: Principal | ICD-10-CM

## 2014-03-17 MED ORDER — AZITHROMYCIN 250 MG TABLET
ORAL_TABLET | ORAL | Status: DC
Start: 2014-03-17 — End: 2014-06-06

## 2014-03-17 MED ORDER — ALBUTEROL SULFATE HFA 90 MCG/ACTUATION AEROSOL INHALER
2.00 | INHALATION_SPRAY | RESPIRATORY_TRACT | Status: DC | PRN
Start: 2014-03-17 — End: 2014-06-06

## 2014-03-17 NOTE — Progress Notes (Signed)
BP 102/65 mmHg   Pulse 90   Temp(Src) 36.9 C (98.4 F) (Oral)   Ht 1.6 m (5\' 3" )   Wt 58.151 kg (128 lb 3.2 oz)   BMI 22.72 kg/m2   LMP 03/16/2014  Guerin Lashomb St. Eddie CandleAngelo, RTR  03/17/2014, 10:00

## 2014-03-17 NOTE — Progress Notes (Signed)
SUBJECTIVE:   Lindsay Mccullough is a 29 y.o. female who complains of chest congestion and productive cough for 1 week. She denies a history of chills and fevers. She admits to  a history of asthma. Patient denies smoke cigarettes.    Past Medical History  Current Outpatient Prescriptions   Medication Sig    albuterol sulfate (PROAIR HFA) 90 mcg/actuation Inhalation HFA Aerosol Inhaler Take 2 Puffs by inhalation Every 4 hours as needed    azithromycin (ZITHROMAX) 250 mg Oral Tablet 2 tablets day 1, then 1 tablet daily    hydrOXYzine pamoate (VISTARIL) 50 mg Oral Capsule Take 50 mg by mouth Three times a day as needed for Itching    lithium carbonate (ESKALITH) 300 mg Oral Capsule Take 300 mg by mouth Three times daily with meals     Allergies   Allergen Reactions    Amoxicillin Anaphylaxis    Penicillins Hives/ Urticaria     Past Medical History   Diagnosis Date    Asthma          Past Surgical History   Procedure Laterality Date    Hx no surgical procedures           Family History   Problem Relation Age of Onset    Congestive Heart Failure Mother     Hypertension Mother     High Cholesterol Mother     Cirrhosis Father     Hypertension Father     High Cholesterol Father          History     Social History    Marital Status: Divorced     Spouse Name: N/A     Number of Children: N/A    Years of Education: N/A     Social History Main Topics    Smoking status: Former Smoker    Smokeless tobacco: Not on file    Alcohol Use: Not on file    Drug Use: Not on file    Sexual Activity: Not on file     Other Topics Concern    Not on file     Social History Narrative        OBJECTIVE:  Vitals as noted by Nurse/MA above.  Appearance: in no apparent distress and well developed and well nourished.   ENT- ENT exam normal, no neck nodes or sinus tenderness.   Chest -+diffuse expiratory wheezing:  "Heart exam - S1, S2 normal, no murmur, no gallop, rate regular"}.    ASSESSMENT:   bronchitis    PLAN:  Antibiotics;  Proair; Call or return to clinic prn if these symptoms worsen or fail to improve as anticipated.

## 2014-03-17 NOTE — Patient Instructions (Signed)
Bronchitis  Bronchitis is inflammation of the airways that extend from the windpipe into the lungs (bronchi). The inflammation often causes mucus to develop, which leads to a cough. If the inflammation becomes severe, it may cause shortness of breath.  CAUSES   Bronchitis may be caused by:    Viral infections.    Bacteria.    Cigarette smoke.    Allergens, pollutants, and other irritants.   SIGNS AND SYMPTOMS   The most common symptom of bronchitis is a frequent cough that produces mucus. Other symptoms include:   Fever.    Body aches.    Chest congestion.    Chills.    Shortness of breath.    Sore throat.   DIAGNOSIS   Bronchitis is usually diagnosed through a medical history and physical exam. Tests, such as chest X-rays, are sometimes done to rule out other conditions.   TREATMENT   You may need to avoid contact with whatever caused the problem (smoking, for example). Medicines are sometimes needed. These may include:   Antibiotics. These may be prescribed if the condition is caused by bacteria.   Cough suppressants. These may be prescribed for relief of cough symptoms.    Inhaled medicines. These may be prescribed to help open your airways and make it easier for you to breathe.    Steroid medicines. These may be prescribed for those with recurrent (chronic) bronchitis.  HOME CARE INSTRUCTIONS   Get plenty of rest.    Drink enough fluids to keep your urine clear or pale yellow (unless you have a medical condition that requires fluid restriction). Increasing fluids may help thin your secretions and will prevent dehydration.    Only take over-the-counter or prescription medicines as directed by your health care provider.   Only take antibiotics as directed. Make sure you finish them even if you start to feel better.   Avoid secondhand smoke, irritating chemicals, and strong fumes. These will make bronchitis worse. If you are a smoker, quit smoking. Consider using nicotine gum or  skin patches to help control withdrawal symptoms. Quitting smoking will help your lungs heal faster.    Put a cool-mist humidifier in your bedroom at night to moisten the air. This may help loosen mucus. Change the water in the humidifier daily. You can also run the hot water in your shower and sit in the bathroom with the door closed for 5-10 minutes.    Follow up with your health care provider as directed.    Wash your hands frequently to avoid catching bronchitis again or spreading an infection to others.   SEEK MEDICAL CARE IF:  Your symptoms do not improve after 1 week of treatment.   SEEK IMMEDIATE MEDICAL CARE IF:   Your fever increases.   You have chills.    You have chest pain.    You have worsening shortness of breath.    You have bloody sputum.   You faint.   You have lightheadedness.   You have a severe headache.    You vomit repeatedly.  MAKE SURE YOU:    Understand these instructions.   Will watch your condition.   Will get help right away if you are not doing well or get worse.  Document Released: 02/16/2005 Document Revised: 12/07/2012 Document Reviewed: 10/11/2012  ExitCare Patient Information 2015 [employer]. This information is not intended to replace advice given to you by your health care provider. Make sure you discuss any questions you have with your health care   provider.

## 2014-03-31 IMAGING — CR DG ANKLE COMPLETE 3+V*R*
3 series · 3 of 3 positions shown · non-contrast
Comparison: 10/29/2012

CLINICAL DATA: Ankle pain after fall

EXAM:
RIGHT ANKLE - COMPLETE 3+ VIEW

[view not recorded (1 of 3)]
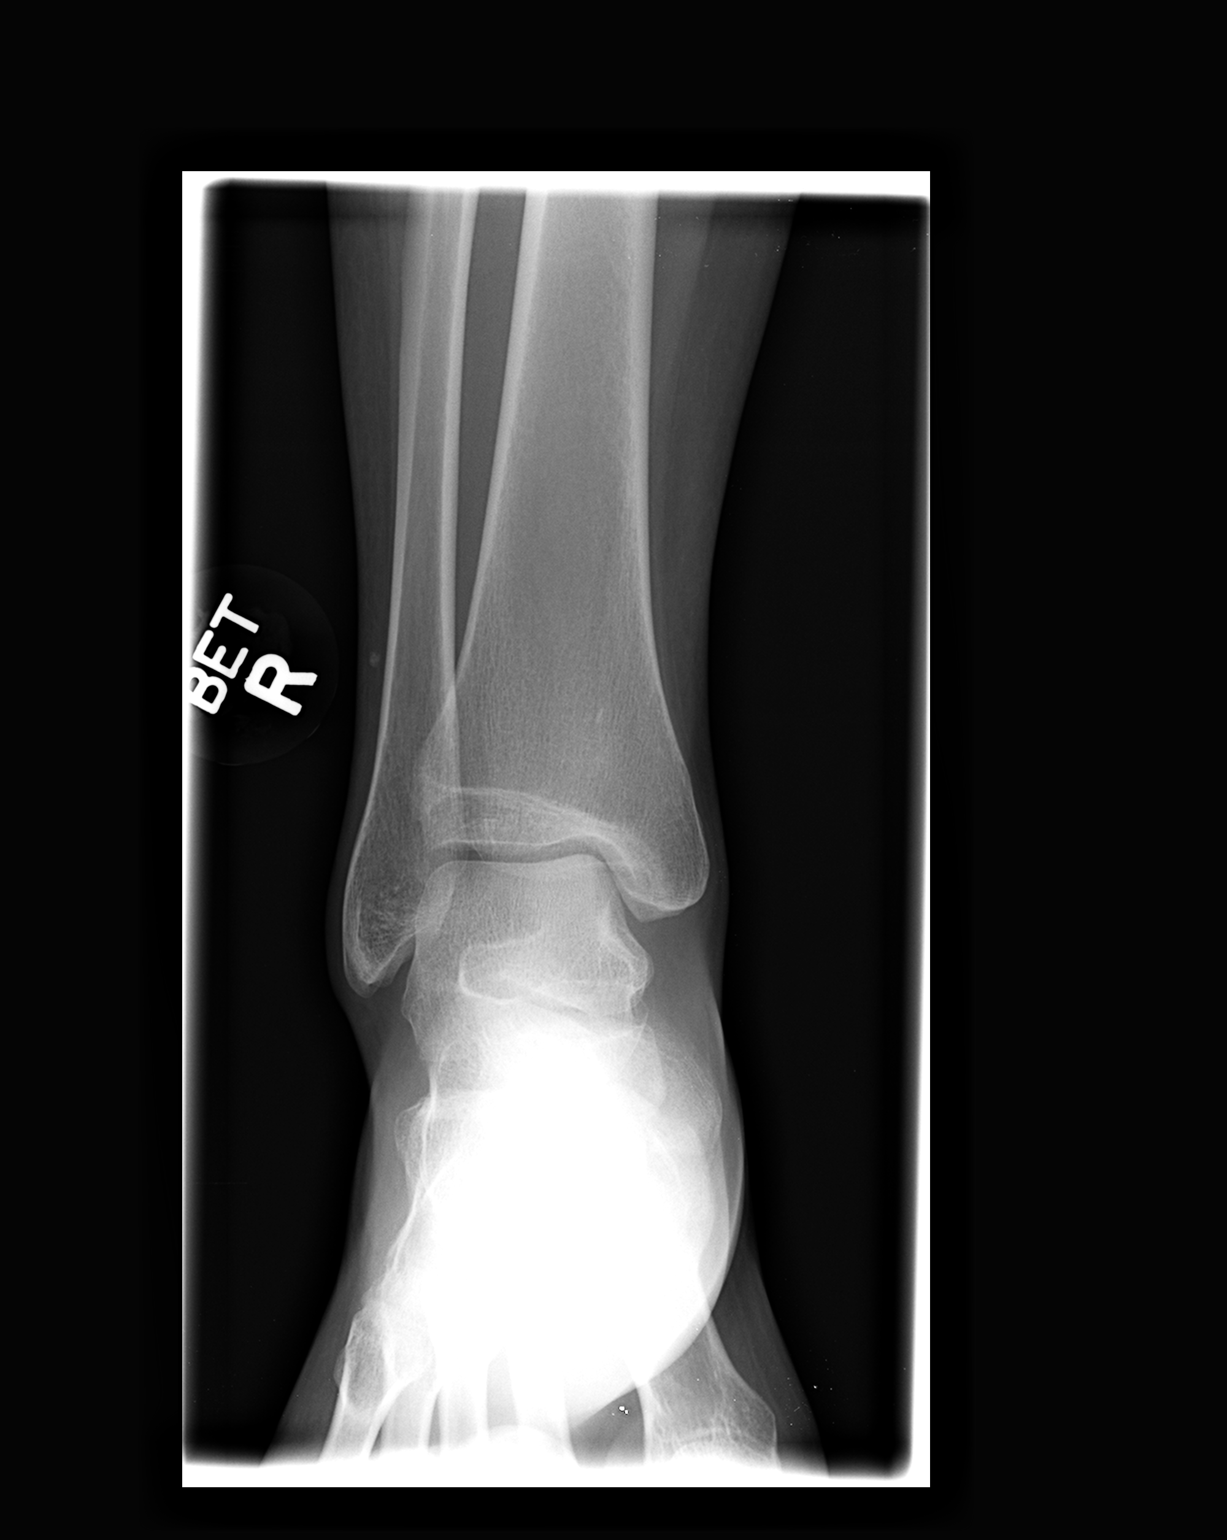

[view not recorded (2 of 3)]
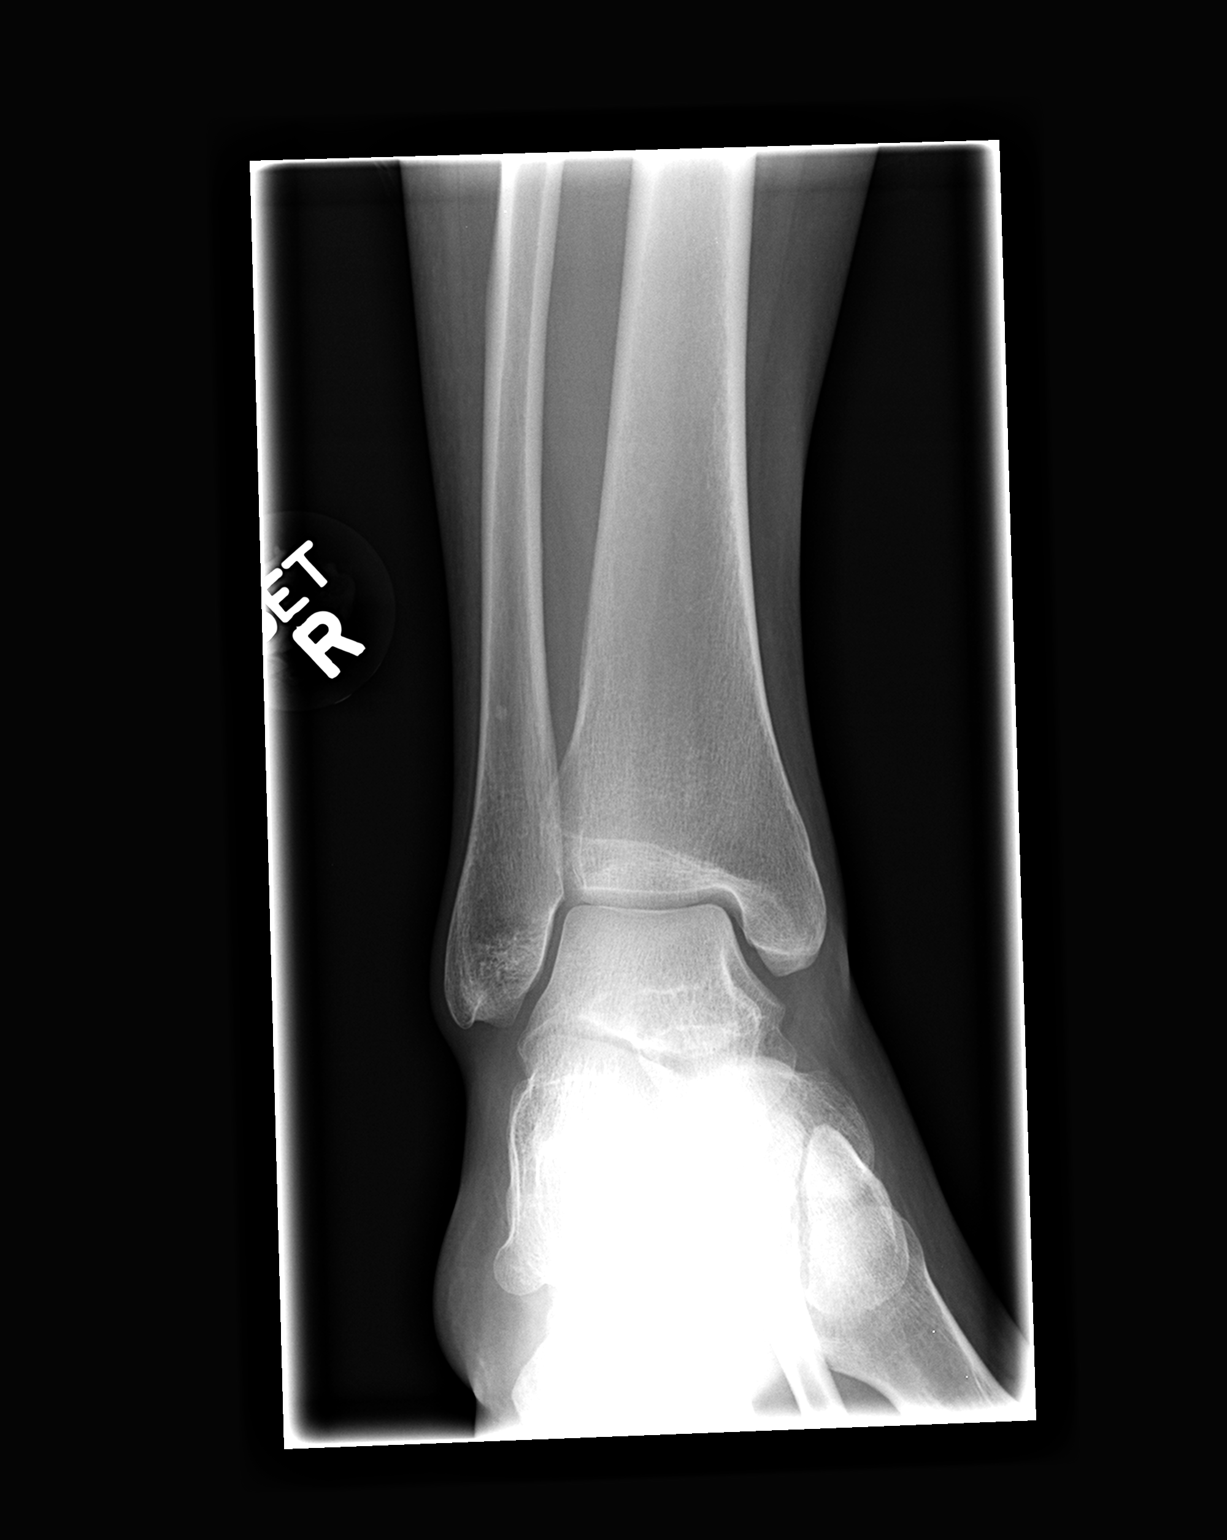

[view not recorded (3 of 3)]
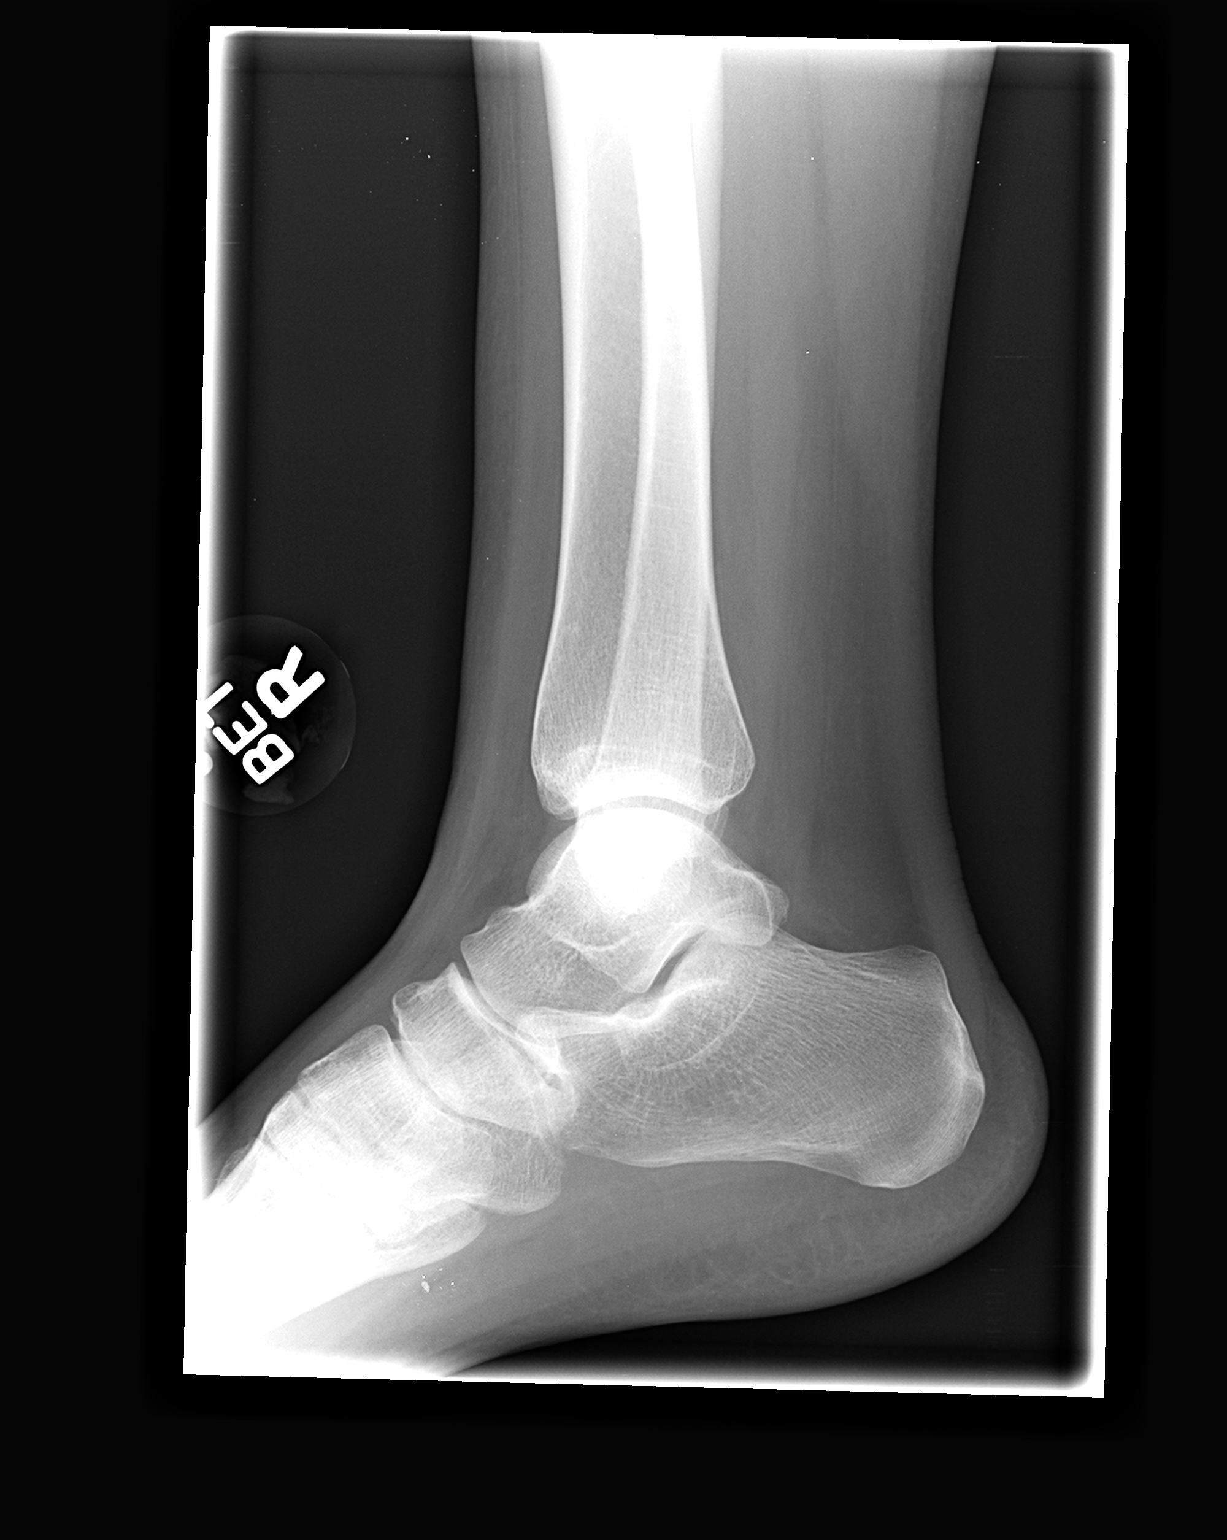

[3 of 3 positions shown; findings below may reference images not displayed]

FINDINGS: There is no evidence of fracture, dislocation, or joint effusion.
There is no evidence of arthropathy or other focal bone abnormality.
IMPRESSION: Negative.

## 2014-04-19 ENCOUNTER — Ambulatory Visit (INDEPENDENT_AMBULATORY_CARE_PROVIDER_SITE_OTHER): Payer: Medicaid (Managed Care) | Admitting: Medical

## 2014-04-19 ENCOUNTER — Encounter (INDEPENDENT_AMBULATORY_CARE_PROVIDER_SITE_OTHER): Payer: Self-pay

## 2014-04-19 VITALS — BP 100/68 | HR 77 | Temp 98.0°F | Resp 16 | Ht 63.0 in | Wt 129.6 lb

## 2014-04-19 DIAGNOSIS — M545 Low back pain: Secondary | ICD-10-CM

## 2014-04-19 DIAGNOSIS — H9201 Otalgia, right ear: Secondary | ICD-10-CM

## 2014-04-19 NOTE — Patient Instructions (Addendum)
External Ear Infection (Adult)    External otitis (also called "swimmer's ear")is an infection in the ear canal. It is often caused by bacteria or fungus. It can occur a few days after water gets trapped in the ear canal (fromswimming or bathing). It can also occur after cleaning too deeply in the ear canal with a cotton swab or other object. Sometimes, hair care products get into the ear canal and cause this problem.  Symptoms can include pain,fever,itching, redness, drainage, or swelling of the ear canal. Temporary hearing loss may also occur.  Home care   Do not try to clean the ear canal. This can push pus and bacteria deeper into the canal.   Use prescribed ear drops as directed. These help reduce swelling and fight the infection. If an ear wick was placed in the ear canal, apply drops right onto the end of the wick. The wick will draw the medication into the ear canal even if it is swollen closed.   A cotton ball may be loosely placed in the outer ear to absorb any drainage.   You may use acetaminophen or ibuprofen to control pain, unless another medication was prescribed. Note: If you have chronic liver or kidney disease or ever had a stomach ulcer or GI bleeding, talk to your health care provider before taking any of these medications.   Do not allow water to get into your ear when bathing. Also, avoid swimming until the infection has cleared.  Prevention   Keep your ears dry. This helps lower the risk of infection. Dry your ears with a towel or hair dryer after getting wet. Also, use ear plugs when swimming.   Do not stick any objects in the ear to remove wax.   If you feel water trapped in your ear, use ear drops right away. You can get these drops over the counter at most drugstores. They work by removing water from the ear canal.  Follow-up care  Follow up with your health care provider in one week, or as advised.  When to seek medical advice  Call your health care provider right away if  any of these occur:   Ear pain becomes worse or doesn't improve after 3 days of treatment   Redness or swelling of the outer ear occurs or gets worse   Headache   Painful or stiff neck   Drowsiness or confusion   Fever of 100.23F (38C) or higher, or as directed by your health care provider   Seizure   2000-2015 [employer]. [address] [place], [place] [address]. All rights reserved. This information is not intended as a substitute for professional medical care. Always follow your healthcare professional's instructions.        Back Pain [Acute Or Chronic]    Back pain is usually caused by an injury to the muscles or ligaments of the spine. Sometimes the disks that separate each bone in the spine may bulge and cause pain by pressing on a nearby nerve. Back pain may also appear after a sudden twisting/bending force (such as in a car accident), after a simple awkward movement, or lifting something heavy with poor body positioning. In either case, muscle spasm is often present and adds to the pain.  Acute back pain usually gets better in one to two weeks. Back pain related to disk disease, arthritis in the spinal joints or spinal stenosis (narrowing of the spinal canal) can become chronic and last for months or years.  Unless you had a physical injury (for example, a car accident or fall) X-rays are usually not ordered for the initial evaluation of back pain. If pain continues and does not respond to medical treatment, x-rays and other tests may be performed at a later time.  Home Care:  You may need to stay in bed the first few days. But, as soon as possible, begin sitting or walking to avoid problems with prolonged bed rest (muscle weakness, worsening back stiffness and pain, blood clots in the legs).  When in bed, try to find a position of comfort. A firm mattress is best. Try lying flat on your back with pillows under your knees. You can also try lying on your side with your knees bent up  towards your chest and a pillow between your knees.  Avoid prolonged sitting. This puts more stress on the lower back than standing or walking.  During the first two days after injury, apply an ICE PACK to the painful area for 20 minutes every 2-4 hours. This will reduce swelling and pain. HEAT (hot shower, hot bath or heating pad) works well for muscle spasm. You can start with ice, then switch to heat after two days. Some patients feel best alternating ice and heat treatments. Use the one method that feels the best to you.  You may use acetaminophen (Tylenol) or ibuprofen (Motrin, Advil) to control pain, unless another pain medicine was prescribed. [NOTE: If you have chronic liver or kidney disease or ever had a stomach ulcer or GI bleeding, talk with your doctor before using these medicines.]  Be aware of safe lifting methods and do not lift anything over 15 pounds until all the pain is gone.  Follow Up  with your doctor or this facility if your symptoms do not start to improve after one week. Physical therapy may be needed.  [NOTE: If X-rays were taken, they will be reviewed by a radiologist. You will be notified of any new findings that may affect your care.]  Get Prompt Medical Attention  if any of the following occur:  Pain becomes worse or spreads to your legs  Weakness or numbness in one or both legs  Loss of bowel or bladder control  Numbness in the groin or genital area   2000-2015 [employer]. [address], [place], [place] [address]. All rights reserved. This information is not intended as a substitute for professional medical care. Always follow your healthcare professional's instructions.
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
## 2014-05-18 ENCOUNTER — Encounter (INDEPENDENT_AMBULATORY_CARE_PROVIDER_SITE_OTHER): Payer: Self-pay

## 2014-05-18 ENCOUNTER — Ambulatory Visit (INDEPENDENT_AMBULATORY_CARE_PROVIDER_SITE_OTHER): Payer: Medicaid (Managed Care)

## 2014-05-18 VITALS — BP 108/61 | HR 78 | Temp 98.3°F | Resp 20 | Ht 63.0 in | Wt 130.0 lb

## 2014-05-18 DIAGNOSIS — D649 Anemia, unspecified: Secondary | ICD-10-CM | POA: Insufficient documentation

## 2014-05-18 DIAGNOSIS — F32A Depression, unspecified: Secondary | ICD-10-CM

## 2014-05-18 DIAGNOSIS — F329 Major depressive disorder, single episode, unspecified: Secondary | ICD-10-CM

## 2014-05-18 DIAGNOSIS — J020 Streptococcal pharyngitis: Secondary | ICD-10-CM

## 2014-05-18 DIAGNOSIS — J4520 Mild intermittent asthma, uncomplicated: Secondary | ICD-10-CM

## 2014-05-18 DIAGNOSIS — J45909 Unspecified asthma, uncomplicated: Secondary | ICD-10-CM | POA: Insufficient documentation

## 2014-05-18 DIAGNOSIS — R6889 Other general symptoms and signs: Secondary | ICD-10-CM

## 2014-05-18 DIAGNOSIS — F419 Anxiety disorder, unspecified: Secondary | ICD-10-CM

## 2014-05-18 LAB — POCT RAPID STREP A: Rapid Strep A Screen POCT: POSITIVE — AB

## 2014-05-18 LAB — POCT INFLUENZA A/B
POCT Rapid Influenza A AG: NEGATIVE
POCT Rapid Influenza B AG: NEGATIVE

## 2014-05-18 MED ORDER — ALBUTEROL SULFATE (2.5 MG/3ML) 0.083% IN NEBU
2.5000 mg | INHALATION_SOLUTION | Freq: Once | RESPIRATORY_TRACT | Status: AC
Start: 2014-05-18 — End: 2014-05-18
  Administered 2014-05-18: 2.5 mg via RESPIRATORY_TRACT

## 2014-05-18 MED ORDER — ALBUTEROL SULFATE HFA 108 (90 BASE) MCG/ACT IN AERS
1.0000 | INHALATION_SPRAY | RESPIRATORY_TRACT | Status: DC | PRN
Start: 2014-05-18 — End: 2015-04-10

## 2014-05-18 MED ORDER — IBUPROFEN 200 MG PO TABS
200.0000 mg | ORAL_TABLET | Freq: Once | ORAL | Status: AC
Start: 2014-05-18 — End: 2014-05-18
  Administered 2014-05-18: 200 mg via ORAL

## 2014-05-18 MED ORDER — ACETAMINOPHEN 325 MG PO TABS
650.0000 mg | ORAL_TABLET | Freq: Once | ORAL | Status: AC
Start: 2014-05-18 — End: 2014-05-18
  Administered 2014-05-18: 650 mg via ORAL

## 2014-05-18 MED ORDER — AZITHROMYCIN 250 MG PO TABS
250.0000 mg | ORAL_TABLET | Freq: Every day | ORAL | Status: AC
Start: 2014-05-18 — End: 2014-05-24

## 2014-05-18 NOTE — Progress Notes (Signed)
albuterol Neb treatment   Start Time: 13:35pm by Dr. Johney Maine  Stop Time: 13:50pm by Archie Endo, LPN     - MAR ACTION REPORT  (last 24 hrs)         Juanito Doom, LPN       Medication Name Action Time Action Site Route Rate Dose Reason Comments User     acetaminophen (TYLENOL) tablet 650 mg 05/18/14 1349 Given  Oral  650 mg   Archie Endo N, LPN     albuterol (PROVENTIL) nebulizer solution 2.5 mg 05/18/14 1350 Given  Nebulization  2.5 mg   Juanito Doom, LPN     ibuprofen (ADVIL,MOTRIN) tablet 200 mg 05/18/14 1349 Given  Oral  200 mg   Juanito Doom, LPN              Above medications administered by Dr. Norberta Keens, MD

## 2014-05-18 NOTE — Progress Notes (Signed)
Subjective:    Patient ID: Regina Adams is a 29 y.o. female.    URI   This is a new problem. Episode onset: x2 days, cough, headache, fever to 100.1, myalgias. The problem has been unchanged. The maximum temperature recorded prior to her arrival was 100.4 - 100.9 F. Associated symptoms include chest pain (with coughing or breathing), congestion, coughing (productive of yellow/green sputum possibly from throat) and headaches. Pertinent negatives include no abdominal pain, diarrhea, sore throat or vomiting. Treatments tried: ibuprofen 400mg  q6hrs. The treatment provided mild relief.     Sick contacts: nieces and nephews    The following portions of the patient's history were reviewed and updated as appropriate: allergies, current medications, past family history, past medical history, past social history, past surgical history and problem list.    Review of Systems   HENT: Positive for congestion. Negative for sore throat.    Respiratory: Positive for cough (productive of yellow/green sputum possibly from throat).    Cardiovascular: Positive for chest pain (with coughing or breathing).   Gastrointestinal: Negative for vomiting, abdominal pain and diarrhea.   Neurological: Positive for headaches.   All other systems reviewed and are negative.        Objective:    BP 108/61 mmHg  Pulse 78  Temp(Src) 98.3 F (36.8 C) (Oral)  Resp 20  Ht 1.6 m (5\' 3" )  Wt 58.968 kg (130 lb)  BMI 23.03 kg/m2  SpO2 98%  LMP 04/18/2014    Physical Exam   Constitutional: She is oriented to person, place, and time. She appears well-developed and well-nourished.   HENT:   Head: Normocephalic.   Right Ear: Tympanic membrane normal.   Left Ear: Tympanic membrane normal.   Nose: Rhinorrhea present. Right sinus exhibits no maxillary sinus tenderness and no frontal sinus tenderness. Left sinus exhibits no maxillary sinus tenderness and no frontal sinus tenderness.   Mouth/Throat: Mucous membranes are normal. Posterior oropharyngeal  erythema present. No oropharyngeal exudate or posterior oropharyngeal edema.   Eyes: Conjunctivae are normal.   Cardiovascular: Normal rate, regular rhythm and normal heart sounds.  Exam reveals no friction rub.    No murmur heard.  Pulmonary/Chest: Effort normal and breath sounds normal. No respiratory distress. She has no wheezes. She has no rales.   Lymphadenopathy:     She has cervical adenopathy.   Neurological: She is alert and oriented to person, place, and time.   Skin: Skin is warm and dry.   Psychiatric: She has a normal mood and affect.   Nursing note and vitals reviewed.    Lab Results from today's visit:  Recent Results (from the past 12 hour(s))   POCT INFLUENZA A/B    Collection Time: 05/18/14  1:42 PM   Result Value Ref Range    POCT QC Pass     POCT Rapid Influenza A AG Negative Negative    POCT Rapid Influenza B AG Negative Negative   Strep - positive    Radiology Results from today's visit:  No results found.    Re-assessment at 1:52 PM: Pt reports feeling much better after albuterol/tylenol/advil. Her breathing feels back to normal.      Assessment and Plan:       Regina Adams was seen today for uri.    Diagnoses and all orders for this visit:    Streptococcal sore throat  Orders:  -     azithromycin (ZITHROMAX Z-PAK) 250 MG tablet; Take 1 tablet (250 mg total) by mouth daily. Take  2 tablets on Day 1, then one tab daily on Days 2-5.    Flu-like symptoms  Orders:  -     POCT INFLUENZA A/B  -     albuterol (PROVENTIL HFA;VENTOLIN HFA) 108 (90 BASE) MCG/ACT inhaler; Inhale 1-2 puffs into the lungs every 4 (four) hours as needed for Wheezing or Shortness of Breath.  -     albuterol (PROVENTIL) nebulizer solution 2.5 mg; Take 3 mLs (2.5 mg total) by nebulization one time.    -     Pulse Oximetry; Standing  -     Pulse Oximetry  -     acetaminophen (TYLENOL) tablet 650 mg; Take 2 tablets (650 mg total) by mouth once.    -     ibuprofen (ADVIL,MOTRIN) tablet 200 mg; Take 1 tablet (200 mg total) by mouth  once.    -     POCT RAPID STREP A            Corliss Parish, MD  [facility]  05/18/2014  1:52 PM

## 2014-05-18 NOTE — Patient Instructions (Signed)
You are contagious for 24 hours.   You should start feeling better after 2 days of the antibiotics.  Get a new toothbrush 3 days after starting medication.  Drink lots of fluids.  Call if symptoms are worsening or no improvement after 2 days of antibiotics.    Try drinking warm water or tea with 1tbsp honey and 1tbsp lemon juice every 3 hours as needed to soothe your sore throat and decrease cough.    Go to the ER for any new or worsening symptoms that concern you.  Follow-up with your primary care doctor if your symptoms do not improve.          Pharyngitis: Strep [Confirmed]    Your test for strep throat was positive. Strep throat is a contagious illness. It is spread by coughing, kissing or by touching others after touching your mouth or nose. Symptoms include throat pain which is worse with swallowing, aching all over, headache and fever. You will be treated with an antibiotic which should make you start to feel better within 1-2 days.  Home Care:   Rest at home and drink plenty of fluids to avoid dehydration.   No school or work for the first two days on antibiotics. You will not be contagious after this time and if you are feeling better, you can return to school or work.   Take your antibiotics for a full 10 days, even if you feel better after the first few days of treatment. This is very important to prevent heart or kidney disease that can result as a complication of untreated strep throat infection.   Children: Use acetaminophen (Tylenol) for fever, fussiness or discomfort. In infants over six months of age, you may use ibuprofen (Children's Motrin) instead of Tylenol. [NOTE: If your child has chronic liver or kidney disease or ever had a stomach ulcer or GI bleeding, talk with your doctor before using these medicines.] (Aspirin should never be used in anyone under 18 years of age who is ill with a fever. It may cause severe liver damage.)Adults:  You may use acetaminophen (Tylenol) or ibuprofen (Motrin, Advil) to control pain or fever, unless another medicine was prescribed for this. [NOTE: If you have chronic liver or kidney disease or ever had a stomach ulcer or GI bleeding, talk with your doctor before using these medicines.]   Throat lozenges or sprays (Chloraseptic and others) will reduce pain. Gargling with warm salt water will also reduce throat pain. Dissolve 1/2 teaspoon of salt in 1 glass of warm water. This is especially useful just before meals.  Follow Up  with your doctor or as directed by our staff if you are not improving over the next week.  Get Prompt Medical Attention  if any of the following occur:   Fever of 100.4F (38C) oral or higher, not better with fever medication   New or worsening ear pain, sinus pain or headache   Painful lumps in the back of your neck   Unable to swallow liquids or open your mouth wide due to throat pain   Trouble breathing or noisy breathing   Muffled voice   New rash   2000-2014 [employer], [address], [place] [address]. All rights reserved. This information is not intended as a substitute for professional medical care. Always follow your healthcare professional's instructions.

## 2014-05-21 ENCOUNTER — Telehealth (INDEPENDENT_AMBULATORY_CARE_PROVIDER_SITE_OTHER): Payer: Self-pay

## 2014-05-21 NOTE — Telephone Encounter (Signed)
Courtesy call made spoke with pt she states " im feeling a lot better" no concerns at this time

## 2014-06-06 ENCOUNTER — Ambulatory Visit (INDEPENDENT_AMBULATORY_CARE_PROVIDER_SITE_OTHER): Payer: MEDICAID

## 2014-06-06 ENCOUNTER — Encounter (INDEPENDENT_AMBULATORY_CARE_PROVIDER_SITE_OTHER): Payer: Self-pay

## 2014-06-06 VITALS — BP 103/62 | HR 80 | Temp 98.5°F | Resp 16 | Wt 132.0 lb

## 2014-06-06 DIAGNOSIS — J329 Chronic sinusitis, unspecified: Secondary | ICD-10-CM

## 2014-06-06 MED ORDER — GUAIFENESIN ER 600 MG TABLET,EXTENDED RELEASE
600.00 mg | ORAL_TABLET | Freq: Two times a day (BID) | ORAL | Status: DC | PRN
Start: 2014-06-06 — End: 2014-08-07

## 2014-06-06 MED ORDER — FLUTICASONE PROPIONATE 50 MCG/ACTUATION NASAL SPRAY,SUSPENSION
1.0000 | Freq: Every day | NASAL | Status: DC
Start: 2014-06-06 — End: 2014-08-07

## 2014-06-06 MED ORDER — DOXYCYCLINE HYCLATE 100 MG CAPSULE
100.00 mg | ORAL_CAPSULE | Freq: Two times a day (BID) | ORAL | Status: AC
Start: 2014-06-06 — End: 2014-06-16

## 2014-06-06 NOTE — Patient Instructions (Signed)

## 2014-06-06 NOTE — Progress Notes (Signed)
BP 103/62 mmHg   Pulse 80   Temp(Src) 36.9 C (98.5 F) (Oral)   Resp 16   Wt 59.875 kg (132 lb)   SpO2 98%  Janace Littenracy L Kamsiyochukwu Buist, RN  06/06/2014, 08:23

## 2014-06-06 NOTE — Progress Notes (Signed)
SUBJECTIVE:   Lindsay PeaksJessica Mccullough is a 29 y.o. female who complains of sinus and nasal congestion, sore throat, nasal blockage, post nasal drip and productive cough for 7 days. She denies a history of fevers, myalgias, wheezing and shortness of breath and admits to  a history of asthma.  She was recently treated fo r strep with Z pak.    ROS: no fever, no headaches, no wheezing or shortness of breath, no weakness or difficulty with ambulation, no skin rash or lesion    History   Substance Use Topics    Smoking status: Former Smoker    Smokeless tobacco: Not on file    Alcohol Use: Not on file     Family History   Problem Relation Age of Onset    Congestive Heart Failure Mother     Hypertension Mother     High Cholesterol Mother     Cirrhosis Father     Hypertension Father     High Cholesterol Father          Past Medical History   Diagnosis Date    Asthma          There is no problem list on file for this patient.      Outpatient Prescriptions Prior to Visit:  lithium carbonate (ESKALITH) 300 mg Oral Capsule Take 300 mg by mouth Three times daily with meals   albuterol sulfate (PROAIR HFA) 90 mcg/actuation Inhalation HFA Aerosol Inhaler Take 2 Puffs by inhalation Every 4 hours as needed   azithromycin (ZITHROMAX) 250 mg Oral Tablet 2 tablets day 1, then 1 tablet daily   hydrOXYzine pamoate (VISTARIL) 50 mg Oral Capsule Take 50 mg by mouth Three times a day as needed for Itching     No facility-administered medications prior to visit.     OBJECTIVE:  BP 103/62 mmHg   Pulse 80   Temp(Src) 36.9 C (98.5 F) (Oral)   Resp 16   Wt 59.875 kg (132 lb)   SpO2 98%  She appears well, vital signs are as noted.   Eyes non icteric, non injected  Ears: normal TM's and external ear canals AU.    Throat and pharynx normal; maxillary and frontal sinuses TTP b/l  The chest  is clear, without wheezes or rales   Heart: regular rate and rhythm  Skin: Warm and dry, no rash or lesion  Ext: no edema      ASSESSMENT:     ICD-10-CM    1.  Sinusitis J32.9        PLAN: Antibiotics, Mucinex  \Symptomatic therapy suggested: push fluids, rest, use vaporizer or mist prn and apply heat to sinuses prn. Pt verbalized understanding and agrees with the plan of care. Advised pt to call or return to clinic prn if these symptoms worsen or fail to improve as anticipated.    Lindsay PourMenna Arlen Legendre, MD 06/06/2014 08:57

## 2014-06-07 ENCOUNTER — Encounter (INDEPENDENT_AMBULATORY_CARE_PROVIDER_SITE_OTHER): Payer: Self-pay

## 2014-06-07 NOTE — Progress Notes (Signed)
Pt seen in urgent care yesterday. Called today to follow up, pt states she is feeling about the same. Patient states that she was able to pick up the prescribed medications. No questions or concerns at this time. Informed pt to call back if there are any questions or concerns.   Lindsay Mccullough, RTR  06/07/2014, 17:31

## 2014-08-07 ENCOUNTER — Emergency Department (HOSPITAL_BASED_OUTPATIENT_CLINIC_OR_DEPARTMENT_OTHER)
Admission: EM | Admit: 2014-08-07 | Discharge: 2014-08-07 | Disposition: A | Discharge status: Home / Self Care | Payer: MEDICAID | Attending: Emergency Medicine | Admitting: Emergency Medicine

## 2014-08-07 ENCOUNTER — Encounter (HOSPITAL_BASED_OUTPATIENT_CLINIC_OR_DEPARTMENT_OTHER): Payer: Self-pay

## 2014-08-07 DIAGNOSIS — L259 Unspecified contact dermatitis, unspecified cause: Secondary | ICD-10-CM | POA: Insufficient documentation

## 2014-08-07 DIAGNOSIS — L237 Allergic contact dermatitis due to plants, except food: Secondary | ICD-10-CM | POA: Insufficient documentation

## 2014-08-07 DIAGNOSIS — H6590 Unspecified nonsuppurative otitis media, unspecified ear: Principal | ICD-10-CM | POA: Insufficient documentation

## 2014-08-07 MED ORDER — CETIRIZINE 10 MG TABLET
10.0000 mg | ORAL_TABLET | Freq: Every day | ORAL | Status: AC
Start: 2014-08-07 — End: 2014-08-21

## 2014-08-07 MED ORDER — FLUTICASONE PROPIONATE 50 MCG/ACTUATION NASAL SPRAY,SUSPENSION
2.0000 | Freq: Every day | NASAL | Status: AC
Start: 2014-08-07 — End: 2014-08-21

## 2014-08-07 NOTE — ED Nurses Note (Addendum)
Pt states was dx with left ear infection 3 weeks ago. Has been seen 3 times since then for on going pain. States on 3 diff abx without relief of infection. Currently taking bactrim and ceftin. States still feeling a lot of pressure behind left ear drum.

## 2014-08-07 NOTE — ED Provider Notes (Addendum)
Caprice Red, MD  [facility]  Emergency Department Visit Note    Date:  08/07/2014  Primary care provider:  None Given  Means of arrival:  private car  History obtained from: patient  History limited by: none    Chief Complaint:  Left ear pain    HISTORY OF PRESENT ILLNESS     Lindsay Mccullough, date of birth April 15, 1985, is a 29 y.o. female who presents to the Emergency Department complaining of a left ear pain.    The patient reports that she was experiencing sinus pressure, fluid behind the left ear, rhinorrhea, watery eyes, headache, and cough approximately three weeks ago. The patient reports that she continued to experience fluid moving in the left ear once the other symptoms had resolved and it became painful and annoying. The patient was evaluated at [facility] and was prescribed Bactrim PO. The patient took the medication for one day without relief and her left ear was now becoming more painful. The patient states that the pharmacist told her "it was a deadly pill" and she was concerned due to the side effects. One week ago, the patient returned to [facility]. The patient was told that she now had an ear infection and was prescribed Ceftin. The patient took Ceftin for the past seven days without relief. The patient started using ear drops (Oflaxicin) from an old prescription one week ago as well which also has not helped. She states that the ear drops are not staying in the ear and are running out.     Currently, the patient continues to report left ear pain and complete relief of sinus symptoms, cough, and headache. The patient states that she cannot hear out of the left ear and there is minimal tinnitus. The patient reports a 7/10 pain severity. The patient has also used Tylenol PO, Motrin PO, Claritin PO, and Benadryl PO OTC at home the past few days without relief. The patient is scheduled to see a ENT specialist on 08/22/2014.    Associated Symptoms:   Positive:  Tinnitus and hearing loss on  the left  Negative:  Dental pain, known fever, current cough, shortness of breath, chest pain, vomiting, rash, dysuria, current sinus pain and rhinorrhea and headache    REVIEW OF SYSTEMS     The pertinent positive and negative symptoms are as per HPI. All other systems reviewed and are negative.     PATIENT HISTORY     Past Medical History:  Past Medical History   Diagnosis Date    Asthma      Past Surgical History:  Past Surgical History   Procedure Laterality Date    Hx no surgical procedures       Family History:  Family History   Problem Relation Age of Onset    Congestive Heart Failure Mother     Hypertension Mother     High Cholesterol Mother     Cirrhosis Father     Hypertension Father     High Cholesterol Father      Social History:  History   Substance Use Topics    Smoking status: Former Smoker    Smokeless tobacco: Not on file    Alcohol Use: Not on file     History   Drug Use Not on file     Medications:  Previous Medications    CEFUROXIME (CEFTIN) 500 MG ORAL TABLET    Take 500 mg by mouth Twice daily    OFLOXACIN (FLOXIN) 0.3 %  OTIC DROPS    Instill 10 Drops into both ears Once a day    TRIMETHOPRIM-SULFAMETHOXAZOLE (BACTRIM DS) 800-160 MG ORAL TABLET    Take 1 Tab by mouth Every 12 hours     Allergies:  Allergies   Allergen Reactions    Amoxicillin Anaphylaxis    Penicillins Hives/ Urticaria     PHYSICAL EXAM     Vitals:  Filed Vitals:    08/07/14 1559   BP: 110/75   Pulse: 85   Temp: 36.8 C (98.2 F)   Resp: 16   SpO2: 100%       Pulse ox  100% on None (Room Air) interpreted by me as: Normal    Constitutional: well developed and well nourished young female in NAD.  Head: Normocephalic and atraumatic.   ENT: Moist mucous membranes. No erythema or exudates in the oropharynx.White scar tissue on both sides of the septum. Right TM and canal are normal. Left canal is normal. Left TM has clear fluid behind it, there is no erythema, no bulging, and the body landmarks appear normal. Edentulous  mouth.  Eyes: EOM are normal. Pupils are equal, round, and reactive to light. No scleral icterus.   Neck: Neck supple. No meningismus.  Cardiovascular: Normal rate and regular rhythm. Normal S1 and S2. Exam reveals no gallop and no friction rub.  No murmur heard.  Pulmonary/Chest: Effort normal and breath sounds normal.   Abdominal: Soft. No distension. There is no tenderness. No organomegaly. Normal bowel sounds present.  Back: There is no CVA tenderness.  Musculoskeletal: Normal range of motion. No edema and no extremity tenderness. No clubbing or cyanosis.  Lymphadenopathy: One enlarged posterior cervical lymph node on the right.  Neurological: Patient is alert and oriented to person, place, and time. Strength and sensation normal in all extremities. Cranial nerves II - XII intact. Normal speech.  Skin: Scattered linear areas of dry, papular rash with no active weeping (poison oak per patient). Skin is warm and dry.    ED PROGRESS NOTE / MEDICAL DECISION MAKING     Old records reviewed by me:  I have reviewed the patient's recent past medical history. Nurse's notes reviewed.    4:21 PM - Initial evaluation of the patient complete. I explained that I will be consulting Dr. Johnette Abraham (ENT) at this time. The patient understands and is in agreement. Dr. Johnette Abraham (ENT) paged.    4:53 PM - I discussed the patient's case and above findings with the ENT resident who recommends that the patient try Flonase, twice per day, continue an antihistamine, and try to "pop" her ears frequently. The patient should call Dr. Candelaria Celeste (ENT) on Monday (08/13/2014) if her symptoms have not relieved in order to move up her appointment to discuss having a tube placed in her ear to drain the fluid.    5:08 PM - On recheck, I explained my consult with the ENT resident above. I noted that I will prescribe the patient Flonase nasal spray and Zyrtec PO to assist with her symptoms throughout the next week. I advised the patient to "pop" her ears to  relieve the fluid behind her ear. The patient should discontinue all antibiotic medication previously prescribed as there is no evidence of infection at this time.  All questions were answered to the patient's satisfaction. The patient understands the diagnosis, discharge, and follow up instructions. The patient is stable for discharge home.     Pre-Disposition Vitals:  Filed Vitals:    08/07/14 1559  BP: 110/75   Pulse: 85   Temp: 36.8 C (98.2 F)   Resp: 16   SpO2: 100%     CLINICAL IMPRESSION     Encounter Diagnoses   Name Primary?    Serous otitis media Yes    Contact dermatitis due to poison oak      DISPOSITION/PLAN     Discharged        Prescriptions:     New Prescriptions    CETIRIZINE (ZYRTEC) 10 MG ORAL TABLET    Take 1 Tab (10 mg total) by mouth Once a day for 14 days    FLUTICASONE (FLONASE) 50 MCG/ACTUATION NASAL SPRAY, SUSPENSION    2 Sprays by Each Nostril route Once a day for 14 days     Follow-Up:     Arnold LongMakary, Chadi, MD  [address], [address]  [place] [id number]  [phone]    Schedule an appointment as soon as possible for a visit in 1 week  if symptoms continue.  try popping your ears frequently to see if this helps remove the fluid behind your left ear.    [facility]  [address]  [place] [id number]  [phone]    or new symptoms appear    Condition at Disposition: Stable        SCRIBE ATTESTATION STATEMENT  I Neil CrouchBrittany Waltz, SCRIBE scribed for Caprice RedGentle, Christee Mervine, MD on 08/07/2014 at 4:11 PM.     Documentation assistance provided for Kaelin Bonelli, Cristal Deerhristopher, MD  by Neil CrouchBrittany Waltz, SCRIBE. Information recorded by the scribe was done at my direction and has been reviewed and validated by me Corwin Kuiken, Cristal Deerhristopher, MD.

## 2014-08-07 NOTE — ED Nurses Note (Signed)
Current Discharge Medication List      START taking these medications.       Details    cetirizine 10 mg Tablet   Commonly known as:  ZYRTEC    10 mg, Oral, DAILY   Qty:  14 Tab   Refills:  0       fluticasone 50 mcg/actuation Spray, Suspension   Commonly known as:  FLONASE    2 Sprays, Each Nostril, DAILY   Qty:  1 Inhaler   Refills:  0         STOP taking these medications.          cefUROXime 500 mg Tablet   Commonly known as:  CEFTIN       ofloxacin 0.3 % Drops   Commonly known as:  FLOXIN       trimethoprim-sulfamethoxazole 800-160 mg Tablet   Commonly known as:  BACTRIM DS         Patient discharged home with family.  AVS reviewed with patient/care giver.  A written copy of the AVS and discharge instructions was given to the patient/care giver.  Questions sufficiently answered as needed.  Patient/care giver encouraged to follow up with PCP as indicated.  In the event of an emergency, patient/care giver instructed to call 911 or go to the nearest emergency room.   Pt in NAD and has equal and unlabored respirations.  Pt ambulated to lobby

## 2014-08-07 NOTE — Discharge Instructions (Signed)
Serous Otitis Media  Serous otitis media is fluid in the middle ear space. This space contains the bones for hearing and air. Air in the middle ear space helps to transmit sound.   The air gets there through the eustachian tube. This tube goes from the back of the nose (nasopharynx) to the middle ear space. It keeps the pressure in the middle ear the same as the outside world. It also helps to drain fluid from the middle ear space.  CAUSES   Serous otitis media occurs when the eustachian tube gets blocked. Blockage can come from:  · Ear infections.  · Colds and other upper respiratory infections.  · Allergies.  · Irritants such as cigarette smoke.  · Sudden changes in air pressure (such as descending in an airplane).  · Enlarged adenoids.  · A mass in the nasopharynx.  During colds and upper respiratory infections, the middle ear space can become temporarily filled with fluid. This can happen after an ear infection also. Once the infection clears, the fluid will generally drain out of the ear through the eustachian tube. If it does not, then serous otitis media occurs.  SIGNS AND SYMPTOMS   · Hearing loss.  · A feeling of fullness in the ear, without pain.  · Young children may not show any symptoms but may show slight behavioral changes, such as agitation, ear pulling, or crying.  DIAGNOSIS   Serous otitis media is diagnosed by an ear exam. Tests may be done to check on the movement of the eardrum. Hearing exams may also be done.  TREATMENT   The fluid most often goes away without treatment. If allergy is the cause, allergy treatment may be helpful. Fluid that persists for several months may require minor surgery. A small tube is placed in the eardrum to:  · Drain the fluid.  · Restore the air in the middle ear space.  In certain situations, antibiotic medicines are used to avoid surgery. Surgery may be done to remove enlarged adenoids (if this is the cause).  HOME CARE INSTRUCTIONS   · Keep children away from  tobacco smoke.  · Keep all follow-up visits as directed by your health care provider.  SEEK MEDICAL CARE IF:   · Your hearing is not better in 3 months.  · Your hearing is worse.  · You have ear pain.  · You have drainage from the ear.  · You have dizziness.  · You have serous otitis media only in one ear or have any bleeding from your nose (epistaxis).  · You notice a lump on your neck.  MAKE SURE YOU:  · Understand these instructions.    · Will watch your condition.    · Will get help right away if you are not doing well or get worse.       This information is not intended to replace advice given to you by your health care provider. Make sure you discuss any questions you have with your health care provider.     Document Released: 05/09/2003 Document Revised: 03/09/2014 Document Reviewed: 09/13/2012  Exit[employer]® Patient Information ©2016 [employer].

## 2014-08-22 ENCOUNTER — Ambulatory Visit (INDEPENDENT_AMBULATORY_CARE_PROVIDER_SITE_OTHER): Payer: MEDICAID | Admitting: Audiologist

## 2014-08-22 ENCOUNTER — Other Ambulatory Visit (INDEPENDENT_AMBULATORY_CARE_PROVIDER_SITE_OTHER): Payer: Self-pay

## 2014-08-22 ENCOUNTER — Encounter (INDEPENDENT_AMBULATORY_CARE_PROVIDER_SITE_OTHER): Payer: Self-pay | Admitting: Otolaryngology

## 2014-08-22 ENCOUNTER — Ambulatory Visit (INDEPENDENT_AMBULATORY_CARE_PROVIDER_SITE_OTHER): Payer: MEDICAID | Admitting: Otolaryngology

## 2014-08-22 VITALS — BP 122/82 | HR 104 | Temp 97.3°F | Ht 63.0 in | Wt 128.2 lb

## 2014-08-22 DIAGNOSIS — J342 Deviated nasal septum: Secondary | ICD-10-CM

## 2014-08-22 DIAGNOSIS — M7918 Myalgia, other site: Secondary | ICD-10-CM

## 2014-08-22 DIAGNOSIS — IMO0001 Reserved for inherently not codable concepts without codable children: Secondary | ICD-10-CM

## 2014-08-22 DIAGNOSIS — H918X2 Other specified hearing loss, left ear: Secondary | ICD-10-CM

## 2014-08-22 DIAGNOSIS — J309 Allergic rhinitis, unspecified: Secondary | ICD-10-CM

## 2014-08-22 DIAGNOSIS — Z720 Tobacco use: Secondary | ICD-10-CM

## 2014-08-22 DIAGNOSIS — F17210 Nicotine dependence, cigarettes, uncomplicated: Secondary | ICD-10-CM

## 2014-08-22 DIAGNOSIS — H9202 Otalgia, left ear: Secondary | ICD-10-CM

## 2014-08-22 DIAGNOSIS — H93292 Other abnormal auditory perceptions, left ear: Secondary | ICD-10-CM

## 2014-08-22 DIAGNOSIS — M791 Myalgia: Secondary | ICD-10-CM

## 2014-08-22 DIAGNOSIS — H9312 Tinnitus, left ear: Principal | ICD-10-CM

## 2014-08-22 MED ORDER — AZELASTINE 137 MCG (0.1 %) NASAL SPRAY AEROSOL
2.00 | INHALATION_SPRAY | Freq: Two times a day (BID) | NASAL | Status: DC
Start: 2014-08-22 — End: 2014-08-22

## 2014-08-22 MED ORDER — ASTEPRO 205.5 MCG (0.15 %) NASAL SPRAY
205.50 ug | Freq: Two times a day (BID) | NASAL | Status: DC
Start: 2014-08-22 — End: 2014-12-29

## 2014-08-22 MED ORDER — IBUPROFEN 600 MG TABLET
600.00 mg | ORAL_TABLET | Freq: Four times a day (QID) | ORAL | Status: AC
Start: 2014-08-22 — End: 2014-08-26

## 2014-08-22 NOTE — Patient Instructions (Signed)
Astelin - 2 spray each nostril - twice daily.  Continue Flonase and Claritin daily without change.  TMJ Instructions:   Soft diet.   Do not chew gum or eat hard, crunchy candies, nuts, or other hard foods.   Apply warm compress to area.   Do neck stretches.   Motrin 600 mg every 6 hours with food for the next 4 days and then stop. Instructed patient not to take Naprosyn for back pain while taking Motrin.  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  Reviewed audiogram with recommended repeat in 6 months for monitoring of left hearing.  Follow up visit in 2 months.

## 2014-08-22 NOTE — H&P (Addendum)
PATIENT NAME:  Lindsay Mccullough  MRN:  [id number]  DOB:  01-08-1986  DATE OF SERVICE: 08/22/2014    Chief Complaint:  Ear Pain      HPI:  Lindsay Mccullough is a 29 y.o. female who presents for evaluation of left ear pain and sinus drainage. Patient states she was diagnosed/treated for sinus and left ear infection 5 weeks ago by [facility] urgent care 5 weeks ago. Patient states she was treated with Zithromax course and fluticasone nasal spray 2 puffs each nostril once daily which she continues to use. Patient states left ear pain continues daily and has worsening. Patient described left ear pain as "throbbing" especially at night. Patient reports decreased left hearing and intermittent left ringing during this time. Patient denies right ear pain/ringing/decreased hearing and bilateral ear drainage. Patient reports lightheadedness but no actual spinning sensation when going from sitting to standing lasting seconds without nausea or vomiting. Patient states she is diagnosed/treated for ear infections monthly. Patient denies history of ear surgery. Patient reports a family history of mother with recent diagnosis of hearing loss in one ear and and also states that she has 2 aunt that are deaf in one ear. Patient states that Advil, Motrin and Tylenol do not reduce left ear pain. Patient reports left jaw popping. Patient denies recent dental work and history of TMJ disorder. Patient had all teeth extracted approximately 9 years ago. Patient wears upper denture but not lowered denture because it is ill fitting. Patient reports a history of environmental allergies which summer is the worse time of year without previous testing. Patient reports 1-2 yearly sinus infections during the summer months. Patient denies history of sinus surgery and nasal fracture. Patient reports nasal pressure, daily mostly clear nasal/postnasal drainage, frequent sneezing, and itchy/watery eyes. Patient states she no longer experiences nasal congestion  since using nasal spray. Patient does take Claritin once daily and has recently switch from Zyrtec. Patient reports a decrease in her sense of smell during the past 3 weeks which she feels her smell which is "off". Patient reports occasional disturbance of taste. Patient has a history of smoking - <1 PPD for the past 6 years. Patient denies history of kidney issues or stomach ulcers. No other complaints.    Past Medical History:  Past Medical History   Diagnosis Date    Asthma      Past Surgical History:  Past Surgical History   Procedure Laterality Date    Hx no surgical procedures       Family History:  Family History   Problem Relation Age of Onset    Congestive Heart Failure Mother     Hypertension Mother     High Cholesterol Mother     Heart Attack Mother     Hearing Loss Mother     Heart Disease Mother     Cirrhosis Father     Hypertension Father     High Cholesterol Father     Hypertension Brother      Social History:  History   Smoking status    Former Smoker   Smokeless tobacco    Not on file     History   Alcohol Use: Not on file     Social History     Occupational History    Not on file.       Medications:  Outpatient Prescriptions Marked as Taking for the 08/22/14 encounter (Office Visit) with Arnold Long, MD   Medication Sig    fluticasone [facility])  50 mcg/actuation Nasal Spray, Suspension 1 Spray by Each Nostril route Once a day    Ibuprofen (MOTRIN) 600 mg Oral Tablet Take 1 Tab (600 mg total) by mouth Every 6 hours for 4 days    loratadine (CLARITIN) 10 mg Oral Tablet Take 10 mg by mouth Once a day    Multivitamins with Minerals Oral Tablet Take 1 Tab by mouth Once a day    MULTIVITS,CA,MINERALS/IRON/FA (ONE-A-DAY WOMENS FORMULA ORAL) Take by mouth    naproxen (NAPROSYN) 500 mg Oral Tablet Take 500 mg by mouth Twice daily with food "Im taking it as needed".    [DISCONTINUED] Azelastine 137 mcg (0.1 %) Nasal Aerosol, Spray 2 Sprays by Nasal route Twice daily Use in each nostril  as directed       Allergies:  Allergies   Allergen Reactions    Amoxicillin Anaphylaxis    Penicillins Hives/ Urticaria       Review of Systems:  All other systems reviewed and found to be negative.    Physical Exam:  Blood pressure 122/82, pulse 104, temperature 36.3 C (97.3 F), temperature source Thermal Scan, height 1.6 m ( ), weight 58.151 kg (128 lb 3.2 oz), last menstrual period 08/04/2014.  Body mass index is 22.72 kg/(m^2).  General Appearance: Pleasant, cooperative, healthy, and in no acute distress.  Eyes: Conjunctivae/corneas clear, PERRLA, EOM's intact.  Head and Face: Normocephalic, atraumatic.  Face symmetric, no obvious lesions.   Pinnae: Normal shape and position.   External auditory canals:  Patent without inflammation or drainage.  Tympanic membranes:  Intact, translucent, midposition, middle ear aerated.  Nose:  External pyramid midline. Septum deviated to the right. Turbinates hypertrophied. Mucosa normal. No purulence, polyps, or crusts.   Oral Cavity/Oropharynx: No mucosal lesions, masses, or pharyngeal asymmetry. Full upper denture. No lower teeth.  TMJ: Bilateral crepitus. Left tenderness to palpation.  Neck:  No palpable thyroid, salivary gland, or neck masses.  Heme/Lymph:  No cervical adenopathy.  Cardiovascular:  Good perfusion of upper extremities.  No cyanosis of the hands or fingers.  Lungs: No apparent stridorous breathing. No acute distress.  Skin: Skin warm and dry.  Neurologic: Cranial nerves:  grossly intact.  Psychiatric:  Alert and oriented x 3.    Data Reviewed:   08/22/2014 Audiogram/Tympanogram performed by Dr. Smitty Cords, Au.D  Assessment:  Right: WNL with excellent WRS and Type A tympanogram  Left: WNL to borderline WNL with excellent WRS and Type A tympanogram  Impression: Normal hearing, AU. Asymmetry noted, L>R.       Assessment:    ICD-10-CM    1. Allergic rhinitis J30.9    2. 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 Otalgia of left ear H92.02    4. Myofascial pain M79.1       5. 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 Tobacco use Z72.0        Plan: Reviewed audiogram with recommended repeat audiogram in 6 months for monitoring of borderline asymmetrical decreased left hearing. Instructed patient to use Astelin - 2 spray each nostril twice daily - and continue Flonase and Claritin daily without change.  TMJ Instructions:   Soft diet.   Do not chew gum or eat hard, crunchy candies, nuts, or other hard foods.   Apply warm compress to area.   Do neck stretches.   Motrin 600 mg every 6 hours with food for the next 4 days and then stop. Instructed patient not to take Naprosyn for back pain while taking  Motrin.  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 Follow up visit in 2 months.    Orders Placed This Encounter    Ibuprofen (MOTRIN) 600 mg Oral Tablet       Return in about 2 months (around 10/22/2014) for Dr. Candelaria Celeste.    Patient was seen by Dr. Candelaria Celeste during visit.    Sandi Carne, PA-C  Supervising physician: Alric Seton, MD  Supervising physician: Arnold Long, MD  [facility]    I saw and examined the patient.  I formulated the plan with the PA. I reviewed the PA's note.  I agree with the findings and plan of care as documented in the PA's note.  Any exceptions/additions are edited/noted.  S: left ear pain. No other ear symptoms    O: normal ear exam. Left TMJ tenderness  Audiogram: normal hearing AU. Slightly worse on the left side    A/P: left TMJ disorder   Follow up in 2 months for the asymmetry     Marthann Abshier A. Candelaria Celeste, MD  Assistant Professor  Otolaryngology-Head and Neck Surgery  [employer] [facility]

## 2014-08-22 NOTE — Telephone Encounter (Signed)
Astepro approved by provider and sent to pharmacy.

## 2014-08-22 NOTE — Telephone Encounter (Signed)
Azelastine changed to brand astepro per insurance request. Pended for provider.

## 2014-09-20 ENCOUNTER — Ambulatory Visit (INDEPENDENT_AMBULATORY_CARE_PROVIDER_SITE_OTHER): Payer: MEDICAID | Admitting: OBSTETRICS/GYNECOLOGY

## 2014-09-20 ENCOUNTER — Ambulatory Visit (HOSPITAL_BASED_OUTPATIENT_CLINIC_OR_DEPARTMENT_OTHER): Payer: MEDICAID | Attending: OBSTETRICS/GYNECOLOGY

## 2014-09-20 ENCOUNTER — Encounter (INDEPENDENT_AMBULATORY_CARE_PROVIDER_SITE_OTHER): Payer: Self-pay | Admitting: OBSTETRICS/GYNECOLOGY

## 2014-09-20 VITALS — BP 120/76 | Temp 98.6°F | Ht 63.0 in | Wt 129.0 lb

## 2014-09-20 DIAGNOSIS — J45909 Unspecified asthma, uncomplicated: Secondary | ICD-10-CM | POA: Insufficient documentation

## 2014-09-20 DIAGNOSIS — Z3009 Encounter for other general counseling and advice on contraception: Principal | ICD-10-CM

## 2014-09-20 DIAGNOSIS — L680 Hirsutism: Secondary | ICD-10-CM

## 2014-09-20 DIAGNOSIS — Z302 Encounter for sterilization: Secondary | ICD-10-CM

## 2014-09-20 DIAGNOSIS — F17210 Nicotine dependence, cigarettes, uncomplicated: Secondary | ICD-10-CM

## 2014-09-20 LAB — THYROID STIMULATING HORMONE WITH FREE T4 REFLEX: TSH: 0.985 u[IU]/mL (ref 0.340–5.600)

## 2014-09-20 NOTE — Progress Notes (Signed)
[facility]  [address]  [place], [place]  [address]  [phone]      Subjective:    29 y.o. [phone]     Chief Complaint   Patient presents with    Sterilization     Patient presents for consultation to discussed permanent sterilization for contraception.   She has 3 children, different Dads.  She only has custody of 1 child.   She recently moved her from [facility].   She is not using contraception at this time.     She is not in a relationship at this time.   Last seen by GYN 3-4 years ago when daughter was born.    She has taken OCP in the past - often forgot pills.        REVIEW OF SYSTEMS :    Constitutional: Negative. Denies any fever, chills or fatigue   Gastrointestinal: Negative.  Denies any change in bowel habits, nausea or vomiting  Genitourinary:        See HPI       Patient's PMH/PSH/FH/SH/Meds/Allergies were all reviewed and noted in the EPIC chart on today's visit.    Past Medical History   Diagnosis Date    Asthma     H/O seasonal allergies     Dysplasia of cervix          Past Surgical History   Procedure Laterality Date    Hx no surgical procedures      Hx dilation and curettage           Family History   Problem Relation Age of Onset    Congestive Heart Failure Mother     Hypertension Mother     High Cholesterol Mother     Heart Attack Mother     Hearing Loss Mother     Heart Disease Mother     Cirrhosis Father     Hypertension Father     High Cholesterol Father     Hypertension Brother     Diabetes Paternal Grandmother     Colon Cancer Other     Pancreatic Cancer Neg Hx     Ovarian Cancer Neg Hx     Osteoporosis Neg Hx     Rectal Cancer Neg Hx     Uterine Fibroids Neg Hx     Uterine Cancer Neg Hx     Clotting Disorder Neg Hx     Breast Cancer Neg Hx     Cervical Cancer Neg Hx          Current Outpatient Prescriptions   Medication Sig Dispense Refill    ASTEPRO 0.15 % (205.5 mcg) Nasal Spray, Non-Aerosol 205.5 mcg by Nasal route  Twice daily 30 mL 2    fluticasone (FLONASE) 50 mcg/actuation Nasal Spray, Suspension 1 Spray by Each Nostril route Once a day      loratadine (CLARITIN) 10 mg Oral Tablet Take 10 mg by mouth Once a day      Multivitamins with Minerals Oral Tablet Take 1 Tab by mouth Once a day      MULTIVITS,CA,MINERALS/IRON/FA (ONE-A-DAY WOMENS FORMULA ORAL) Take by mouth      naproxen (NAPROSYN) 500 mg Oral Tablet Take 500 mg by mouth Twice daily with food "Im taking it as needed".       No current facility-administered medications for this visit.     Allergies   Allergen Reactions    Amoxicillin Anaphylaxis    Penicillins Hives/ Urticaria  History     Social History    Marital Status: Divorced     Spouse Name: N/A    Number of Children: N/A    Years of Education: N/A     Occupational History    Not on file.     Social History Main Topics    Smoking status: Light Tobacco Smoker    Smokeless tobacco: Not on file    Alcohol Use: Not on file    Drug Use: Not on file    Sexual Activity: Not on file     Other Topics Concern    Breast Self Exam No     Social History Narrative    No narrative on file           Objective:  BP 120/76 mmHg   Temp(Src) 37 C (98.6 F) (Oral)   Ht 1.6 m (5\' 3" )   Wt 58.514 kg (129 lb)   BMI 22.86 kg/m2   LMP 09/09/2014  Physical Exam:    Constitutional: She is oriented. She appears well-developed and well-nourished.     HENT: Head: Normocephalic.     Abdomen: She exhibits no distension. Soft. No tenderness. She has no rebound and no guarding.     Psychiatric: She has a normal mood and affect. Her behavior is normal.     Extremities:  No edema    Skin:  normal        Assessment & Plan:     ICD-10-CM    1. Consultation for female sterilization Z30.09    2. Cigarette smoker Z72.0    3. Request for sterilization Z78.9    4. Asthma J45.909    5. 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   Extensive contraceptive counseling done today. Medical vs. Surgical. Risk of regret with permanent sterilization as well as cost and success rates for reversal of tubal occlusion.   We reviewed OCP, Depo Provera, Nexplanon, NuvaRing, IUDs, all R/B and SE reviewed at length. The patient is not undecided and would like more time to decided.   She will schedule Annual exam and have decision at that time.     Encouraged smoking cessation.   Encouraged follow-up for annual gyn exam and Pap smear.     Greater that 50% of a 30 minute appointment was spent in consultation.       Orders Placed This Encounter    TESTOSTERONE, TOTAL AND FREE - [facility] ONLY    THYROID STIMULATING HORMONE WITH FREE T4 REFLEX    DEHYDROEPIANDROSTERONE SULFATE (DHEA-S), SERUM

## 2014-09-21 LAB — DEHYDROEPIANDROSTERONE SULFATE (DHEA-S), SERUM: DEHYDROEPIANDROSTERONE SULFATE, S: 246 ug/dL — ABNORMAL HIGH (ref 31–228)

## 2014-09-24 LAB — DHEA SULFATE
TESTOSTERONE, FREE: 4.1 pg/mL (ref 0.1–6.4)
TESTOSTERONE,TOTAL,LC/MS/MS: 39 ng/dL (ref 2–45)

## 2014-09-26 ENCOUNTER — Telehealth (INDEPENDENT_AMBULATORY_CARE_PROVIDER_SITE_OTHER): Payer: Self-pay | Admitting: OBSTETRICS/GYNECOLOGY

## 2014-09-26 DIAGNOSIS — E281 Androgen excess: Secondary | ICD-10-CM | POA: Insufficient documentation

## 2014-09-26 DIAGNOSIS — Z01812 Encounter for preprocedural laboratory examination: Secondary | ICD-10-CM

## 2014-09-26 DIAGNOSIS — E278 Other specified disorders of adrenal gland: Principal | ICD-10-CM

## 2014-09-26 DIAGNOSIS — R7989 Other specified abnormal findings of blood chemistry: Secondary | ICD-10-CM

## 2014-09-26 DIAGNOSIS — L680 Hirsutism: Secondary | ICD-10-CM

## 2014-09-26 NOTE — Telephone Encounter (Signed)
Unable to reach, left message with Grandmother.

## 2014-09-27 ENCOUNTER — Telehealth (INDEPENDENT_AMBULATORY_CARE_PROVIDER_SITE_OTHER): Payer: Self-pay | Admitting: OBSTETRICS/GYNECOLOGY

## 2014-09-27 NOTE — Telephone Encounter (Signed)
Patient is aware of elevated DHEA-S  Recommend CT scan to evaluate adrenal glands.   The patient verbalizes a reasonable understanding of her diagnosis and is in agreement with the plan of care.  All risks, benefits and alternatives were reviewed with the patient.  All questions were answered.

## 2014-10-04 MED ORDER — IOPAMIDOL 370 MG IODINE/ML (76 %) INTRAVENOUS SOLUTION
100.00 mL | INTRAVENOUS | Status: DC
Start: 2014-10-05 — End: 2014-10-06

## 2014-10-05 ENCOUNTER — Ambulatory Visit (INDEPENDENT_AMBULATORY_CARE_PROVIDER_SITE_OTHER): Payer: Self-pay | Admitting: OBSTETRICS/GYNECOLOGY

## 2014-10-05 ENCOUNTER — Other Ambulatory Visit (INDEPENDENT_AMBULATORY_CARE_PROVIDER_SITE_OTHER): Payer: Self-pay | Admitting: OBSTETRICS/GYNECOLOGY

## 2014-10-05 ENCOUNTER — Ambulatory Visit (HOSPITAL_BASED_OUTPATIENT_CLINIC_OR_DEPARTMENT_OTHER)
Admission: RE | Admit: 2014-10-05 | Discharge: 2014-10-05 | Disposition: A | Discharge status: Home / Self Care | Payer: MEDICAID | Source: Ambulatory Visit | Attending: OBSTETRICS/GYNECOLOGY | Admitting: OBSTETRICS/GYNECOLOGY

## 2014-10-05 DIAGNOSIS — L680 Hirsutism: Secondary | ICD-10-CM

## 2014-10-05 DIAGNOSIS — R7989 Other specified abnormal findings of blood chemistry: Secondary | ICD-10-CM

## 2014-10-05 DIAGNOSIS — E278 Other specified disorders of adrenal gland: Principal | ICD-10-CM

## 2014-10-05 DIAGNOSIS — E281 Androgen excess: Secondary | ICD-10-CM

## 2014-10-05 NOTE — Telephone Encounter (Signed)
Prednisone 50 mg po 13, 7, hrs prior to ct injection. Benadryl 50 mg one tablet 1 hour prior to ct injection. Per Dr. Elane Fritz  Instructed pt to set up ct scan appointment.

## 2014-10-08 ENCOUNTER — Ambulatory Visit (INDEPENDENT_AMBULATORY_CARE_PROVIDER_SITE_OTHER): Payer: Self-pay | Admitting: OBSTETRICS/GYNECOLOGY

## 2014-10-08 DIAGNOSIS — R7989 Other specified abnormal findings of blood chemistry: Secondary | ICD-10-CM

## 2014-10-08 DIAGNOSIS — E281 Androgen excess: Secondary | ICD-10-CM

## 2014-10-08 DIAGNOSIS — L680 Hirsutism: Secondary | ICD-10-CM

## 2014-10-08 DIAGNOSIS — E278 Other specified disorders of adrenal gland: Secondary | ICD-10-CM

## 2014-10-24 ENCOUNTER — Encounter (INDEPENDENT_AMBULATORY_CARE_PROVIDER_SITE_OTHER): Payer: Self-pay | Admitting: Otolaryngology

## 2014-10-24 ENCOUNTER — Ambulatory Visit (INDEPENDENT_AMBULATORY_CARE_PROVIDER_SITE_OTHER): Payer: MEDICAID | Admitting: Otolaryngology

## 2014-10-24 VITALS — BP 110/82 | HR 68 | Temp 96.8°F | Ht 63.0 in | Wt 117.0 lb

## 2014-10-24 DIAGNOSIS — H9202 Otalgia, left ear: Principal | ICD-10-CM

## 2014-10-24 NOTE — Progress Notes (Signed)
[facility]  [place], [place] [address]            [facility]    NAME:  Darcy Barbara  MRN:  [id number]  DOB:  [birth year]  DOS:  10/24/2014    Subjective  Sidnie Swalley is a 29 y.o. female who presents for follow up for ear pain and hearing loss. She is much better. Hearing is back to normal. Ear pain is much better after taking motrin. No otorrhea. No other issues with the ears.     Objective    Physical Exam  Filed Vitals:    10/24/14 0903   BP: 110/82   Pulse: 68   Temp: 36 C (96.8 F)   TempSrc: Thermal Scan   Height: 1.6 m ( )   Weight: 53.071 kg (117 lb)     General Appearance: Pleasant, cooperative, healthy, and in no acute distress.  Eyes: Conjunctivae/corneas clear  Head and Face: Normocephalic, atraumatic.  Face symmetric, no obvious lesions.   Pinnae: Normal shape and position.   External auditory canals:  Patent without inflammation.  Tympanic membranes:  Intact, translucent, midposition, middle ear aerated.  Psychiatric:  Alert and oriented x 3.      ASESSMENT  Left ear pain and hearing loss: resolved  TMJ disorder      PLAN:    Follow up as needed      Lajarvis Italiano A. Candelaria Celeste, MD  Assistant Professor  Otolaryngology-Head and Neck Surgery  [employer]

## 2014-10-29 ENCOUNTER — Ambulatory Visit (HOSPITAL_BASED_OUTPATIENT_CLINIC_OR_DEPARTMENT_OTHER)
Admission: RE | Admit: 2014-10-29 | Discharge: 2014-10-29 | Disposition: A | Discharge status: Home / Self Care | Payer: MEDICAID | Source: Ambulatory Visit | Attending: OBSTETRICS/GYNECOLOGY | Admitting: OBSTETRICS/GYNECOLOGY

## 2014-10-29 DIAGNOSIS — N200 Calculus of kidney: Secondary | ICD-10-CM | POA: Insufficient documentation

## 2014-10-29 DIAGNOSIS — L680 Hirsutism: Secondary | ICD-10-CM | POA: Insufficient documentation

## 2014-10-29 DIAGNOSIS — E281 Androgen excess: Secondary | ICD-10-CM

## 2014-10-29 DIAGNOSIS — R7989 Other specified abnormal findings of blood chemistry: Secondary | ICD-10-CM

## 2014-10-29 DIAGNOSIS — N281 Cyst of kidney, acquired: Secondary | ICD-10-CM | POA: Insufficient documentation

## 2014-10-29 DIAGNOSIS — N8320 Unspecified ovarian cysts: Secondary | ICD-10-CM | POA: Insufficient documentation

## 2014-10-29 DIAGNOSIS — E278 Other specified disorders of adrenal gland: Secondary | ICD-10-CM

## 2014-10-29 MED ORDER — IOPAMIDOL 370 MG IODINE/ML (76 %) INTRAVENOUS SOLUTION
100.00 mL | INTRAVENOUS | Status: AC
Start: 2014-10-29 — End: 2014-10-29
  Administered 2014-10-29: 65 mL via INTRAVENOUS
  Filled 2014-10-29: qty 100

## 2014-10-29 MED ORDER — GASTROVIEW 15 ML IN 500 ML SW ORAL SOLUTION - CHI
500.00 mL | ORAL | Status: AC
Start: 2014-10-29 — End: 2014-10-29
  Administered 2014-10-29: 500 mL via ORAL

## 2014-11-07 ENCOUNTER — Ambulatory Visit (INDEPENDENT_AMBULATORY_CARE_PROVIDER_SITE_OTHER): Payer: Self-pay | Admitting: OBSTETRICS/GYNECOLOGY

## 2014-11-08 ENCOUNTER — Telehealth (INDEPENDENT_AMBULATORY_CARE_PROVIDER_SITE_OTHER): Payer: Self-pay | Admitting: OBSTETRICS/GYNECOLOGY

## 2014-11-08 NOTE — Telephone Encounter (Signed)
Called patient to review CT results. Unable to reach, left message for her to call back.

## 2014-11-09 ENCOUNTER — Encounter (INDEPENDENT_AMBULATORY_CARE_PROVIDER_SITE_OTHER): Payer: Self-pay

## 2014-11-09 ENCOUNTER — Ambulatory Visit (INDEPENDENT_AMBULATORY_CARE_PROVIDER_SITE_OTHER): Payer: Medicaid (Managed Care) | Admitting: Physician Assistant

## 2014-11-09 ENCOUNTER — Ambulatory Visit (INDEPENDENT_AMBULATORY_CARE_PROVIDER_SITE_OTHER): Payer: Self-pay | Admitting: OBSTETRICS/GYNECOLOGY

## 2014-11-09 ENCOUNTER — Telehealth (INDEPENDENT_AMBULATORY_CARE_PROVIDER_SITE_OTHER): Payer: Self-pay | Admitting: OBSTETRICS/GYNECOLOGY

## 2014-11-09 VITALS — BP 110/80 | HR 84 | Temp 98.7°F | Resp 12 | Ht 63.6 in | Wt 128.6 lb

## 2014-11-09 DIAGNOSIS — R05 Cough: Secondary | ICD-10-CM

## 2014-11-09 DIAGNOSIS — J209 Acute bronchitis, unspecified: Secondary | ICD-10-CM

## 2014-11-09 MED ORDER — BENZONATATE 100 MG PO CAPS
100.0000 mg | ORAL_CAPSULE | Freq: Three times a day (TID) | ORAL | Status: DC | PRN
Start: 2014-11-09 — End: 2015-03-05

## 2014-11-09 MED ORDER — PREDNISONE 20 MG PO TABS
40.0000 mg | ORAL_TABLET | Freq: Every day | ORAL | Status: AC
Start: 2014-11-09 — End: 2014-11-14

## 2014-11-09 NOTE — Telephone Encounter (Signed)
Pt returning your call to talk about CAT scan results.

## 2014-11-09 NOTE — Telephone Encounter (Signed)
Pt called requesting her CT scan results.  Pt was informed her results came back normal.  Not sure if you still need to her in the office or not.  Pt would like to know if you could call her.          Lindsay Mccullough

## 2014-11-09 NOTE — Progress Notes (Signed)
Subjective:    Patient ID: Regina Adams is a 29 y.o. female.    URI   This is a new problem. Episode onset: since 8/30. The problem has been unchanged. There has been no fever. Associated symptoms include congestion, coughing (dry non productive), rhinorrhea, sinus pain and a sore throat. Pertinent negatives include no rash or vomiting. She has tried nothing for the symptoms.       The following portions of the patient's history were reviewed and updated as appropriate: allergies, current medications, past family history, past medical history, past social history, past surgical history and problem list.    Review of Systems   Constitutional: Negative for fever and chills.   HENT: Positive for congestion, rhinorrhea and sore throat.    Respiratory: Positive for cough (dry non productive).    Gastrointestinal: Negative for vomiting.   Skin: Negative for rash.   All other systems reviewed and are negative.        Objective:    BP 110/80 mmHg  Pulse 84  Temp(Src) 98.7 F (37.1 C) (Oral)  Resp 12  Ht 1.615 m (5' 3.6")  Wt 58.333 kg (128 lb 9.6 oz)  BMI 22.37 kg/m2  SpO2 100%  LMP 10/21/2014 (Approximate)    Physical Exam   Constitutional: She is oriented to person, place, and time. She appears well-developed and well-nourished. No distress.   HENT:   Right Ear: Tympanic membrane, external ear and ear canal normal. Tympanic membrane is not injected, not erythematous, not retracted and not bulging. No middle ear effusion.   Left Ear: Tympanic membrane, external ear and ear canal normal. Tympanic membrane is not injected, not erythematous, not retracted and not bulging.  No middle ear effusion.   Nose: Mucosal edema and rhinorrhea present. Right sinus exhibits no maxillary sinus tenderness and no frontal sinus tenderness. Left sinus exhibits no maxillary sinus tenderness and no frontal sinus tenderness.   Mouth/Throat: Uvula is midline and mucous membranes are normal. Posterior oropharyngeal erythema  present. No oropharyngeal exudate or posterior oropharyngeal edema.   Eyes: Conjunctivae are normal.   Neck: Neck supple.   Cardiovascular: Normal rate, regular rhythm and normal heart sounds.  Exam reveals no gallop and no friction rub.    No murmur heard.  Pulmonary/Chest: Effort normal and breath sounds normal. No respiratory distress. She has no wheezes. She has no rales.   Lymphadenopathy:     She has no cervical adenopathy.   Neurological: She is alert and oriented to person, place, and time.   Skin: Skin is warm and dry. No rash noted. She is not diaphoretic. No pallor.   Psychiatric: She has a normal mood and affect. Her behavior is normal.   Nursing note and vitals reviewed.       Labs  No results found for this or any previous visit (from the past 24 hour(s)).    Radiology  No results found.        Assessment and Plan:       Regina Adams was seen today for cough.    Diagnoses and all orders for this visit:    Acute bronchitis, unspecified organism  Orders:  -     benzonatate (TESSALON PERLES) 100 MG capsule; Take 1 capsule (100 mg total) by mouth 3 (three) times daily as needed for Cough.  -     predniSONE (DELTASONE) 20 MG tablet; Take 2 tablets (40 mg total) by mouth daily.      -Discussed likely viral etiology and treatments  such as nasal saline sprays, intranasal steroids, sore throat comfort measures, and anti-tussives.  -Discussed no indication for antibiotic treatment at this time, but to RTC if new/worsening symptoms  -Follow up with PCP if symptoms lasting longer than expected  -Patient/guardian agreed to plan          Allie Bossier, PA  [facility]  11/11/2014  9:10 AM

## 2014-11-09 NOTE — Patient Instructions (Signed)
Follow up with your Primary Care Physician or Return to Clinic if symptoms persist or worsen. Patient/Family verbalizes understanding.    Studies have shown that antibiotics are not effective against your viral illness.  Your symptoms may last up to 3 weeks    For cough - Delsym for a dry sounding cough and Robitussin or Mucinex for a wet sounding cough. A humidifier may also help.  For nasal congestion - nasal saline spray, netty pot, warm steam, flonase  For sore throat - warm liquids, honey, salt water gargles, lemon, and pain relieving sprays.      Bronchitis, Viral (Adult: No Abx)    You have a viral bronchitis. This illness is contagious during the first few days and is spread through the air by coughing and sneezing, or by direct contact (touching the sick person and then touching your own eyes, nose, or mouth).  Most viral illnesses resolve within 10-14 days with rest and simple home remedies, although they may sometimes last for several weeks. Antibiotics will not kill a virus and are generally not prescribed for this condition.  Home Care:  1. If symptoms are severe, rest at home for the first 2-3 days. When resuming activity, don't let yourself become overly tired.  2. Do not smoke and avoid the smoke of others.  3. You may use acetaminophen (Tylenol) or ibuprofen (Motrin, Advil) to control fever or pain, unless another pain medicine was prescribed. [NOTE: If you have chronic liver or kidney disease or ever had a stomach ulcer or GI bleeding, talk with your doctor before using these medicines.] (Aspirin should never be used in anyone under 68 years of age who is ill with a fever. It may cause severe liver damage.)  4. Your appetite may be poor so a light diet is fine. Avoid dehydration by drinking 6-8 glasses of fluids per day (water, sport drinks such as Gatorade, juices, tea, soup, etc.). Extra fluids will help loosen secretions in the nose and lung.  5. Over-the-counter cold medicines will not shorten  the length of the illness, but may be helpful for cough (Robitussin DM), sore throat (Chloraseptic lozenges or spray), nasal and sinus congestion (Actifed or Sudafed). [NOTE: Do not use decongestants if you have high blood pressure.]  Follow Up  with your doctor or as directed by our staff if you are not improving over the next week.  NOTE: If you are age 70 or older, or if you have chronic asthma or COPD, we recommend a PNEUMOCOCCAL VACCINATION every five years and a yearly INFLUENZAVACCINATION (FLU-SHOT) every autumn. Ask your doctor about this. If you had an X-ray, a radiologist will review it. You will be notified of any new findings that may affect your care.]  Get Prompt Medical Attention  if any of the following occur:   Fever over 100.57F (38.0C) for more than three days   Trouble breathing, wheezing or pain with breathing   Coughing up blood or increased amounts of colored sputum   Weakness, drowsiness, headache, facial pain, ear pain or a stiff neck   2000-2015 [employer] [address], [place], [place] [address]. All rights reserved. This information is not intended as a substitute for professional medical care. Always follow your healthcare professional's instructions.

## 2014-11-12 ENCOUNTER — Telehealth (INDEPENDENT_AMBULATORY_CARE_PROVIDER_SITE_OTHER): Payer: Self-pay

## 2014-11-12 NOTE — Telephone Encounter (Signed)
Patient answered call. Patient stated she's "feeling much better". Patient advised to call/come back to office with any concerns.  Autumn N Bunch  4:08 PM

## 2014-12-21 ENCOUNTER — Ambulatory Visit (INDEPENDENT_AMBULATORY_CARE_PROVIDER_SITE_OTHER): Payer: MEDICAID | Admitting: OBSTETRICS/GYNECOLOGY

## 2014-12-21 VITALS — BP 102/78 | Temp 98.0°F | Ht 63.0 in | Wt 130.6 lb

## 2014-12-21 DIAGNOSIS — R7989 Other specified abnormal findings of blood chemistry: Secondary | ICD-10-CM

## 2014-12-21 DIAGNOSIS — E281 Androgen excess: Secondary | ICD-10-CM

## 2014-12-21 DIAGNOSIS — J45909 Unspecified asthma, uncomplicated: Secondary | ICD-10-CM

## 2014-12-21 DIAGNOSIS — L680 Hirsutism: Secondary | ICD-10-CM

## 2014-12-21 DIAGNOSIS — E278 Other specified disorders of adrenal gland: Secondary | ICD-10-CM

## 2014-12-21 DIAGNOSIS — Z3009 Encounter for other general counseling and advice on contraception: Secondary | ICD-10-CM

## 2014-12-21 DIAGNOSIS — N83202 Unspecified ovarian cyst, left side: Secondary | ICD-10-CM

## 2014-12-21 DIAGNOSIS — F17210 Nicotine dependence, cigarettes, uncomplicated: Secondary | ICD-10-CM

## 2014-12-21 MED ORDER — NORGESTIMATE 0.25 MG-ETHINYL ESTRADIOL 0.035 MG TABLET
1.0000 | ORAL_TABLET | Freq: Every day | ORAL | Status: DC
Start: 2014-12-21 — End: 2015-11-21

## 2014-12-21 NOTE — Progress Notes (Addendum)
[facility]  [address]  [place], [place]  [id number]  [phone]      Name of Patient: Lindsay PeaksJessica Mccullough  DOB: 10/16/1985  MRN: [id number]    Date of Service: 12/21/2014      Subjective:    29 y.o. [id number]     Chief Complaint   Patient presents with    Results     Here to discuss CT results and plan of care.     She would also like to discuss contraceptive options.   No other complaints.   She is still bothered by the hirsutism.       REVIEW OF SYSTEMS :    Constitutional: Negative. Denies any fever, chills or fatigue   Gastrointestinal: Negative.  Denies any change in bowel habits, nausea or vomiting  Genitourinary:        See HPI        Patient's PMH/PSH/FH/SH/Meds/Allergies were all reviewed and noted in the EPIC chart on today's visit.    Past Medical History   Diagnosis Date    Asthma     H/O seasonal allergies     Dysplasia of cervix          Past Surgical History   Procedure Laterality Date    Hx no surgical procedures      Hx dilation and curettage           Family History   Problem Relation Age of Onset    Congestive Heart Failure Mother     Hypertension Mother     High Cholesterol Mother     Heart Attack Mother     Hearing Loss Mother     Heart Disease Mother     Cirrhosis Father     Hypertension Father     High Cholesterol Father     Hypertension Brother     Diabetes Paternal Grandmother     Colon Cancer Other     Pancreatic Cancer Neg Hx     Ovarian Cancer Neg Hx     Osteoporosis Neg Hx     Rectal Cancer Neg Hx     Uterine Fibroids Neg Hx     Uterine Cancer Neg Hx     Clotting Disorder Neg Hx     Breast Cancer Neg Hx     Cervical Cancer Neg Hx          Current Outpatient Prescriptions   Medication Sig Dispense Refill    ASTEPRO 0.15 % (205.5 mcg) Nasal Spray, Non-Aerosol 205.5 mcg by Nasal route Twice daily 30 mL 2    fluticasone (FLONASE) 50 mcg/actuation Nasal Spray, Suspension 1 Spray by Each Nostril route Once a day      hydrOXYzine  pamoate (VISTARIL) 50 mg Oral Capsule Take 50 mg by mouth Three times a day as needed for Itching      Multivitamins with Minerals Oral Tablet Take 1 Tab by mouth Once a day      MULTIVITS,CA,MINERALS/IRON/FA (ONE-A-DAY WOMENS FORMULA ORAL) Take by mouth      naproxen (NAPROSYN) 500 mg Oral Tablet Take 500 mg by mouth Twice daily with food "Im taking it as needed".      Norgestimate-Ethinyl Estradiol (SPRINTEC, 28,) 0.25-35 mg-mcg Oral Tablet Take 1 Tab by mouth Once a day 28 Tab 11     No current facility-administered medications for this visit.     Allergies   Allergen Reactions    Amoxicillin Anaphylaxis    Iodine  Blisters on left arm    Penicillins Hives/ Urticaria     Social History     Social History    Marital Status: Divorced     Spouse Name: N/A    Number of Children: N/A    Years of Education: N/A     Occupational History    Not on file.     Social History Main Topics    Smoking status: Light Tobacco Smoker    Smokeless tobacco: Not on file    Alcohol Use: Not on file    Drug Use: Not on file    Sexual Activity: Not on file     Other Topics Concern    Breast Self Exam No     Social History Narrative           Objective:  BP 102/78 mmHg   Temp(Src) 36.7 C (98 F) (Oral)   Ht 1.6 m ( )   Wt 59.24 kg (130 lb 9.6 oz)   BMI 23.14 kg/m2   LMP 12/16/2014 (Exact Date)  Physical Exam:    Constitutional: She is oriented. She appears well-developed and well-nourished.     HENT: Head: Normocephalic.  F[place]iman [place] 4 Hirsutism    Abdomen: She exhibits no distension. Soft. No tenderness. She has no rebound and no guarding.     Psychiatric: She has a normal mood and affect. Her behavior is normal.     Extremities:  No edema    Skin:  normal      10/29/2014    CT OF THE ABDOMEN AND PELVIS WITHOUT AND WITH CONTRAST, (DLP 1,562 mGy-cm):    CLINICAL HISTORY: Hirsutism. Hyperandrogenemia. Elevated DHEA.     COMMENTS: CT of the abdomen and pelvis was performed using adrenal mass protocol.  The adrenal glands are normal in size and morphology. No adrenal gland masses are identified.    The visualized lung bases are clear. The liver, spleen and pancreas are unremarkable. There are several punctate non-obstructive renal calculi, measuring 1-2 mm. There are no ureteral calculi or hydronephrosis. There are three incidental simple cysts in the right kidney measuring up to 12 mm. There is a simple cyst in the left kidney measuring 12 mm. The gallbladder is non-dilated. No calcified gallstones. Unremarkable bowel. Normal appendix. No inflammatory changes, fluid collections or adenopathy. There is a 3-cm cyst in the left ovary. No right ovarian cysts are identified. No free fluid. Unremarkable bony structures.    IMPRESSION:      1. Normal adrenal glands. No adrenal gland masses.  2. Incidental 3 cm left ovarian cyst. No free fluid.  3. 1-2 mm nonobstructive bilateral renal calculi.    Assessment & Plan:     ICD-10-CM    1. 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 Elevated dehydroepiandrosterone (DHEA) level (HCC) E27.8    4. Cigarette smoker F17.210    5. Asthma J45.909    6. 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   Elevated DHEAS but normal Tot/Free testosterone.     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 Discussed treatment options of OCP, off label use of spironolactone and Electrolysis.     Contraceptive counseling done today. Discussed all options, OCP, Depo Provera, Nexplanon, IUD's, Nuva Ring. All R/B and SE reviewed at length. She would like to proceed combined OCP with added benefit to treat hirsutism.    Discussed if  no improvement or worsening hair growth -  Will consider repeat labs and referral to REI.     The patient verbalizes a reasonable understanding of her diagnosis and is in agreement with the plan of care.  All risks,  benefits and alternatives were reviewed with the patient.  All questions were answered.           Orders Placed This Encounter    US PELVIS COMPLETE (TA/TV)    Norgestimate-Ethinyl Estradiol (SPRINTEC, 28,) 0.25-35 mg-mcg Oral Tablet       Return in about 3 months (around 03/23/2015) for follow-up.

## 2014-12-29 ENCOUNTER — Encounter

## 2014-12-29 ENCOUNTER — Ambulatory Visit (INDEPENDENT_AMBULATORY_CARE_PROVIDER_SITE_OTHER): Payer: MEDICAID | Admitting: Family Medicine

## 2014-12-29 VITALS — BP 106/65 | HR 98 | Temp 98.6°F | Resp 16 | Ht 64.8 in | Wt 132.6 lb

## 2014-12-29 DIAGNOSIS — B9789 Other viral agents as the cause of diseases classified elsewhere: Secondary | ICD-10-CM

## 2014-12-29 DIAGNOSIS — J069 Acute upper respiratory infection, unspecified: Secondary | ICD-10-CM

## 2014-12-29 DIAGNOSIS — J029 Acute pharyngitis, unspecified: Secondary | ICD-10-CM

## 2014-12-29 NOTE — Progress Notes (Signed)
Lindsay Mccullough  Date of Service: 12/29/2014    Chief complaint:   Chief Complaint   Patient presents with    Sore Throat     X 3 Days    Chills    Hoarse     Subjective  29 y.o. female here with dry cough, nasal congestion and sore throat x 3 days. No fever/chills. Contact with someone with H,F&M. Has been taking tylenol and Motrin with some improvement. No tobacco use or h/o asthma.    Past Medical History  Current Outpatient Prescriptions   Medication Sig    fluticasone (FLONASE) 50 mcg/actuation Nasal Spray, Suspension 1 Spray by Each Nostril route Once a day    hydrOXYzine pamoate (VISTARIL) 50 mg Oral Capsule Take 50 mg by mouth Three times a day as needed for Itching    MULTIVITS,CA,MINERALS/IRON/FA (ONE-A-DAY WOMENS FORMULA ORAL) Take by mouth    Norgestimate-Ethinyl Estradiol (SPRINTEC, 28,) 0.25-35 mg-mcg Oral Tablet Take 1 Tab by mouth Once a day     Allergies   Allergen Reactions    Amoxicillin Anaphylaxis    Iodine      Blisters on left arm    Penicillins Hives/ Urticaria     Past Medical History   Diagnosis Date    Asthma     H/O seasonal allergies     Dysplasia of cervix          Past Surgical History   Procedure Laterality Date    Hx no surgical procedures      Hx dilation and curettage           Family History   Problem Relation Age of Onset    Congestive Heart Failure Mother     Hypertension Mother     High Cholesterol Mother     Heart Attack Mother     Hearing Loss Mother     Heart Disease Mother     Cirrhosis Father     Hypertension Father     High Cholesterol Father     Hypertension Brother     Diabetes Paternal Grandmother     Colon Cancer Other     Pancreatic Cancer Neg Hx     Ovarian Cancer Neg Hx     Osteoporosis Neg Hx     Rectal Cancer Neg Hx     Uterine Fibroids Neg Hx     Uterine Cancer Neg Hx     Clotting Disorder Neg Hx     Breast Cancer Neg Hx     Cervical Cancer Neg Hx          Social History     Social History    Marital Status: Divorced     Spouse  Name: N/A    Number of Children: N/A    Years of Education: N/A     Social History Main Topics    Smoking status: Former Smoker    Smokeless tobacco: Never Used    Alcohol Use: 0.0 oz/week     0 Standard drinks or equivalent per week    Drug Use: Not on file    Sexual Activity: Not on file     Other Topics Concern    Breast Self Exam No     Social History Narrative    No narrative on file     ROS: Per HPI above and no chest pain/nausea/vomiting/Diarrhea/rash    Objective  Vitals: BP 106/65 mmHg   Pulse 98   Temp(Src) 37 C (98.6 F) (Oral)   Resp  16   Ht 1.646 m (5' 4.8")   Wt 60.147 kg (132 lb 9.6 oz)   BMI 22.20 kg/m2   SpO2 98%   LMP 12/16/2014 (Exact Date)  General: no distress   Eyes: Conjunctiva clear. PERRL  HEENT: mucous membranes moist, Oropharynx without erythema or exudate, TM's Clear  Neck: no adenopathy  Lungs: clear to auscultation bilaterally.   Cardiovascular: Heart regular rate and rhythm  Skin: Skin warm and dry, no rashes  Psych: Normal affect        Assessment/Plan  1. Sore throat    2. Viral URI with cough      -Rapid strep negative, recommend symptomatic therapy, may use OTC antitussive; discussed potential side effects. Discussed expected course of illness. Increase water intake, rest,  may use Tylenol/Motrin for fevers/aches PRN. RTC if symptoms worsen or fail to improve as anticipated.      Orders Placed This Encounter    POCT RAPID STREP A       Roxanne Gates, MD

## 2014-12-29 NOTE — Progress Notes (Signed)
BP 106/65 mmHg  Pulse 98  Temp(Src) 37 C (98.6 F) (Oral)  Resp 16  Ht 1.646 m (5' 4.8")  Wt 60.147 kg (132 lb 9.6 oz)  BMI 22.20 kg/m2  SpO2 98%  LMP 12/16/2014 (Exact Date)  Lindsay SirenCharles Madilynn Mccullough, RTR  12/29/2014, 09:56

## 2014-12-29 NOTE — Patient Instructions (Signed)
[facility]  [address]  [place], [place] [id number]  Phone: [phone]  Fax: [phone]               Open Mon - Sat 8:00am - 8:00pm L[place] CrockerSun Noon- 8:00pm                                                              ~ Closed Thanksgiving and Christmas Day     Attending Caregiver: Roxanne GatesEsther K Tobie Perdue, MD      Today's orders:   Orders Placed This Encounter    POCT RAPID STREP A        Prescription(s) E-Rx to:  [facility] - [place], [place] - [address]    ________________________________________________________________________  Short Term Disability and Family Medical Leave Act  [facility] does NOT provide assistance with any disability applications.  If you feel your medical condition requires you to be on disability, you will need to follow up with  Your primary care physician or a specialist.  We apologize for any inconvenience.    For Medication Prescribed by [facility]:  As an Urgent Care facility, our clinic does NOT offer prescription refills over the telephone.    If you need more of the medication one of our medical providers prescribed, you will  Either need to be re-evaluated by us or see your primary care physician.    ________________________________________________________________________      It is very important that we have a phone number that is the single best way to contact you in the event that we become aware of important clinical information or concerns after your discharge.  If the phone number you provided at registration is NOT this number you should inform staff and registration prior to leaving.      Your treatment and evaluation today was focused on identifying and treating potentially emergent conditions based on your presenting signs, symptoms, and history.  The resulting initial clinical impression and treatment plan is not intended to be definitive or a substitute for a full physical examination and evaluation by your primary care  provider.  If your symptoms persist, worsen, or you develop any new or concerning symptoms, you need to be evaluated.      If you received x-rays during your visit, be aware that the final and formal interpretation of those films by a radiologist may occur after your discharge.  If there is a significant discrepancy identified after your discharge, we will contact you at the telephone number provided at registration.      If you received a pelvic exam, you may have cultures pending for sexually transmitted diseases.  Positive cultures are reported to the [facility] Department of Health as required by state law.  You should be contacted if you cultures are positive.  We will not contact you if they are negative.  You did NOT receive a PAP smear (the screening test for cervical).  This specific test for women is best performed by your gynecologist or primary care provider when indicated.      If you are over 31 year old, we cannot discuss your personal health information with a parent, spouse, family member, or anyone else without your express consent.  This does not include those  who have legitimate access to your records and information to assist in your care under the provisions of HIPAA [facility] Portability and Accountability Act) law, or those to whom you have previously given express written consent to do so, such a legal guardian or Power of [place].      You may have received medication that may cause you to feel drowsy and/or light headed for several hours.  You may even experience some amnesia of your stay.  You should avoid operating a motor vehicle or performing any activity requiring complete alertness or coordination until you feel fully awake (approximately 24-48 hours).  Avoid alcoholic beverages.  You may also have a dry mouth for several hours.  This is a normal side effect and will disappear as the effects of the medication wear off.      Instructions discussed with patient upon discharge by  clinical staff with all questions answered.  Please call [facility] [phone]) if any further questions.  Go immediately to the emergency department if any concern or worsening symptoms.      Roxanne Gates, MD 12/29/2014, 10:07

## 2014-12-29 NOTE — Progress Notes (Signed)
12/29/14 1000   Rapid Strep   Rapid Strep Negative   Lot# [id number]   Expiration Date 06/30/16   Internal Control Valid yes   Nurse initials CRS   Ordering Physician [facility]

## 2014-12-30 ENCOUNTER — Encounter (INDEPENDENT_AMBULATORY_CARE_PROVIDER_SITE_OTHER): Payer: Self-pay

## 2014-12-30 NOTE — Progress Notes (Signed)
Called patient to check status.  Left message advising patient to call with any questions or concerns.

## 2015-01-10 ENCOUNTER — Ambulatory Visit (INDEPENDENT_AMBULATORY_CARE_PROVIDER_SITE_OTHER): Payer: Medicaid (Managed Care) | Admitting: Family Medicine

## 2015-01-10 ENCOUNTER — Encounter (INDEPENDENT_AMBULATORY_CARE_PROVIDER_SITE_OTHER): Payer: Self-pay | Admitting: Family Medicine

## 2015-01-10 VITALS — BP 108/72 | HR 88 | Temp 98.5°F | Resp 16 | Ht 63.0 in | Wt 130.0 lb

## 2015-01-10 DIAGNOSIS — J020 Streptococcal pharyngitis: Secondary | ICD-10-CM

## 2015-01-10 DIAGNOSIS — J029 Acute pharyngitis, unspecified: Secondary | ICD-10-CM

## 2015-01-10 LAB — POCT RAPID STREP A: Rapid Strep A Screen POCT: POSITIVE — AB

## 2015-01-10 MED ORDER — AZITHROMYCIN 250 MG PO TABS
250.0000 mg | ORAL_TABLET | Freq: Every day | ORAL | Status: DC
Start: 2015-01-10 — End: 2015-03-05

## 2015-01-10 NOTE — Progress Notes (Signed)
Subjective:    Patient ID: Regina Adams is a 29 y.o. female.    URI   This is a recurrent problem. The current episode started 1 to 4 weeks ago. The problem has been unchanged. There has been no fever. Associated symptoms include congestion, rhinorrhea, sinus pain and a sore throat. Pertinent negatives include no abdominal pain, chest pain, coughing, diarrhea, ear pain, headaches, joint pain, joint swelling, nausea, neck pain, plugged ear sensation, rash, sneezing, vomiting or wheezing.       The following portions of the patient's history were reviewed and updated as appropriate: allergies, current medications, past family history, past medical history, past social history, past surgical history and problem list.    Review of Systems   Constitutional: Negative for fever, chills, activity change, appetite change and fatigue.   HENT: Positive for congestion, postnasal drip, rhinorrhea, sinus pressure and sore throat. Negative for drooling, ear discharge, ear pain, mouth sores, sneezing, trouble swallowing and voice change.    Respiratory: Negative for cough, choking, chest tightness, shortness of breath, wheezing and stridor.    Cardiovascular: Negative for chest pain, palpitations and leg swelling.   Gastrointestinal: Negative for nausea, vomiting, abdominal pain, diarrhea and constipation.   Musculoskeletal: Negative for joint pain and neck pain.   Skin: Negative for rash.   Neurological: Negative for dizziness, syncope and headaches.         Objective:    BP 108/72 mmHg  Pulse 88  Temp(Src) 98.5 F (36.9 C) (Oral)  Resp 16  Ht 1.6 m (5\' 3" )  Wt 58.968 kg (130 lb)  BMI 23.03 kg/m2    Physical Exam   Constitutional: She is oriented to person, place, and time. She appears well-developed and well-nourished. No distress.   HENT:   Head: Normocephalic and atraumatic.   Right Ear: External ear normal.   Left Ear: External ear normal.   Mouth/Throat: Oropharynx is clear and moist. No oropharyngeal exudate.    Boggy turbinates b/l.   Eyes: Conjunctivae and EOM are normal. Pupils are equal, round, and reactive to light. Right eye exhibits no discharge. Left eye exhibits no discharge.   Neck: Normal range of motion. Neck supple.   Cardiovascular: Normal rate, regular rhythm and normal heart sounds.    No murmur heard.  Pulmonary/Chest: Effort normal and breath sounds normal. No respiratory distress. She has no wheezes. She has no rales.   Musculoskeletal: She exhibits no edema.   Lymphadenopathy:     She has no cervical adenopathy.   Neurological: She is alert and oriented to person, place, and time.   Skin: Skin is warm. No rash noted. She is not diaphoretic.   Psychiatric: She has a normal mood and affect.         Assessment and Plan:       Regina Adams was seen today for sinus problem.    Diagnoses and all orders for this visit:    Pharyngitis, unspecified etiology  -     POCT RAPID STREP A    Streptococcal sore throat  -     azithromycin (ZITHROMAX Z-PAK) 250 MG tablet; Take 1 tablet (250 mg total) by mouth daily. Tale 2 tablet first day then 1 tablet daily for 4 days        Advised plenty of liquids,tylenol/ibuprofen PRN for pain/fever.F/U with PCP/urgent care soon if symptoms persists/gets worse more than 2-3 days. If symptoms continues need CXR,labs.  Advise to watch for worsening symptoms,with those seek medical help immediatly  F/u  with PCP 5-7 days  F/u PRN    Regina Adams  [facility]  01/10/2015  10:42 AM

## 2015-01-10 NOTE — Patient Instructions (Signed)
Pharyngitis: Strep (Confirmed)    You have had a positive test for strep throat. Strep throat is a contagious illness. It is spread by coughing, kissing or by touching others after touching your mouth or nose. Symptoms include throat pain which is worse with swallowing, aching all over, headache and fever. It is treated with antibiotic medication. This should help you start to feel better within 1-2 days.  Home care   Rest at home. Drink plenty of fluids to avoid dehydration.   No work or school for the first 2 days of taking the antibiotics. After this time, you will not be contagious. You can then return to school or work if you are feeling better.   The antibiotic medication must be taken for the full 10 days, even if you feel better. This is very important to ensure the infection is treated.It is also important to prevent drug-resistent organisms from developing.If you were given an antibiotic shot, no more antibiotics are needed.   You may use acetaminophen (Tylenol) or ibuprofen (Motrin, Advil) to control pain or fever, unless another medicine was prescribed for this. (NOTE: If you have chronic liver or kidney disease or ever had a stomach ulcer or GI bleeding, talk with your doctor before using these medicines.)   Throat lozenges or sprays (such as Chloraseptic) help reduce pain. Gargling with warm salt water will also reduce throat pain. Dissolve 1/2 teaspoon of salt in 1 glass of warm water. This may be useful just before meals.   Soft foods are okay. Avoid salty or spicy foods.  Follow-up care  Follow up with your healthcare provider or our staff if you are not improving over the next week.  When to seek medical advice  Call your healthcare provider right away if any of these occur:   Feveras directed by your doctor   New or worsening ear pain, sinus pain, or headache   Painful lumps in the back of neck   Stiff neck   Lymph nodes are getting larger or becoming soft in the  middle   Inability to swallow liquids, excessive drooling,or inability to open mouth wide due to throat pain   Signs of dehydration (very dark urine or no urine, sunken eyes, dizziness)   Trouble breathing or noisy breathing   Muffled voice   New rash   2000-2015 [employer] [address], [place] [address]. All rights reserved. This information is not intended as a substitute for professional medical care. Always follow your healthcare professional's instructions.

## 2015-01-13 ENCOUNTER — Telehealth (INDEPENDENT_AMBULATORY_CARE_PROVIDER_SITE_OTHER): Payer: Self-pay

## 2015-01-13 NOTE — Telephone Encounter (Signed)
Courtesy call attempted. Left patient a voice mail with our number to call with any questions or concerns.

## 2015-03-05 ENCOUNTER — Ambulatory Visit (INDEPENDENT_AMBULATORY_CARE_PROVIDER_SITE_OTHER): Payer: Medicaid (Managed Care) | Admitting: Family

## 2015-03-05 ENCOUNTER — Encounter (INDEPENDENT_AMBULATORY_CARE_PROVIDER_SITE_OTHER): Payer: Self-pay

## 2015-03-05 VITALS — BP 111/77 | HR 91 | Temp 98.1°F | Resp 18 | Ht 63.0 in | Wt 127.0 lb

## 2015-03-05 DIAGNOSIS — R059 Cough, unspecified: Secondary | ICD-10-CM

## 2015-03-05 DIAGNOSIS — R0689 Other abnormalities of breathing: Secondary | ICD-10-CM

## 2015-03-05 DIAGNOSIS — R509 Fever, unspecified: Secondary | ICD-10-CM

## 2015-03-05 DIAGNOSIS — R05 Cough: Secondary | ICD-10-CM

## 2015-03-05 LAB — POCT INFLUENZA A/B
POCT Rapid Influenza A AG: NEGATIVE
POCT Rapid Influenza B AG: NEGATIVE

## 2015-03-05 MED ORDER — BENZONATATE 100 MG PO CAPS
100.0000 mg | ORAL_CAPSULE | Freq: Three times a day (TID) | ORAL | Status: DC | PRN
Start: 2015-03-05 — End: 2015-04-10

## 2015-03-05 NOTE — Patient Instructions (Signed)
What is Acute Bronchitis?    Acute or short-term bronchitis last for days or weeks. Itoccurs when the bronchial tubes (airways in the lungs) are irritated by a virus, bacteria, or allergen. This causes a cough that produces yellow or greenish mucus.  Inside healthy lungs  Air travels in and out of the lungs through the airways. The linings of these airways produce sticky mucus. This mucus traps particles that enter the lungs. Tiny structures called cilia then sweep the particles out of the airways.     Lungs with bronchitis  Bronchitis often occurs when a cold or the flu virus. The airways become inflamed (red and swollen).There isa deep "hacking" cough from the extra mucus. Other symptoms may include:   Wheezing   Chest discomfort   Shortness of breath   Mild fever  A second infection, this time due to bacteria, may then occur. And, airways irritated by allergens or smoke are more likely to get infected.     Making a diagnosis  A physical exam, medical history, and certain tests help your health care provider make the diagnosis.  Medicalhistory  Your health care providerwillask youabout your symptoms.  The exam  Your provider listens toyour chest for signs of congestion. He or she may also checkyour ears, nose, and throat.  Possible tests   A sputum testfor bacteria. This requires a sample of mucus from the lungs.   A nasal or throat swabfor bacterial infection.   A chest X-rayif your health care provider suspects pneumonia.   Tests to check for an underlying condition,such as allergies, asthma, or COPD. You may be referred to a specialist for further lung function testing.  Treating a cough  The main treatment for bronchitis is easing symptoms. Avoiding smoke, allergens, and other things that trigger coughing can often help. If the infection is bacterial, antibiotics may be used. During the illness, it's important to get plenty of sleep. To ease symptoms:   Don't smoke, and avoid secondhand  smoke.   Use a humidifier, or breathe in steam from a hot shower. This may help loosen mucus.   Drink a lot of water and juice. They can soothe the throat and may help thin mucus.   Sit up or use extra pillows when in bed to help lessen coughing and congestion.   Ask your providerabout usingcough medicine, pain and fever medication, or a decongestant.  Antibiotics  Most cases of bronchitis are caused by cold or flu viruses. Antibiotics don't treat viral illness. Taking antibiotics when they are not needed increases your risk of getting an infection later that is antibiotic-resistant.Your provider will prescribe antibiotics if the infection is caused by bacteria. If they are prescribed:   Take the medication until it is used up, even if symptoms have improved. If you don't, the bronchitis may come back.   Take them as directed. For instance, some medications should be taken with food.   Ask your provider or pharmacist what side effects are common, and what to do about them.  Follow-up care  You should go see your provider again in 2 to 3 weeks. By this time, symptoms should have improved. An infection that lasts longer may signal a more serious problem.  Prevention   Avoid tobacco smoke. If you smoke, quit. Stay away from smoky places. Ask friends and family not to smoke around you, or in your home or car.   Get checked forallergies.   Ask your provider about getting a yearly flu shot, and   pneumoccocal or pneumonia shots.   Wash your hands often. This helps reduce the chance of picking up viruses that cause colds and flu.       2000-2015 [employer]. [address], [place] [address]. All rights reserved. This information is not intended as a substitute for professional medical care. Always follow your healthcare professional's instructions.

## 2015-03-05 NOTE — Progress Notes (Signed)
Subjective:    Patient ID: Regina Adams is a 30 y.o. female.    URI   This is a new problem. Episode onset: 2-3 days. The problem has been gradually worsening. The maximum temperature recorded prior to her arrival was 100.4 - 100.9 F. Associated symptoms include congestion, coughing, rhinorrhea and vomiting (after coughing). Pertinent negatives include no abdominal pain, chest pain, diarrhea, dysuria, ear pain, headaches, nausea, plugged ear sensation, rash, sinus pain, sneezing, sore throat, swollen glands or wheezing. She has tried acetaminophen for the symptoms. The treatment provided mild relief.     The following portions of the patient's history were reviewed and updated as appropriate: allergies, current medications, past family history, past medical history, past social history, past surgical history and problem list.    Review of Systems   Constitutional: Positive for fever (100.7), chills and fatigue. Negative for diaphoresis.   HENT: Positive for congestion, postnasal drip, rhinorrhea and sinus pressure. Negative for ear discharge, ear pain, hearing loss, sneezing and sore throat.    Eyes: Negative for pain and redness.   Respiratory: Positive for cough. Negative for shortness of breath and wheezing.         Denies history of asthma, COPD, pneumonia, and CHF. Reports quitting smoking 2 months ago   Cardiovascular: Negative for chest pain.   Gastrointestinal: Positive for vomiting (after coughing). Negative for nausea, abdominal pain and diarrhea.        Eating and drinking normally.  Stooling and voiding well.   Genitourinary: Negative for dysuria and difficulty urinating.   Musculoskeletal: Positive for myalgias.   Skin: Negative for rash.   Neurological: Negative for headaches.   All other systems reviewed and are negative.        Objective:    BP 111/77 mmHg  Pulse 91  Temp(Src) 98.1 F (36.7 C) (Oral)  Resp 18  Ht 1.6 m (5\' 3" )  Wt 57.607 kg (127 lb)  BMI 22.50 kg/m2  LMP  02/17/2015    Physical Exam   Constitutional: She is oriented to person, place, and time. Vital signs are normal. She appears well-developed and well-nourished.  Non-toxic appearance. She does not have a sickly appearance. She does not appear ill. No distress.   HENT:   Head: Normocephalic and atraumatic.   Right Ear: Hearing, tympanic membrane, external ear and ear canal normal.   Left Ear: Hearing, tympanic membrane, external ear and ear canal normal.   Nose: Mucosal edema and rhinorrhea present. Right sinus exhibits frontal sinus tenderness (mild). Right sinus exhibits no maxillary sinus tenderness. Left sinus exhibits frontal sinus tenderness (mild). Left sinus exhibits no maxillary sinus tenderness.   Mouth/Throat: Oropharynx is clear and moist. No oropharyngeal exudate, posterior oropharyngeal edema, posterior oropharyngeal erythema or tonsillar abscesses.   Eyes: Conjunctivae are normal. Right eye exhibits no discharge. Left eye exhibits no discharge.   Cardiovascular: Normal rate, regular rhythm and normal heart sounds.  Exam reveals no gallop and no friction rub.    No murmur heard.  Pulmonary/Chest: Effort normal and breath sounds normal. No respiratory distress. She has no decreased breath sounds. She has no wheezes. She has no rhonchi. She has no rales.   CTA, no wheezes, rhonchi, rales.   Lymphadenopathy:        Head (right side): No submental, no submandibular, no tonsillar, no preauricular, no posterior auricular and no occipital adenopathy present.        Head (left side): No submental, no submandibular, no tonsillar, no preauricular, no posterior  auricular and no occipital adenopathy present.     She has no cervical adenopathy.        Right: No supraclavicular adenopathy present.        Left: No supraclavicular adenopathy present.   Neurological: She is alert and oriented to person, place, and time.   Skin: Skin is warm and dry. No rash noted. She is not diaphoretic.   Psychiatric: She has a normal  mood and affect. Her behavior is normal.   Nursing note and vitals reviewed.     Lab Results from today's visit:  Recent Results (from the past 12 hour(s))   POCT Influenza A/B    Collection Time: 03/05/15 12:33 PM   Result Value Ref Range    POCT QC Pass     POCT Rapid Influenza A AG Negative Negative    POCT Rapid Influenza B AG Negative Negative     Radiology Results from today's visit:  No results found.        Assessment and Plan:       Regina Adams was seen today for chest congestion.    Diagnoses and all orders for this visit:    Cough  -     POCT Influenza A/B  -     benzonatate (TESSALON PERLES) 100 MG capsule; Take 1 capsule (100 mg total) by mouth 3 (three) times daily as needed for Cough.    -POCT Flu A/B: negative.    -Tessalon for cough during the day.  -Take Tylenol or Ibuprofen every 4-6 hours as needed for pain or fever.  -Drink lots of water, monitor PO intake and urine output for hydration.  -Flonase will help with inflammation of the nose and post nasal drip.  -Use saline spray in the nose, or a humidifier to loosen discharge.  -Remember to wash your hands.  -Work note provided.    Go to the ER for any new or worsening symptoms that concern you.  Follow-up with your primary care doctor or return to Urgent Care if your symptoms do not improve.    Patient agrees with the plan.        Carolin Sicks, NP  [facility]  03/05/2015  1:53 PM

## 2015-03-08 ENCOUNTER — Telehealth (INDEPENDENT_AMBULATORY_CARE_PROVIDER_SITE_OTHER): Payer: Self-pay

## 2015-03-08 NOTE — Telephone Encounter (Signed)
Courtesy call made; no voicemail or answer at phone number provided.

## 2015-03-25 ENCOUNTER — Ambulatory Visit (INDEPENDENT_AMBULATORY_CARE_PROVIDER_SITE_OTHER): Payer: MEDICAID | Admitting: OBSTETRICS/GYNECOLOGY

## 2015-03-25 ENCOUNTER — Encounter (INDEPENDENT_AMBULATORY_CARE_PROVIDER_SITE_OTHER): Payer: Self-pay | Admitting: OBSTETRICS/GYNECOLOGY

## 2015-03-25 VITALS — BP 110/72 | Temp 98.8°F | Ht 63.0 in | Wt 127.0 lb

## 2015-03-25 DIAGNOSIS — E281 Androgen excess: Secondary | ICD-10-CM

## 2015-03-25 DIAGNOSIS — Z79899 Other long term (current) drug therapy: Secondary | ICD-10-CM

## 2015-03-25 DIAGNOSIS — L680 Hirsutism: Secondary | ICD-10-CM

## 2015-03-25 DIAGNOSIS — E278 Other specified disorders of adrenal gland: Secondary | ICD-10-CM

## 2015-03-25 DIAGNOSIS — Z3041 Encounter for surveillance of contraceptive pills: Secondary | ICD-10-CM

## 2015-03-25 DIAGNOSIS — R7989 Other specified abnormal findings of blood chemistry: Secondary | ICD-10-CM

## 2015-03-25 NOTE — Progress Notes (Signed)
[facility]  [address]  [place], [place]  [address]  [phone]        Name of Patient: Lindsay Mccullough  DOB: [birth year]  MRN: [id number]    Date of Service: 03/25/2015      Subjective:    30 y.o. [id number] presenting for a med check. 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 Patient believes the sprintec is helping, she is having less and please with this. She is having regular light periods. She denies any HA, CP, SOB, irregular VB. Patient has no other complaints.    Chief Complaint   Patient presents with    FOLLOW UP MED CHECK     birth control       REVIEW OF SYSTEMS :    Constitutional: Negative. Denies any fever, chills or fatigue   HENT: Negative.  Denies any headaches, visual changes  Cardiovascular: Negative.  Denies any chest pain or palpitations  Respiratory: Negative.  Denies any difficulty breathing or cough  Gastrointestinal: Negative.  Denies any change in bowel habits, nausea or vomiting  Genitourinary:        See HPI   Musculoskeletal: Negative.   Neurological: Negative.    Psychiatric: Negative.      Patient's PMH/PSH/FH/SH/Meds/Allergies were all reviewed and noted in the EPIC chart on today's visit.    Past Medical History   Diagnosis Date    Asthma     Dysplasia of cervix     H/O seasonal allergies          Past Surgical History   Procedure Laterality Date    Hx no surgical procedures      Hx dilation and curettage           Family History   Problem Relation Age of Onset    Congestive Heart Failure Mother     Hypertension Mother     High Cholesterol Mother     Heart Attack Mother     Hearing Loss Mother     Heart Disease Mother     Cirrhosis Father     Hypertension Father     High Cholesterol Father     Hypertension Brother     Diabetes Paternal Grandmother     Colon Cancer Other     Pancreatic Cancer Neg Hx     Ovarian Cancer Neg Hx     Osteoporosis Neg Hx     Rectal Cancer Neg Hx     Uterine Fibroids Neg Hx      Uterine Cancer Neg Hx     Clotting Disorder Neg Hx     Breast Cancer Neg Hx     Cervical Cancer Neg Hx          Current Outpatient Prescriptions   Medication Sig Dispense Refill    fluticasone (FLONASE) 50 mcg/actuation Nasal Spray, Suspension 1 Spray by Each Nostril route Once a day      hydrOXYzine pamoate (VISTARIL) 50 mg Oral Capsule Take 50 mg by mouth Three times a day as needed for Itching      MULTIVITS,CA,MINERALS/IRON/FA (ONE-A-DAY WOMENS FORMULA ORAL) Take by mouth      Norgestimate-Ethinyl Estradiol (SPRINTEC, 28,) 0.25-35 mg-mcg Oral Tablet Take 1 Tab by mouth Once a day 28 Tab 11     No current facility-administered medications for this visit.      Allergies   Allergen Reactions    Amoxicillin Anaphylaxis    Iodine      Blisters  on left arm    Penicillins Hives/ Urticaria     Social History     Social History    Marital status: Divorced     Spouse name: N/A    Number of children: N/A    Years of education: N/A     Occupational History    Not on file.     Social History Main Topics    Smoking status: Former Smoker    Smokeless tobacco: Never Used    Alcohol use 0.0 oz/week     0 Standard drinks or equivalent per week    Drug use: Not on file    Sexual activity: Not on file     Other Topics Concern    Breast Self Exam No     Social History Narrative           Objective:  Visit Vitals    BP 110/72    Temp 37.1 C (98.8 F)    Ht 1.6 m ( )    Wt 57.6 kg (127 lb)    LMP 03/17/2015 (Exact Date)    Breastfeeding No    BMI 22.5 kg/m2     Physical Exam:    Constitutional: She is oriented. She appears well-developed and well-nourished.     HENT: Head: Normocephalic.     Cards: RRR, S1S2     Lungs: CTAB    Psychiatric: She has a normal mood and affect. Her behavior is normal.     Extremities:  No edema or cyanosis    Skin:  normal    GU: Deferred       Assessment & Plan:    Doing well on sprintec will continue use, discussed adding spironolactone if needed    She did quit  smoking - encouraged continued cessation    BP WNL , NO SE from OCP.           ICD-10-CM    1. 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 Medication management Z79.899    3. 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      Greater that 50% of a 15 minute appointment was spent in consultation.         Return in about 1 year (around 03/24/2016), or if symptoms worsen or fail to improve.     Dondra Spry, DO 03/25/2015 17:01  PGY-1  [facility]  Obstetrics and Gynecology      I saw and examined the patient. I reviewed the resident's note. I agree with the findings and plan of care as documented in the resident's note. Any exceptions/additions are edited/noted. Jamillia Closson Detrick, DO 03/25/2015, 17:02

## 2015-04-10 ENCOUNTER — Ambulatory Visit (INDEPENDENT_AMBULATORY_CARE_PROVIDER_SITE_OTHER): Payer: Medicaid (Managed Care) | Attending: Family

## 2015-04-10 ENCOUNTER — Ambulatory Visit (INDEPENDENT_AMBULATORY_CARE_PROVIDER_SITE_OTHER): Payer: Medicaid (Managed Care) | Admitting: Family

## 2015-04-10 ENCOUNTER — Encounter (INDEPENDENT_AMBULATORY_CARE_PROVIDER_SITE_OTHER): Payer: Self-pay | Admitting: Family

## 2015-04-10 VITALS — BP 119/90 | HR 89 | Temp 98.4°F | Resp 18 | Ht 63.0 in | Wt 128.0 lb

## 2015-04-10 DIAGNOSIS — X58XXXA Exposure to other specified factors, initial encounter: Secondary | ICD-10-CM

## 2015-04-10 DIAGNOSIS — Y998 Other external cause status: Secondary | ICD-10-CM

## 2015-04-10 DIAGNOSIS — Y9289 Other specified places as the place of occurrence of the external cause: Secondary | ICD-10-CM

## 2015-04-10 DIAGNOSIS — M79671 Pain in right foot: Secondary | ICD-10-CM

## 2015-04-10 DIAGNOSIS — Y9389 Activity, other specified: Secondary | ICD-10-CM

## 2015-04-10 NOTE — Progress Notes (Signed)
Subjective:    Patient ID: Regina Adams is a 30 y.o. female.    Foot Injury   The incident occurred at an unknown time. The injury mechanism is unknown. There is an injury to the right foot. There is an injury to the right great toe. The pain is severe. It is unlikely that a foreign body is present. Associated symptoms include pain when bearing weight. Pertinent negatives include no chest pain, no numbness, no nausea, no vomiting, no inability to bear weight and no cough.       The following portions of the patient's history were reviewed and updated as appropriate: allergies, current medications, past family history, past medical history, past social history, past surgical history and problem list.    Review of Systems   Constitutional: Negative for fever.   HENT: Negative for congestion.    Respiratory: Negative for cough and shortness of breath.    Cardiovascular: Negative for chest pain.   Gastrointestinal: Negative for nausea, vomiting and diarrhea.   Genitourinary: Negative for difficulty urinating.   Musculoskeletal: Positive for gait problem. Negative for myalgias and arthralgias.        Reports bottom of the right foot pain. States it is worse because she has to walk up and down stairs at work. No known injury. When asked, endorses that it feels like a pebble in her shoe.   Skin: Negative for color change, pallor, rash and wound.   Neurological: Negative for numbness.   Hematological: Does not bruise/bleed easily.   All other systems reviewed and are negative.        Objective:    BP 119/90 mmHg  Pulse 89  Temp(Src) 98.4 F (36.9 C) (Oral)  Resp 18  Ht 1.6 m (5\' 3" )  Wt 58.06 kg (128 lb)  BMI 22.68 kg/m2  LMP 03/17/2015    Physical Exam   Constitutional: She is oriented to person, place, and time. Vital signs are normal. She appears well-developed and well-nourished.  Non-toxic appearance. She does not have a sickly appearance. She does not appear ill. No distress.   HENT:   Head:  Normocephalic.   Eyes: Right eye exhibits no discharge. Left eye exhibits no discharge.   Cardiovascular: Normal rate.    Pulmonary/Chest: Effort normal. No respiratory distress.   Musculoskeletal:        Right ankle: She exhibits normal range of motion, no swelling, no ecchymosis, no deformity, no laceration and normal pulse (+2 pedal pulse). No lateral malleolus and no medial malleolus tenderness found.        Left foot: There is tenderness. There is normal range of motion, no bony tenderness, no swelling, normal capillary refill, no crepitus, no deformity and no laceration.   Bottom of the right foot pain.    Neurological: She is alert and oriented to person, place, and time.   Skin: Skin is warm, dry and intact. No rash noted.   Psychiatric: She has a normal mood and affect.   Nursing note and vitals reviewed.    Lab Results from today's visit:  No results found for this or any previous visit (from the past 12 hour(s)).    Radiology Results from today's visit:  X-ray Foot Right Ap Lateral And Oblique    04/10/2015  No observed fracture or dislocation of the right foot. No foreign body is seen. ReadingStation:[id number]        Assessment and Plan:       Regina Adams was seen today for foot problem.  Diagnoses and all orders for this visit:    Right foot pain  -     X-ray Foot Right AP Lateral and Oblique  -     Ambulatory referral to Podiatry    Right foot xray: no fracture    Discussed normal xray with the patient. Discussed that she could have a sprain, mortons neuroma, or something else.    -Referral to podiatry.  -Rest.  -Ice for 20 minutes on and 20 minutes off, be sure to protect the skin  -Compression. ACE wrap given.  -Elevation.  -Tylenol or Motrin every 4-6 hours as needed for pain.    Go to the ER for any new or worsening symptoms that concern you.  Follow-up with your primary care doctor or return to Urgent Care if your symptoms do not improve.    Patient agrees with the plan.        Carolin Sicks,  NP  [facility]  04/10/2015  7:43 PM

## 2015-04-10 NOTE — Patient Instructions (Signed)
Foot and Ankle Exercises: Single-Leg Heel Raise  This exercise is designed to stretch and strengthen your feet and ankles. Before beginning the exercise, read through all the instructions. While exercising, breathe normally and don't bounce. If you feel any pain, stop the exercise. If pain persists, inform your health care provider:   Stand, using a sturdy counter for balance only. Lift 1 foot and stand with your weight on the other foot.   Rise up on your toes, then lower back onto your heel.   Repeat 10 times. Do 3sets a day.   [address] [employer] [address] [place], [place] [address]. All rights reserved. This information is not intended as a substitute for professional medical care. Always follow your healthcare professional's instructions.

## 2015-04-13 ENCOUNTER — Telehealth (INDEPENDENT_AMBULATORY_CARE_PROVIDER_SITE_OTHER): Payer: Self-pay

## 2015-04-13 NOTE — Telephone Encounter (Signed)
Attempted to return patient's call. VM has not been set up. Unable to leave message.    Libby Maw  6:35 PM

## 2015-04-15 ENCOUNTER — Encounter (INDEPENDENT_AMBULATORY_CARE_PROVIDER_SITE_OTHER): Payer: Self-pay | Admitting: Family

## 2015-04-15 NOTE — Progress Notes (Signed)
Patient in the clinic today requesting a work note to "be out of work until my foot heals".  Seen here 04/10/15. Right foot xray negative. Referral given for podiatry.  Work note provided to be out of work for 1 week after her visit last week.  Educated patient to f/u with podiatry. States she has an appt on Wednesday - 2 days.    Carolin Sicks, NP

## 2015-04-16 ENCOUNTER — Ambulatory Visit: Payer: MEDICAID | Attending: Podiatrist

## 2015-04-16 DIAGNOSIS — M064 Inflammatory polyarthropathy: Secondary | ICD-10-CM | POA: Insufficient documentation

## 2015-04-16 LAB — CBC W/AUTO DIFF
BASOPHIL #: 0 x10ˆ3/uL (ref 0.00–0.10)
BASOPHIL %: 0 % (ref 0–3)
EOSINOPHIL #: 0 x10ˆ3/uL (ref 0.00–0.50)
EOSINOPHIL %: 1 % (ref 0–5)
HCT: 41.5 % (ref 36.0–45.0)
HGB: 13.9 g/dL (ref 12.0–15.5)
LYMPHOCYTE #: 2.5 x10ˆ3/uL (ref 1.00–4.80)
LYMPHOCYTE %: 45 % — ABNORMAL HIGH (ref 15–43)
MCH: 32.8 pg (ref 27.5–33.2)
MCH: 32.8 pg (ref 27.5–33.2)
MCHC: 33.4 g/dL (ref 32.0–36.0)
MCV: 98.3 fL — ABNORMAL HIGH (ref 82.0–97.0)
MONOCYTE #: 0.3 x10ˆ3/uL (ref 0.20–0.90)
MONOCYTE %: 6 % (ref 5–12)
MPV: 8.2 fL (ref 7.4–10.5)
NEUTROPHIL #: 2.7 x10ˆ3/uL (ref 1.50–6.50)
NEUTROPHIL %: 48 % (ref 43–76)
NEUTROPHIL %: 48 % (ref 43–76)
PLATELETS: 238 x10ˆ3/uL (ref 150–450)
RBC: 4.22 x10ˆ6/uL (ref 4.00–5.10)
RDW: 12.6 % (ref 11.0–16.0)
WBC: 5.5 x10ˆ3/uL (ref 4.0–11.0)

## 2015-04-16 LAB — BASIC METABOLIC PANEL
ANION GAP: 8 mmol/L (ref 3–11)
BUN/CREA RATIO: 19 (ref 6–22)
BUN: 12 mg/dL (ref 6–20)
CALCIUM: 9.7 mg/dL (ref 8.6–10.3)
CHLORIDE: 103 mmol/L (ref 101–111)
CO2 TOTAL: 27 mmol/L (ref 22–32)
CREATININE: 0.64 mg/dL (ref 0.44–1.00)
ESTIMATED GFR: >60 mL/min/1.73mˆ2 (ref >60)
GLUCOSE: 97 mg/dL (ref 70–110)
POTASSIUM: 4.5 mmol/L (ref 3.4–5.1)
SODIUM: 138 mmol/L (ref 136–145)

## 2015-04-16 LAB — RHEUMATOID FACTOR, SERUM: RHEUMATOID FACTOR: <15 [IU]/mL (ref <=30)

## 2015-04-16 LAB — URIC ACID: URIC ACID: 4 mg/dL (ref 2.6–7.0)

## 2015-04-16 LAB — SEDIMENTATION RATE: ERYTHROCYTE SEDIMENTATION RATE (ESR): 18 mm/h (ref 0–20)

## 2015-04-17 LAB — ANA (ANTINUCLEAR ANTIBODIES), SERUM
ANTI-NUCLEAR ANTIBODIES,QUALITATIVE: NEGATIVE
ANTI-NUCLEAR ANTIBODIES,QUANTITATIVE: 0.5 {index_val} (ref <=0.90)

## 2015-04-17 LAB — LYME DISEASE SEROLOGY, WITH IMMUNOBLOT REFLEX, SERUM: LYME DISEASE SEROLOGY, SERUM: NEGATIVE

## 2015-05-08 ENCOUNTER — Encounter (INDEPENDENT_AMBULATORY_CARE_PROVIDER_SITE_OTHER): Payer: Self-pay | Admitting: Family Medicine

## 2015-05-08 ENCOUNTER — Ambulatory Visit (INDEPENDENT_AMBULATORY_CARE_PROVIDER_SITE_OTHER): Payer: Medicaid (Managed Care) | Admitting: Family Medicine

## 2015-05-08 ENCOUNTER — Ambulatory Visit (INDEPENDENT_AMBULATORY_CARE_PROVIDER_SITE_OTHER): Payer: Medicaid (Managed Care) | Attending: Family Medicine

## 2015-05-08 VITALS — BP 131/79 | HR 100 | Temp 98.5°F | Resp 18 | Ht 63.0 in | Wt 126.0 lb

## 2015-05-08 DIAGNOSIS — R0602 Shortness of breath: Secondary | ICD-10-CM

## 2015-05-08 DIAGNOSIS — R059 Cough, unspecified: Secondary | ICD-10-CM

## 2015-05-08 DIAGNOSIS — R05 Cough: Secondary | ICD-10-CM

## 2015-05-08 MED ORDER — ALBUTEROL SULFATE HFA 108 (90 BASE) MCG/ACT IN AERS
2.0000 | INHALATION_SPRAY | Freq: Four times a day (QID) | RESPIRATORY_TRACT | Status: DC | PRN
Start: 2015-05-08 — End: 2016-07-30

## 2015-05-08 MED ORDER — PREDNISONE 10 MG PO TABS
ORAL_TABLET | ORAL | Status: DC
Start: 2015-05-08 — End: 2015-07-24

## 2015-05-08 NOTE — Patient Instructions (Signed)
What Is Acute Bronchitis?  Acute or short-term bronchitis last for days or weeks. Itoccurs when the bronchial tubes (airways in the lungs) are irritated by a virus, bacteria, or allergen. This causes a cough that produces yellow or greenish mucus.  Inside healthy lungs    Air travels in and out of the lungs through the airways. The linings of these airways produce sticky mucus. This mucus traps particles that enter the lungs. Tiny structures called cilia then sweep the particles out of the airways.     Healthy airway: Airways are normally open. Air moves in and out easily.      Healthy cilia: Tiny, hairlike cilia sweep mucus and particles up and out of the airways.   Lungs with bronchitis  Bronchitis often occurs with a cold or the flu virus. The airways become inflamed (red and swollen).There isa deep "hacking" cough from the extra mucus. Other symptoms may include:   Wheezing   Chest discomfort   Shortness of breath   Mild fever  A second infection, this time due to bacteria, may then occur. And airways irritated by allergens or smoke are more likely to get infected.       Inflamed airway: Inflammation and extra mucus narrow the airway, causing shortness of breath.      Impaired cilia: Extra mucus impairs cilia, causing congestion and wheezing. Smoking makes the problem worse.   Making a diagnosis  A physical exam, health history, and certain tests help your healthcare provider make the diagnosis.  Healthhistory  Your healthcare providerwillask youabout your symptoms.  The exam  Your provider listens toyour chest for signs of congestion. He or she may also checkyour ears, nose, and throat.  Possible tests   A sputum testfor bacteria. This requires a sample of mucus from the lungs.   A nasal or throat swabfor bacterial infection.   A chest X-rayif your healthcare provider thinks you have pneumonia.   Tests to check for an underlying condition,such as allergies, asthma, or COPD. You may need  to see a specialist for more lung function testing.  Treating a cough  The main treatment for bronchitis is easing symptoms. Avoiding smoke, allergens, and other things that trigger coughing can often help. If the infection is bacterial, you may be given antibiotics. During the illness, it's important to get plenty of sleep. To ease symptoms:   Don't smoke, and avoid secondhand smoke.   Use a humidifier, or breathe in steam from a hot shower. This may help loosen mucus.   Drink a lot of water and juice. They can soothe the throat and may help thin mucus.   Sit up or use extra pillows when in bed to help lessen coughing and congestion.   Ask your providerabout usingcough medicine, pain and fever medicine, or a decongestant.  Antibiotics  Most cases of bronchitis are caused by cold or flu viruses. Antibiotics don't treat viral illness. Taking antibiotics when they are not needed increases your risk of getting an infection later that is antibiotic-resistant.Your provider will prescribe antibiotics if the infection is caused by bacteria. If they are prescribed:   Take the medicine until it is used up, even if symptoms have improved. If you don't, the bronchitis may come back.   Take them as directed. For instance, some medicines should be taken with food.   Ask your provider or pharmacist what side effects are common, and what to do about them.  Follow-up care  You shouldsee your provider again in 2   to 3 weeks. By this time, symptoms should have improved. An infection that lasts longer may mean you havea more serious problem.  Prevention   Avoid tobacco smoke. If you smoke, quit. Stay away from smoky places. Ask friends and family not to smoke around you, or in your home or car.   Get checked forallergies.   Ask your provider about getting a yearly flu shot, and pneumococcal or pneumonia shots.   Wash your hands often. This helps reduce the chance of picking up viruses that cause colds and flu.  Call  your healthcare provider if:   Symptoms worsen, or new symptoms develop.   Breathing problems worsenor become severe.   Symptoms don't get better within a week, or within 3days of taking antibiotics.   Date Last Reviewed: 08/17/2012   2000-2016 [employer] [address], [place] [address]. All rights reserved. This information is not intended as a substitute for professional medical care. Always follow your healthcare professional's instructions.

## 2015-05-08 NOTE — Progress Notes (Signed)
Subjective:    Patient ID: Regina Adams is a 30 y.o. female.    HPI Comments: ALBUTEROL TREATMENT DONE LESS THAN ONE HOUR AGO    Cough  This is a new problem. Episode onset: 4 DAYS. The problem has been unchanged. The cough is non-productive. Associated symptoms include postnasal drip, rhinorrhea and shortness of breath. Pertinent negatives include no chest pain, chills, ear congestion, ear pain, fever, headaches, heartburn, hemoptysis, myalgias, nasal congestion, rash, sore throat, sweats, weight loss or wheezing. Nothing aggravates the symptoms. She has tried a beta-agonist inhaler for the symptoms. The treatment provided no relief. Her past medical history is significant for bronchitis. There is no history of asthma, bronchiectasis, COPD, emphysema, environmental allergies or pneumonia.       The following portions of the patient's history were reviewed and updated as appropriate: allergies, current medications, past family history, past medical history, past social history, past surgical history and problem list.    Review of Systems   Constitutional: Negative for fever, chills, weight loss, diaphoresis, activity change, appetite change, fatigue and unexpected weight change.   HENT: Positive for congestion, postnasal drip, rhinorrhea and sinus pressure. Negative for ear pain and sore throat.    Respiratory: Positive for cough, chest tightness and shortness of breath. Negative for apnea, hemoptysis, wheezing and stridor.    Cardiovascular: Negative for chest pain, palpitations and leg swelling.   Gastrointestinal: Negative for heartburn, nausea, vomiting and abdominal pain.   Musculoskeletal: Negative for myalgias.   Skin: Negative for rash.   Allergic/Immunologic: Negative for environmental allergies.   Neurological: Negative for headaches.         Objective:    BP 131/79 mmHg  Pulse 100  Temp(Src) 98.5 F (36.9 C) (Oral)  Resp 18  Ht 1.6 m (5\' 3" )  Wt 57.153 kg (126 lb)  BMI 22.33 kg/m2  SpO2  99%  LMP 04/11/2015    Physical Exam   Constitutional: She is oriented to person, place, and time. She appears well-developed and well-nourished. No distress.   HENT:   Head: Normocephalic and atraumatic.   Right Ear: External ear normal.   Left Ear: External ear normal.   Mouth/Throat: Oropharynx is clear and moist. No oropharyngeal exudate.   Eyes: Conjunctivae and EOM are normal. Pupils are equal, round, and reactive to light. Right eye exhibits no discharge. Left eye exhibits no discharge.   Neck: Normal range of motion. Neck supple.   Cardiovascular: Normal rate, regular rhythm and normal heart sounds.    Pulmonary/Chest: Effort normal and breath sounds normal. No respiratory distress. She has no wheezes. She has no rales.   DECREASE BREATH SOUND B/L   Lymphadenopathy:     She has no cervical adenopathy.   Neurological: She is alert and oriented to person, place, and time.   Skin: Skin is warm. No rash noted. She is not diaphoretic.   Psychiatric: She has a normal mood and affect.         Assessment and Plan:       Katoya was seen today for chest congestion.    Diagnoses and all orders for this visit:    SOB (shortness of breath)  -     XR Chest 2 Views        LMP-- 04/13/2015, DENIES BEING PREGNANT  Discussed X ray with pt during visit.  denies bleeding ulcers,so prescribed prednisone. Side effects discussed.  Advised plenty of liquids,tylenol/ibuprofen PRN for pain/fever.F/U with PCP/urgent care soon if symptoms persists/gets worse more  than 2-3 days. If symptoms continues need CXR,labs.  Advise to watch for worsening symptoms,with those seek medical help immediatly  F/u with PCP 5-7 days  F/u PRN    Etheleen Sia, MD  [facility]  05/08/2015  2:39 PM

## 2015-05-11 ENCOUNTER — Telehealth (INDEPENDENT_AMBULATORY_CARE_PROVIDER_SITE_OTHER): Payer: Self-pay

## 2015-05-11 NOTE — Telephone Encounter (Signed)
Called patient regarding health status following visit to [facility]. Left message to call back with any concerns or questions.

## 2015-06-28 ENCOUNTER — Ambulatory Visit (INDEPENDENT_AMBULATORY_CARE_PROVIDER_SITE_OTHER): Payer: MEDICAID | Admitting: OBSTETRICS/GYNECOLOGY

## 2015-07-05 ENCOUNTER — Ambulatory Visit (INDEPENDENT_AMBULATORY_CARE_PROVIDER_SITE_OTHER): Payer: MEDICAID | Admitting: OBSTETRICS/GYNECOLOGY

## 2015-07-05 ENCOUNTER — Encounter (INDEPENDENT_AMBULATORY_CARE_PROVIDER_SITE_OTHER): Payer: Self-pay | Admitting: OBSTETRICS/GYNECOLOGY

## 2015-07-05 VITALS — BP 108/70 | HR 98 | Temp 98.0°F | Ht 63.0 in | Wt 125.5 lb

## 2015-07-05 DIAGNOSIS — E278 Other specified disorders of adrenal gland: Secondary | ICD-10-CM

## 2015-07-05 DIAGNOSIS — E281 Androgen excess: Secondary | ICD-10-CM

## 2015-07-05 DIAGNOSIS — L659 Nonscarring hair loss, unspecified: Secondary | ICD-10-CM

## 2015-07-05 DIAGNOSIS — L680 Hirsutism: Secondary | ICD-10-CM

## 2015-07-05 DIAGNOSIS — R519 Headache, unspecified: Secondary | ICD-10-CM

## 2015-07-05 DIAGNOSIS — R5383 Other fatigue: Secondary | ICD-10-CM

## 2015-07-05 DIAGNOSIS — R51 Headache: Secondary | ICD-10-CM

## 2015-07-05 DIAGNOSIS — R7989 Other specified abnormal findings of blood chemistry: Secondary | ICD-10-CM

## 2015-07-05 MED ORDER — SPIRONOLACTONE 50 MG TABLET
50.00 mg | ORAL_TABLET | Freq: Two times a day (BID) | ORAL | 4 refills | Status: DC
Start: 2015-07-05 — End: 2015-07-24

## 2015-07-05 NOTE — Progress Notes (Signed)
[facility]  [address]  [place], [place]  [address]  [phone]        Name of Patient: Lindsay Mccullough  DOB: 1985/04/26  MRN: [id number]    Date of Service: 07/05/2015        Subjective:    30 y.o. [id number]     Chief Complaint   Patient presents with    FOLLOW UP MED CHECK     birth control     Doing well with OCP. She is compliant and taking same time daily.   Cycles are regular a light.   She still is complaining of this hair, under chin on neck. She is shaving several times per week now. Would like to start spirolactone discussed at last visit.     REVIEW OF SYSTEMS :    Constitutional: Negative. Denies any fever, chills or fatigue   Gastrointestinal: Negative.  Denies any change in bowel habits, nausea or vomiting  Genitourinary:        See HPI       Patient's PMH/PSH/FH/SH/Meds/Allergies were all reviewed and noted in the EPIC chart on today's visit.    Past Medical History:   Diagnosis Date    Asthma     Dysplasia of cervix     H/O seasonal allergies          Past Surgical History:   Procedure Laterality Date    HX DILATION AND CURETTAGE      HX NO SURGICAL PROCEDURES           Family History   Problem Relation Age of Onset    Congestive Heart Failure Mother     Hypertension Mother     High Cholesterol Mother     Heart Attack Mother     Hearing Loss Mother     Heart Disease Mother     Cirrhosis Father     Hypertension Father     High Cholesterol Father     Hypertension Brother     Diabetes Paternal Grandmother     Colon Cancer Other     Pancreatic Cancer Neg Hx     Ovarian Cancer Neg Hx     Osteoporosis Neg Hx     Rectal Cancer Neg Hx     Uterine Fibroids Neg Hx     Uterine Cancer Neg Hx     Clotting Disorder Neg Hx     Breast Cancer Neg Hx     Cervical Cancer Neg Hx          Current Outpatient Prescriptions   Medication Sig Dispense Refill    fluticasone (FLONASE) 50 mcg/actuation Nasal Spray, Suspension 1 Spray by Each Nostril route Once a  day      MULTIVITS,CA,MINERALS/IRON/FA (ONE-A-DAY WOMENS FORMULA ORAL) Take by mouth      Norgestimate-Ethinyl Estradiol (SPRINTEC, 28,) 0.25-35 mg-mcg Oral Tablet Take 1 Tab by mouth Once a day 28 Tab 11    sertraline (ZOLOFT) 50 mg Oral Tablet Take 50 mg by mouth Once a day      spironolactone (ALDACTONE) 50 mg Oral Tablet Take 1 Tab (50 mg total) by mouth Twice daily 60 Tab 4     No current facility-administered medications for this visit.      Allergies   Allergen Reactions    Amoxicillin Anaphylaxis    Iodine      Blisters on left arm    Penicillins Hives/ Urticaria     Social History     Social  History    Marital status: Divorced     Spouse name: N/A    Number of children: N/A    Years of education: N/A     Occupational History    Not on file.     Social History Main Topics    Smoking status: Former Smoker    Smokeless tobacco: Never Used    Alcohol use 0.0 oz/week     0 Standard drinks or equivalent per week    Drug use: Not on file    Sexual activity: Not on file     Other Topics Concern    Breast Self Exam No     Social History Narrative    No narrative on file           Objective:  BP 108/70   Pulse 98   Temp 36.7 C (98 F) (Oral)    Ht 1.6 m (5\' 3" )   Wt 56.9 kg (125 lb 8 oz)   LMP 07/04/2015   BMI 22.23 kg/m2  Physical Exam:    Constitutional: She is oriented. She appears well-developed and well-nourished.     HENT: Head: Normocephalic.  [place] 4     Abdomen: She exhibits no distension. Soft. No tenderness. She has no rebound and no guarding.     Psychiatric: She has a normal mood and affect. Her behavior is normal.     Extremities:  No edema    Skin:  normal        Assessment & Plan:     ICD-10-CM    1. 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 Fatigue, unspecified type R53.83 FERRITIN     TRANSFERRIN SATURATION PANEL   3. Hair  loss L65.9 FERRITIN     TRANSFERRIN SATURATION PANEL   4. 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 Elevated dehydroepiandrosterone (DHEA) level (HCC) E27.8 DEHYDROEPIANDROSTERONE SULFATE (DHEA-S), SERUM       Spirolactone ordered. R/B and SE reviewed.   Labs ordered   Recommend iron levels also.     Follow-up in 6 weeks   Will review results at that time.     Greater that 50% of a 15 minute appointment was spent in consultation.         Orders Placed This Encounter    ACTH (CORTROSYN) STIMULATION TEST    17-HYDROXYPROGESTERONE, SERUM    FERRITIN    TRANSFERRIN SATURATION PANEL    DEHYDROEPIANDROSTERONE SULFATE (DHEA-S), SERUM    spironolactone (ALDACTONE) 50 mg Oral Tablet

## 2015-07-08 ENCOUNTER — Ambulatory Visit (HOSPITAL_BASED_OUTPATIENT_CLINIC_OR_DEPARTMENT_OTHER): Payer: MEDICAID | Attending: OBSTETRICS/GYNECOLOGY

## 2015-07-08 DIAGNOSIS — L659 Nonscarring hair loss, unspecified: Secondary | ICD-10-CM | POA: Insufficient documentation

## 2015-07-08 DIAGNOSIS — R5383 Other fatigue: Secondary | ICD-10-CM | POA: Insufficient documentation

## 2015-07-08 DIAGNOSIS — L680 Hirsutism: Secondary | ICD-10-CM | POA: Insufficient documentation

## 2015-07-08 DIAGNOSIS — R7989 Other specified abnormal findings of blood chemistry: Secondary | ICD-10-CM

## 2015-07-08 DIAGNOSIS — R519 Headache, unspecified: Secondary | ICD-10-CM

## 2015-07-08 DIAGNOSIS — R51 Headache: Secondary | ICD-10-CM | POA: Insufficient documentation

## 2015-07-08 LAB — FERRITIN
FERRITIN: 27 ng/mL (ref 11–307)
FERRITIN: 27 ng/mL (ref 11–307)

## 2015-07-08 LAB — TRANSFERRIN SATURATION PANEL
IRON (TRANSFERRIN) SATURATION: 13 % — ABNORMAL LOW (ref 15–50)
IRON: 59 ug/dL (ref 28–170)
TOTAL IRON BINDING CAPACITY: 453 ug/dL
TRANSFERRIN: 317 mg/dL — ABNORMAL HIGH (ref 192–282)

## 2015-07-08 LAB — ACTH STIMULATION, 0 MIN CORTISOL: ACTH STIM CORTISOL 0 MIN: 16.7 ug/dL

## 2015-07-09 LAB — DEHYDROEPIANDROSTERONE SULFATE (DHEA-S), SERUM: DEHYDROEPIANDROSTERONE SULFATE, S: 128 ug/dL (ref 31–228)

## 2015-07-11 LAB — 17-HYDROXYPROGESTERONE, SERUM: 17-HYDROXYPROGESTERONE, SERUM: <40 ng/dL

## 2015-07-15 ENCOUNTER — Ambulatory Visit (INDEPENDENT_AMBULATORY_CARE_PROVIDER_SITE_OTHER): Payer: Medicaid (Managed Care) | Admitting: Family

## 2015-07-15 ENCOUNTER — Encounter (INDEPENDENT_AMBULATORY_CARE_PROVIDER_SITE_OTHER): Payer: Self-pay | Admitting: Family

## 2015-07-15 VITALS — BP 107/81 | HR 91 | Temp 98.4°F | Resp 20 | Ht 63.0 in | Wt 122.3 lb

## 2015-07-15 DIAGNOSIS — R05 Cough: Secondary | ICD-10-CM

## 2015-07-15 DIAGNOSIS — H65191 Other acute nonsuppurative otitis media, right ear: Secondary | ICD-10-CM

## 2015-07-15 DIAGNOSIS — J020 Streptococcal pharyngitis: Secondary | ICD-10-CM

## 2015-07-15 DIAGNOSIS — J029 Acute pharyngitis, unspecified: Secondary | ICD-10-CM

## 2015-07-15 DIAGNOSIS — R059 Cough, unspecified: Secondary | ICD-10-CM

## 2015-07-15 DIAGNOSIS — R0981 Nasal congestion: Secondary | ICD-10-CM

## 2015-07-15 LAB — POCT RAPID STREP A: Rapid Strep A Screen POCT: POSITIVE — AB

## 2015-07-15 MED ORDER — AZITHROMYCIN 250 MG PO TABS
ORAL_TABLET | ORAL | Status: DC
Start: 2015-07-15 — End: 2015-07-24

## 2015-07-15 MED ORDER — FLUTICASONE PROPIONATE 50 MCG/ACT NA SUSP
1.0000 | Freq: Every day | NASAL | Status: AC
Start: 2015-07-15 — End: 2015-08-14

## 2015-07-15 MED ORDER — BENZONATATE 100 MG PO CAPS
100.0000 mg | ORAL_CAPSULE | Freq: Three times a day (TID) | ORAL | Status: DC | PRN
Start: 2015-07-15 — End: 2015-07-24

## 2015-07-15 NOTE — Progress Notes (Signed)
Subjective:    Patient ID: Regina Adams is a 30 y.o. female.    URI   This is a new problem. The current episode started in the past 7 days (4 days). The problem has been unchanged. There has been no fever. Associated symptoms include congestion, coughing, ear pain and a sore throat. She has tried acetaminophen for the symptoms. The treatment provided mild relief.       The following portions of the patient's history were reviewed and updated as appropriate: allergies, current medications, past family history, past medical history, past social history, past surgical history and problem list.    Review of Systems   HENT: Positive for congestion, ear pain and sore throat.    Respiratory: Positive for cough.    All other systems reviewed and are negative.        Objective:    BP 107/81 mmHg  Pulse 91  Temp(Src) 98.4 F (36.9 C) (Oral)  Resp 20  Ht 1.6 m (5\' 3" )  Wt 55.475 kg (122 lb 4.8 oz)  BMI 21.67 kg/m2  LMP 07/04/2015 (Exact Date)    Physical Exam   Constitutional: She is oriented to person, place, and time. Vital signs are normal. She appears well-developed and well-nourished. No distress.   HENT:   Head: Normocephalic and atraumatic.   Right Ear: Hearing, external ear and ear canal normal.   Left Ear: Hearing, tympanic membrane, external ear and ear canal normal.   Ears:    Nose: Nose normal.   Mouth/Throat: Uvula is midline and mucous membranes are normal. Posterior oropharyngeal erythema present.   Eyes: Pupils are equal, round, and reactive to light.   Neck: Neck supple.   Cardiovascular: Normal rate, regular rhythm and normal heart sounds.    No murmur heard.  Pulmonary/Chest: Effort normal and breath sounds normal. No respiratory distress.   Neurological: She is alert and oriented to person, place, and time. GCS eye subscore is 4. GCS verbal subscore is 5. GCS motor subscore is 6.   Skin: Skin is warm and dry. She is not diaphoretic.   Nursing note and vitals reviewed.     Labs  Recent  Results (from the past 24 hour(s))   POCT RAPID STREP A    Collection Time: 07/15/15 12:52 PM   Result Value Ref Range    POCT QC Pass     Rapid Strep A Screen POCT Positive (A) Negative    Comment       Negative Results should be confirmed by throat Cx to confirm absence of Strep A inf.       Radiology  No results found.        Assessment and Plan:       Sherilyn was seen today for sore throat, otalgia and cough.    Diagnoses and all orders for this visit:    Pharyngitis, unspecified etiology  -     POCT RAPID STREP A    Cough  -     benzonatate (TESSALON PERLES) 100 MG capsule; Take 1 capsule (100 mg total) by mouth 3 (three) times daily as needed.    Nasal congestion  -     fluticasone (FLONASE) 50 MCG/ACT nasal spray; 1 spray by Nasal route daily.    Other acute nonsuppurative otitis media of right ear, recurrence not specified  -     azithromycin (ZITHROMAX Z-PAK) 250 MG tablet; First day take 2 tablets then 1 tab for 4 days    Streptococcal sore  throat  -     azithromycin (ZITHROMAX Z-PAK) 250 MG tablet; First day take 2 tablets then 1 tab for 4 days          Take  your medication  Positive for strep  First 24 hours on antibiotics you are contagious to others    Increase fluid intake  Antipyretics for any pain or fever  -Follow up with PCP if symptoms lasting longer than expected  -RTC or ER if new/worsening symptoms  -Pt/guardian agreed to plan    Etter Sjogren, NP  [facility]  07/15/2015  1:16 PM

## 2015-07-15 NOTE — Patient Instructions (Signed)
Take all of your medication  Increase fluid intake  Antipyretics for any pain or fever  -Follow up with PCP if symptoms lasting longer than expected  -RTC or ER if new/worsening symptoms  -Pt/guardian agreed to plan  First 24 hours on antibiotics you are contagious to others    Middle Ear Infection (Adult)  You have an infection of the middle ear, the space behind the eardrum. This is also called acute otitis media (AOM). Sometimes it is caused by the common cold. This is because congestion can block the internal passage (eustachian tube) that drains fluid from the middle ear. When the middle ear fills with fluid, bacteria can grow there and cause an infection. Oral antibiotics are used to treat this illness, not ear drops. Symptoms usually start to improve within 1 to 2 days of treatment.    Home care  The following are general care guidelines:   Finish all of the antibiotic medicine given, even though you may feel better after the first few days.   You may use over-the-counter medicine, such as acetaminophen or ibuprofen, to control pain and fever, unless something else was prescribed. If you have chronic liver or kidney disease or have ever had a stomach ulcer or gastrointestinal bleeding, talk with your healthcare provider before using these medicines. Do not give aspirin to anyone under 60 years of age who has a fever. It may cause severe illness or death.  Follow-up care  Follow up with your healthcare provider, or as advised, in 2 weeks if all symptoms have not gotten better, or if hearing doesn't go back to normal within 1 month.  When to seek medical advice  Call your healthcare provider right away if any of these occur:   Ear pain gets worse or does not improve after 3 days of treatment   Unusual drowsiness or confusion   Neck pain, stiff neck, or headache   Fluid or blood draining from the ear canal   Fever of 100.10F (38C) or as advised   Seizure  Date Last Reviewed: 08/01/2014   2000-2016 [employer] [address], [place], [place] [address]. All rights reserved. This information is not intended as a substitute for professional medical care. Always follow your healthcare professional's instructions.        Pharyngitis: Strep (Confirmed)      You have had a positive test for strep throat. Strep throat is a contagious illness. It is spread by coughing, kissing or by touching others after touching your mouth or nose. Symptoms include throat pain which is worse with swallowing, aching all over, headache and fever. It is treated with antibiotic medication. This should help you start to feel better within 1-2 days.  Home care   Rest at home. Drink plenty of fluids to avoid dehydration.   No work or school for the first 2 days of taking the antibiotics. After this time, you will not be contagious. You can then return to school or work if you are feeling better.   The antibiotic medication must be taken for the full 10 days, even if you feel better. This is very important to ensure the infection is treated.It is also important to prevent drug-resistent organisms from developing.If you were given an antibiotic shot, no more antibiotics are needed.   You may use acetaminophen (Tylenol) or ibuprofen (Motrin, Advil) to control pain or fever, unless another medicine was prescribed for this. (NOTE: If you have chronic liver or kidney disease or  ever had a stomach ulcer or GI bleeding, talk with your doctor before using these medicines.)   Throat lozenges or sprays (such as Chloraseptic) help reduce pain. Gargling with warm salt water will also reduce throat pain. Dissolve 1/2 teaspoon of salt in 1 glass of warm water. This may be useful just before meals.   Soft foods are okay. Avoid salty or spicy foods.  Follow-up care  Follow up with your healthcare provider or our staff if you are not improving over the next week.  When to seek medical advice  Call your healthcare provider right away if  any of these occur:   Feveras directed by your doctor   New or worsening ear pain, sinus pain, or headache   Painful lumps in the back of neck   Stiff neck   Lymph nodes are getting larger or becoming soft in the middle   Inability to swallow liquids, excessive drooling,or inability to open mouth wide due to throat pain   Signs of dehydration (very dark urine or no urine, sunken eyes, dizziness)   Trouble breathing or noisy breathing   Muffled voice   New rash  Date Last Reviewed: 06/12/2013   2000-2016 [employer] [address] [place], [place] [address]. All rights reserved. This information is not intended as a substitute for professional medical care. Always follow your healthcare professional's instructions.

## 2015-07-18 ENCOUNTER — Telehealth (INDEPENDENT_AMBULATORY_CARE_PROVIDER_SITE_OTHER): Payer: Self-pay

## 2015-07-18 NOTE — Telephone Encounter (Signed)
Courtesy call attempted. Left patient a voice mail with our number to call with any questions or concerns.

## 2015-07-24 ENCOUNTER — Ambulatory Visit (INDEPENDENT_AMBULATORY_CARE_PROVIDER_SITE_OTHER): Payer: Medicaid (Managed Care) | Admitting: Family Medicine

## 2015-07-24 ENCOUNTER — Ambulatory Visit (INDEPENDENT_AMBULATORY_CARE_PROVIDER_SITE_OTHER): Payer: Medicaid (Managed Care) | Attending: Family Medicine

## 2015-07-24 ENCOUNTER — Encounter (INDEPENDENT_AMBULATORY_CARE_PROVIDER_SITE_OTHER): Payer: Self-pay | Admitting: Family Medicine

## 2015-07-24 ENCOUNTER — Encounter (HOSPITAL_BASED_OUTPATIENT_CLINIC_OR_DEPARTMENT_OTHER): Payer: Self-pay

## 2015-07-24 ENCOUNTER — Emergency Department (HOSPITAL_BASED_OUTPATIENT_CLINIC_OR_DEPARTMENT_OTHER): Payer: MEDICAID

## 2015-07-24 ENCOUNTER — Observation Stay (HOSPITAL_BASED_OUTPATIENT_CLINIC_OR_DEPARTMENT_OTHER)
Admission: EM | Admit: 2015-07-24 | Discharge: 2015-07-25 | Disposition: A | Discharge status: Home / Self Care | Payer: MEDICAID | Attending: Internal Medicine | Admitting: Internal Medicine

## 2015-07-24 VITALS — BP 114/75 | HR 84 | Temp 98.8°F | Resp 18 | Ht 63.0 in | Wt 125.3 lb

## 2015-07-24 DIAGNOSIS — R0602 Shortness of breath: Secondary | ICD-10-CM | POA: Insufficient documentation

## 2015-07-24 DIAGNOSIS — Z87891 Personal history of nicotine dependence: Secondary | ICD-10-CM | POA: Insufficient documentation

## 2015-07-24 DIAGNOSIS — J45909 Unspecified asthma, uncomplicated: Secondary | ICD-10-CM | POA: Insufficient documentation

## 2015-07-24 DIAGNOSIS — Z91041 Radiographic dye allergy status: Secondary | ICD-10-CM | POA: Insufficient documentation

## 2015-07-24 DIAGNOSIS — R079 Chest pain, unspecified: Secondary | ICD-10-CM | POA: Diagnosis present

## 2015-07-24 DIAGNOSIS — F17210 Nicotine dependence, cigarettes, uncomplicated: Secondary | ICD-10-CM | POA: Insufficient documentation

## 2015-07-24 DIAGNOSIS — Z880 Allergy status to penicillin: Secondary | ICD-10-CM | POA: Insufficient documentation

## 2015-07-24 DIAGNOSIS — I4510 Unspecified right bundle-branch block: Secondary | ICD-10-CM

## 2015-07-24 DIAGNOSIS — R7989 Other specified abnormal findings of blood chemistry: Secondary | ICD-10-CM | POA: Diagnosis present

## 2015-07-24 LAB — COMPREHENSIVE METABOLIC PROFILE - BMC/JMC ONLY
ALBUMIN/GLOBULIN RATIO: 1.4 (ref 0.8–2.0)
ALBUMIN: 4.1 g/dL (ref 3.5–5.0)
ALKALINE PHOSPHATASE: 49 U/L (ref 38–126)
ALT (SGPT): 11 U/L — ABNORMAL LOW (ref 14–54)
ANION GAP: 10 mmol/L (ref 3–11)
AST (SGOT): 15 U/L (ref 15–41)
BILIRUBIN TOTAL: 0.5 mg/dL (ref 0.3–1.2)
BUN/CREA RATIO: 14 (ref 6–22)
BUN: 8 mg/dL (ref 6–20)
CALCIUM: 9.8 mg/dL (ref 8.6–10.3)
CHLORIDE: 104 mmol/L (ref 101–111)
CO2 TOTAL: 25 mmol/L (ref 22–32)
CREATININE: 0.59 mg/dL (ref 0.44–1.00)
ESTIMATED GFR: >60 mL/min/1.73mˆ2 (ref >60)
GLUCOSE: 100 mg/dL (ref 70–110)
POTASSIUM: 5.3 mmol/L — ABNORMAL HIGH (ref 3.4–5.1)
PROTEIN TOTAL: 7.1 g/dL (ref 6.4–8.3)
SODIUM: 139 mmol/L (ref 136–145)

## 2015-07-24 LAB — CBC WITH DIFF
BASOPHIL #: 0 x10ˆ3/uL (ref 0.00–0.10)
BASOPHIL %: 0 % (ref 0–3)
EOSINOPHIL #: 0 x10ˆ3/uL (ref 0.00–0.50)
EOSINOPHIL %: 0 % (ref 0–5)
HCT: 39.5 % (ref 36.0–45.0)
HGB: 13 g/dL (ref 12.0–15.5)
LYMPHOCYTE #: 1.8 x10ˆ3/uL (ref 1.00–4.80)
LYMPHOCYTE %: 36 % (ref 15–43)
MCH: 32.6 pg (ref 27.5–33.2)
MCHC: 32.8 g/dL (ref 32.0–36.0)
MCV: 99.2 fL — ABNORMAL HIGH (ref 82.0–97.0)
MONOCYTE #: 0.2 x10ˆ3/uL (ref 0.20–0.90)
MONOCYTE %: 5 % (ref 5–12)
MPV: 7.5 fL (ref 7.4–10.5)
NEUTROPHIL #: 3 x10ˆ3/uL (ref 1.50–6.50)
NEUTROPHIL %: 59 % (ref 43–76)
PLATELETS: 216 x10ˆ3/uL (ref 150–450)
RBC: 3.98 x10ˆ6/uL — ABNORMAL LOW (ref 4.00–5.10)
RDW: 12.5 % (ref 11.0–16.0)
WBC: 5.1 x10ˆ3/uL (ref 4.0–11.0)

## 2015-07-24 LAB — BLUE TOP TUBE

## 2015-07-24 LAB — TROPONIN-I
TROPONIN I: <0.03 ng/mL (ref <=0.06)
TROPONIN I: <0.03 ng/mL (ref <=0.06)
TROPONIN I: <0.03 ng/mL (ref <=0.06)

## 2015-07-24 LAB — RED TOP TUBE

## 2015-07-24 LAB — HCG, SERUM QUALITATIVE, PREGNANCY: HCG, QUALITATIVE SERUM: NEGATIVE

## 2015-07-24 LAB — D-DIMER: D-DIMER: 845 ng/mL — ABNORMAL HIGH (ref <=230)

## 2015-07-24 LAB — THYROID STIMULATING HORMONE WITH FREE T4 REFLEX: TSH: 0.382 u[IU]/mL (ref 0.340–5.330)

## 2015-07-24 LAB — PT/INR
INR: 1.01
PROTHROMBIN TIME: 11.1 s (ref 9.4–12.5)

## 2015-07-24 MED ORDER — SODIUM CHLORIDE 0.9 % (FLUSH) INJECTION SYRINGE
10.0000 mL | INJECTION | Freq: Three times a day (TID) | INTRAMUSCULAR | Status: DC
Start: 2015-07-24 — End: 2015-07-25
  Administered 2015-07-24: 10 mL via INTRAVENOUS
  Administered 2015-07-25: 0 mL via INTRAVENOUS

## 2015-07-24 MED ORDER — NITROGLYCERIN 0.4 MG SUBLINGUAL TABLET
0.40 mg | SUBLINGUAL_TABLET | SUBLINGUAL | Status: DC | PRN
Start: 2015-07-24 — End: 2015-07-25

## 2015-07-24 MED ORDER — ENOXAPARIN 60 MG/0.6 ML SUB-Q SYRINGE - EAST
60.00 mg | INJECTION | SUBCUTANEOUS | Status: AC
Start: 2015-07-24 — End: 2015-07-24
  Administered 2015-07-24: 60 mg via SUBCUTANEOUS
  Filled 2015-07-24: qty 0.6

## 2015-07-24 MED ORDER — HYDROMORPHONE 1 MG/ML INJECTION WRAPPER
1.00 mg | INJECTION | INTRAMUSCULAR | Status: DC | PRN
Start: 2015-07-24 — End: 2015-07-25
  Administered 2015-07-25: 1 mg via INTRAVENOUS
  Filled 2015-07-24: qty 1

## 2015-07-24 MED ORDER — PREDNISONE 20 MG TABLET
50.00 mg | ORAL_TABLET | Freq: Four times a day (QID) | ORAL | Status: AC
Start: 2015-07-24 — End: 2015-07-25
  Administered 2015-07-24 – 2015-07-25 (×3): 50 mg via ORAL
  Filled 2015-07-24 (×2): qty 2
  Filled 2015-07-24 (×2): qty 1
  Filled 2015-07-24: qty 2
  Filled 2015-07-24: qty 1

## 2015-07-24 MED ORDER — GI COCKTAIL (ANTACID SUSP, HYOSCYAMINE, LIDOCAINE)
55.00 mL | ORAL | Status: AC
Start: 2015-07-24 — End: 2015-07-24
  Administered 2015-07-24: 55 mL via ORAL
  Filled 2015-07-24: qty 55

## 2015-07-24 MED ORDER — SODIUM CHLORIDE 0.9 % (FLUSH) INJECTION SYRINGE
10.00 mL | INJECTION | Freq: Three times a day (TID) | INTRAMUSCULAR | Status: DC
Start: 2015-07-24 — End: 2015-07-24

## 2015-07-24 MED ORDER — MORPHINE 4 MG/ML INTRAVENOUS CARTRIDGE
4.0000 mg | CARTRIDGE | INTRAVENOUS | Status: DC | PRN
Start: 2015-07-24 — End: 2015-07-25

## 2015-07-24 MED ORDER — HYDROMORPHONE 1 MG/ML INJECTION WRAPPER
1.00 mg | INJECTION | INTRAMUSCULAR | Status: DC | PRN
Start: 2015-07-24 — End: 2015-07-24
  Administered 2015-07-24: 1 mg via INTRAVENOUS
  Filled 2015-07-24: qty 1

## 2015-07-24 MED ORDER — DIPHENHYDRAMINE 50 MG CAPSULE
50.0000 mg | ORAL_CAPSULE | Freq: Once | ORAL | Status: AC
Start: 2015-07-25 — End: 2015-07-25
  Administered 2015-07-25: 50 mg via ORAL
  Filled 2015-07-24: qty 1

## 2015-07-24 MED ORDER — LORAZEPAM 2 MG/ML INJECTION SOLUTION
0.5000 mg | INTRAMUSCULAR | Status: DC | PRN
Start: 2015-07-24 — End: 2015-07-25
  Administered 2015-07-24: 0.5 mg via INTRAVENOUS
  Filled 2015-07-24: qty 1

## 2015-07-24 MED ORDER — SODIUM CHLORIDE 0.9 % INTRAVENOUS SOLUTION
INTRAVENOUS | Status: DC
Start: 2015-07-24 — End: 2015-07-25

## 2015-07-24 MED ORDER — SODIUM CHLORIDE 0.9 % (FLUSH) INJECTION SYRINGE
10.0000 mL | INJECTION | INTRAMUSCULAR | Status: DC | PRN
Start: 2015-07-24 — End: 2015-07-25

## 2015-07-24 MED ORDER — ENOXAPARIN 40 MG/0.4 ML SUB-Q SYRINGE - EAST
40.00 mg | INJECTION | Freq: Two times a day (BID) | SUBCUTANEOUS | Status: DC
Start: 2015-07-25 — End: 2015-07-25
  Administered 2015-07-25: 40 mg via SUBCUTANEOUS
  Filled 2015-07-24: qty 0

## 2015-07-24 MED ORDER — ACETAMINOPHEN 325 MG TABLET
650.0000 mg | ORAL_TABLET | Freq: Four times a day (QID) | ORAL | Status: DC | PRN
Start: 2015-07-24 — End: 2015-07-25

## 2015-07-24 MED ORDER — ONDANSETRON HCL (PF) 4 MG/2 ML INJECTION SOLUTION
4.00 mg | INTRAMUSCULAR | Status: AC
Start: 2015-07-24 — End: 2015-07-24
  Administered 2015-07-24: 4 mg via INTRAVENOUS
  Filled 2015-07-24: qty 2

## 2015-07-24 MED ORDER — SERTRALINE 50 MG TABLET
50.0000 mg | ORAL_TABLET | Freq: Every day | ORAL | Status: DC
Start: 2015-07-25 — End: 2015-07-25
  Administered 2015-07-25: 50 mg via ORAL
  Filled 2015-07-24: qty 1

## 2015-07-24 NOTE — ED Nurses Note (Signed)
Pt also c/o chest pain that radiates to back.  [facility]

## 2015-07-24 NOTE — ED Triage Notes (Signed)
Pt sent from [facility] for c/o SOB [facility] [employer] [facility] [employer] [facility]

## 2015-07-24 NOTE — H&P (Signed)
[facility]  General Medicine  Admission H&P    Date of Service:  07/24/2015  Lindsay PeaksCloud,Lindsay Mccullough, 30 y.o. female  Date of Admission:  07/24/2015  Date of Birth:  08/11/1985  PCP: No Established Pcp    Information Obtained from: patient  Chief Complaint:  Chest pain    HPI: Lindsay PeaksJessica Mccullough is a 30 y.o., White female who presents with chest pain that began yesterday after to eating at [employer] [facility] [employer] [employer].  Discomfort progressively became worse.  She was able to tolerated overnight and attempted to work today but the pain continued to progress.  She states that has become associated with some shortness of breath for which she tried her home inhaler and noted no improvement.  Pain is pleuritic in nature is substernal and radiates through to the back between the shoulder blades.  She has had no history of previous pain such as this.  She was seen at urgent care and was sent to the emergency room after having a normal EKG and normal chest x-ray for concern for pulmonary embolus.  On arrival in the emergency room her oxygen saturation 97 percent on room air and she was not tachycardic.  She does smoke approximately half pack cigarettes per day and is on oral contraceptive.  She has no history of DVT or PE.  Evaluation in the emergency room was unremarkable except for an elevated D-dimer at greater than 800. CT scan of the chest was not performed due to patient's previous reaction to IV contrast for an abdominal CT.  Anticoagulation was begun with Lovenox and the patient will be observed overnight for 13 hour prep for CT pulmonary angio.  On arrival to the floor patient is comfortable when lying in bed and not attempting to take deep breath.      PAST MEDICAL:    Past Medical History:   Diagnosis Date    Asthma     Dysplasia of cervix     H/O seasonal allergies        Past Surgical History:   Procedure Laterality Date    HX DILATION AND CURETTAGE      HX NO SURGICAL PROCEDURES         Medications Prior to Admission      Prescriptions    fluticasone (FLONASE) 50 mcg/actuation Nasal Spray, Suspension    1 Spray by Each Nostril route Once a day    MULTIVITS,CA,MINERALS/IRON/FA (ONE-A-DAY WOMENS FORMULA ORAL)    Take by mouth    Norgestimate-Ethinyl Estradiol (SPRINTEC, 28,) 0.25-35 mg-mcg Oral Tablet    Take 1 Tab by mouth Once a day    sertraline (ZOLOFT) 50 mg Oral Tablet    Take 50 mg by mouth Once a day    spironolactone (ALDACTONE) 50 mg Oral Tablet    Take 1 Tab (50 mg total) by mouth Twice daily        Allergies   Allergen Reactions    Amoxicillin Anaphylaxis    Iodine      Blisters on left arm    Penicillins Hives/ Urticaria     Vaccinations:        Pneumovax: Does not meet criteria to receive.    Influenza: Not current vaccination season.    Family History  Family History   Problem Relation Age of Onset    Congestive Heart Failure Mother     Hypertension Mother     High Cholesterol Mother     Heart Attack Mother     Hearing Loss Mother  Heart Disease Mother     Cirrhosis Father     Hypertension Father     High Cholesterol Father     Hypertension Brother     Diabetes Paternal Grandmother     Colon Cancer Other     Pancreatic Cancer Neg Hx     Ovarian Cancer Neg Hx     Osteoporosis Neg Hx     Rectal Cancer Neg Hx     Uterine Fibroids Neg Hx     Uterine Cancer Neg Hx     Clotting Disorder Neg Hx     Breast Cancer Neg Hx     Cervical Cancer Neg Hx        Social History  Social History     Social History    Marital status: Divorced     Spouse name: N/A    Number of children: N/A    Years of education: N/A     Occupational History    Not on file.     Social History Main Topics    Smoking status: Former Smoker    Smokeless tobacco: Never Used    Alcohol use 0.0 oz/week     0 Standard drinks or equivalent per week    Drug use: Not on file    Sexual activity: Not on file     Other Topics Concern    Breast Self Exam No     Social History Narrative       ROS: Other than ROS in the HPI, all other  systems were negative.    Examination:  Temperature: 37 C (98.6 F)  Heart Rate: 88  BP (Non-Invasive): 111/76  Respiratory Rate: 14  SpO2-1: 100 %  Pain Score (Numeric, Faces): 8  General: appears in good health, pale, mild distress and vital signs reviewed and discussed with patient  Eyes: Conjunctiva clear., Pupils equal and round, reactive to light and accomodation. , Sclera non-icteric.   HENT:ENMT without erythema or injection, mucous membranes moist.  Neck: no thyromegaly or lymphadenopathy  Lungs: Clear to auscultation bilaterally.   Cardiovascular: regular rate and rhythm, S1, S2 normal, no murmur, click, rub or gallop  Abdomen: Soft, non-tender, Bowel sounds normal, non-distended, No hepatosplenomegaly, No masses  Genito-urinary: Deferred  Extremities: No cyanosis or edema, No synovitis or joint effusions  Skin: Skin warm and dry and No rashes  Neurologic: Grossly normal  Lymphatics: No lymphadenopathy  Psychiatric: Normal affect, behavior, memory, thought content, judgement, and speech.    Labs:    I have reviewed all lab results.  Lab Results for Last 24 Hours:    Results for orders placed or performed during the hospital encounter of 07/24/15 (from the past 24 hour(s))   COMPREHENSIVE METABOLIC PROFILE - [facility] ONLY   Result Value Ref Range    SODIUM 139 136 - 145 mmol/L    POTASSIUM 5.3 (H) 3.4 - 5.1 mmol/L    CHLORIDE 104 101 - 111 mmol/L    CO2 TOTAL 25 22 - 32 mmol/L    ANION GAP 10 3 - 11 mmol/L    BUN 8 6 - 20 mg/dL    CREATININE 1.61 0.96 - 1.00 mg/dL    BUN/CREA RATIO 14 6 - 22    ESTIMATED GFR >60 >60 mL/min/1.25m2    ALBUMIN 4.1 3.5 - 5.0 g/dL    CALCIUM 9.8 8.6 - 04.5 mg/dL    GLUCOSE 409 70 - 811 mg/dL    ALKALINE PHOSPHATASE 49 38 - 126 U/L  ALT (SGPT) 11 (L) 14 - 54 U/L    AST (SGOT) 15 15 - 41 U/L    BILIRUBIN TOTAL 0.5 0.3 - 1.2 mg/dL    PROTEIN TOTAL 7.1 6.4 - 8.3 g/dL    ALBUMIN/GLOBULIN RATIO 1.4 0.8 - 2.0   TROPONIN-I   Result Value Ref Range    TROPONIN I <0.03 <=0.06 ng/mL    CBC WITH DIFF   Result Value Ref Range    WBC 5.1 4.0 - 11.0 x103/uL    RBC 3.98 (L) 4.00 - 5.10 x106/uL    HGB 13.0 12.0 - 15.5 g/dL    HCT 16.1 09.6 - 04.5 %    MCV 99.2 (H) 82.0 - 97.0 fL    MCH 32.6 27.5 - 33.2 pg    MCHC 32.8 32.0 - 36.0 g/dL    RDW 40.9 81.1 - 91.4 %    PLATELETS 216 150 - 450 x103/uL    MPV 7.5 7.4 - 10.5 fL    NEUTROPHIL % 59 43 - 76 %    LYMPHOCYTE % 36 15 - 43 %    MONOCYTE % 5 5 - 12 %    EOSINOPHIL % 0 0 - 5 %    BASOPHIL % 0 0 - 3 %    NEUTROPHIL # 3.00 1.50 - 6.50 x103/uL    LYMPHOCYTE # 1.80 1.00 - 4.80 x103/uL    MONOCYTE # 0.20 0.20 - 0.90 x103/uL    EOSINOPHIL # 0.00 0.00 - 0.50 x103/uL    BASOPHIL # 0.00 0.00 - 0.10 x103/uL   BLUE TOP TUBE   Result Value Ref Range    RAINBOW/EXTRA TUBE AUTO RESULT Yes    RED TOP TUBE   Result Value Ref Range    RAINBOW/EXTRA TUBE AUTO RESULT Yes    D-Dimer   Result Value Ref Range    D-DIMER 845 (H) <=230 ng/mL DDU   HCG, SERUM QUALITATIVE, PREGNANCY   Result Value Ref Range    HCG, QUALITATIVE SERUM Negative Negative   PT/INR   Result Value Ref Range    PROTHROMBIN TIME 11.1 9.4 - 12.5 seconds    INR 1.01    THYROID STIMULATING HORMONE WITH FREE T4 REFLEX   Result Value Ref Range    TSH 0.382 0.340 - 5.330 uIU/mL   TROPONIN-I   Result Value Ref Range    TROPONIN I <0.03 <=0.06 ng/mL   TROPONIN-I   Result Value Ref Range    TROPONIN I <0.03 <=0.06 ng/mL   TROPONIN-I   Result Value Ref Range    TROPONIN I <0.03 <=0.06 ng/mL   LIPID PANEL   Result Value Ref Range    CHOLESTEROL 193 120 - 199 mg/dL    HDL CHOL 39 (L) 45 - 65 mg/dL    TRIGLYCERIDES 89 35 - 135 mg/dL    LDL CALC 782 (H) <=956 mg/dL    VLDL CALC 18 5 - 35 mg/dL    CHOL/HDL RATIO 4.9    TROPONIN-I   Result Value Ref Range    TROPONIN I <0.03 <=0.06 ng/mL   COMPREHENSIVE METABOLIC PROFILE - [facility] ONLY   Result Value Ref Range    SODIUM 140 136 - 145 mmol/L    POTASSIUM 5.2 (H) 3.4 - 5.1 mmol/L    CHLORIDE 106 101 - 111 mmol/L    CO2 TOTAL 26 22 - 32 mmol/L    ANION GAP 8 3 -  11 mmol/L    BUN 12 6 - 20 mg/dL  CREATININE 0.63 0.44 - 1.00 mg/dL    BUN/CREA RATIO 19 6 - 22    ESTIMATED GFR >60 >60 mL/min/1.57m2    ALBUMIN 3.8 3.5 - 5.0 g/dL    CALCIUM 9.4 8.6 - 16.1 mg/dL    GLUCOSE 096 (H) 70 - 110 mg/dL    ALKALINE PHOSPHATASE 49 38 - 126 U/L    ALT (SGPT) 10 (L) 14 - 54 U/L    AST (SGOT) 15 15 - 41 U/L    BILIRUBIN TOTAL 0.6 0.3 - 1.2 mg/dL    PROTEIN TOTAL 7.1 6.4 - 8.3 g/dL    ALBUMIN/GLOBULIN RATIO 1.2 0.8 - 2.0   PT/INR   Result Value Ref Range    PROTHROMBIN TIME 11.3 9.4 - 12.5 seconds    INR 1.03    PTT (PARTIAL THROMBOPLASTIN TIME)   Result Value Ref Range    APTT 29.7 25.1 - 36.5 seconds   MAGNESIUM   Result Value Ref Range    MAGNESIUM 1.9 1.4 - 2.1 mg/dL   PHOSPHORUS   Result Value Ref Range    PHOSPHORUS 3.3 2.7 - 4.5 mg/dL   CBC WITH DIFF   Result Value Ref Range    WBC 4.3 4.0 - 11.0 x103/uL    RBC 3.74 (L) 4.00 - 5.10 x106/uL    HGB 12.4 12.0 - 15.5 g/dL    HCT 04.5 40.9 - 81.1 %    MCV 99.4 (H) 82.0 - 97.0 fL    MCH 33.0 27.5 - 33.2 pg    MCHC 33.2 32.0 - 36.0 g/dL    RDW 91.4 78.2 - 95.6 %    PLATELETS 202 150 - 450 x103/uL    MPV 8.2 7.4 - 10.5 fL    NEUTROPHIL % 82 (H) 43 - 76 %    LYMPHOCYTE % 17 15 - 43 %    MONOCYTE % 1 (L) 5 - 12 %    EOSINOPHIL % 0 0 - 5 %    BASOPHIL % 0 0 - 3 %    NEUTROPHIL # 3.50 1.50 - 6.50 x103/uL    LYMPHOCYTE # 0.70 (L) 1.00 - 4.80 x103/uL    MONOCYTE # 0.00 (L) 0.20 - 0.90 x103/uL    EOSINOPHIL # 0.00 0.00 - 0.50 x103/uL    BASOPHIL # 0.00 0.00 - 0.10 x103/uL       Imaging Studies:  US VENOUS DOPPLER LOWER EXTREMITY BILATERAL: No Acute Findings    XR CHEST PA AND LATERAL: No Acute Findings    Interpreted by radiologist, Dr. Timoteo Ace and independently reviewed by me.    15:39 - EKG interpretation # 1 :  12-Lead EKG, interpreted by Dr. Jacqulyn Bath, reveals normal sinus rhythm, rate of 84 bpm, normal axis, Incomplete RBBB intervals. NO STEMI.    DNR Status:  No Order    Assessment/Plan:   Active Hospital Problems   (*Primary  Problem)    Diagnosis    *Chest pain    Elevated d-dimer       Chest pain and elevated D-dimer  --etiologies would include pulmonary embolus versus esophagitis  --Given patient's dye allergy decision was made to admit her for premedication for CT pulmonary angiogram in the a.m.  --V/Q scan is likely to be indeterminate given patient's smoking history  --serial troponin I  --continue Lovenox until CT scan resulted  --consider hypercoagulability workup if CT scan positive  --telemetry and pulse oximetry monitoring overnight    Tobacco use  --counseled regarding cessation  --consider nicotine replacement  therapy    Asthma  --continue p.r.n. inhaler    DVT/PE Prophylaxis: Lovenox and SCDs/ Venodynes

## 2015-07-24 NOTE — Nurses Notes (Signed)
CT to be called after 11am, to check with patient is to be going down to CT. Will inform next shift.

## 2015-07-24 NOTE — Pharmacy (Signed)
[facility]  [place], [place] [id number]  Pharmacy Chart Review      Armstead PeaksJessica Hardcastle  07/07/1985  [id number]    For this patient, I have reviewed their medication profile and have provided some recommendations.    Please consider the following changes:    Pt placed on lovenox 40mg  BID; pt given 60mg  once in ER; if being treated then dose should be 60mg  BID instead due to her weight of ~60kg.    Thanks,    Clydene Fakelireza Shelli Portilla, PHARMD  07/24/2015, 21:44

## 2015-07-24 NOTE — ED Nurses Note (Signed)
Pt given Ed box lunch

## 2015-07-24 NOTE — ED Provider Notes (Addendum)
Lindsay Mccullough  [facility]  Emergency Department Visit Note    Date: 07/24/2015  Primary care provider: No Established Pcp  Means of arrival: private car  History obtained by: patient and parent (mother)  History limited by: none      Chief Complaint:  Chest pain and Shortness of breath    History of Present Illness     Lindsay Mccullough, date of birth 1986-01-07, is a 30 y.o. female who presents to the Emergency Department for evaluation of sudden onset yesterday of sharp chest pain which radiates through to the back between the shoulder blades and up and down underneath the sternum with associated shortness of breath which began while eating at [facility].  Discomfort has been constant since yesterday is worse with deep inspiration.  Does not seem to be exacerbated by movement or palpation. 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 Patient went to urgent care today [facility]) for the symptoms and was sent to the emergency room after having a normal EKG and normal chest x-ray because of concern for pulmonary embolism.  Patient does smoke cigarettes and is on birth control pills.  No previous history of blood clots in the legs or lungs in the past.      Denies fever, chills, cough, wheezing, hemoptysis, nausea, vomiting, abdominal pain, or peripheral edema or rash.    HEART Score = Total Score: 1    Review of Systems     The pertinent positive and negative symptoms are as per HPI. All other systems reviewed and are negative.    Patient History      Past Medical History:  Past Medical History:   Diagnosis Date    Asthma     Dysplasia of cervix     H/O seasonal allergies      Past Surgical History:  Past Surgical History:   Procedure Laterality Date    HX DILATION AND CURETTAGE      HX NO SURGICAL PROCEDURES       Family History:  Family  History   Problem Relation Age of Onset    Congestive Heart Failure Mother     Hypertension Mother     High Cholesterol Mother     Heart Attack Mother     Hearing Loss Mother     Heart Disease Mother     Cirrhosis Father     Hypertension Father     High Cholesterol Father     Hypertension Brother     Diabetes Paternal Grandmother     Colon Cancer Other     Pancreatic Cancer Neg Hx     Ovarian Cancer Neg Hx     Osteoporosis Neg Hx     Rectal Cancer Neg Hx     Uterine Fibroids Neg Hx     Uterine Cancer Neg Hx     Clotting Disorder Neg Hx     Breast Cancer Neg Hx     Cervical Cancer Neg Hx      Social History:  Social History   Substance Use Topics    Smoking status: Former Smoker    Smokeless tobacco: Never Used    Alcohol use 0.0 oz/week     0 Standard drinks or equivalent per week     History   Drug Use Not on file  Medications:  No outpatient prescriptions have been marked as taking for the 07/24/15 encounter [facility] Encounter).     Allergies:   Allergies   Allergen Reactions    Amoxicillin Anaphylaxis    Iodine      Blisters on left arm    Penicillins Hives/ Urticaria       Physical Exam     ED Triage Vitals   Enc Vitals Group      BP (Non-Invasive) 07/24/15 1532 96/70      Heart Rate 07/24/15 1532 94      Respiratory Rate 07/24/15 1532 20      Temperature 07/24/15 1532 37 C (98.6 F)      Temp src --       SpO2-1 07/24/15 1532 99 %      Weight 07/24/15 1532 56.7 kg (125 lb)      Height --       Head Cir --       Peak Flow --       Pain Score --       Pain Loc --       Pain Edu? --       Excl. in GC? --        Vital Signs:  Filed Vitals:    07/24/15 1532 07/24/15 1730 07/24/15 1800 07/24/15 1919   BP: 96/70 98/73 108/80 111/76   Pulse: 94 90 92 88   Resp: 20 18 16 14    Temp: 37 C (98.6 F)      SpO2: 99% 99% 100% 100%       The initial visit vital signs are reviewed as above.   Pulse Ox: 99% on None (Room Air); interpreted by me as:  Normal  GENERAL:  This is a well appearing  30  y.o.  female  who is interactive, appropriate and showing no outward signs of distress.    HEENT:  Atraumatic, normocephalic.  Anicteric.  Conjunctiva normal in appearance.  PERRL  Oropharynx is clear and there is no difficulty handling secretions.  Mucous membranes are moist.    NECK:  Supple, no nuchal rigidity or meningeal signs. Trachea is midline. No JVD.  CHEST:  No signs of trauma.  Normal and equal expansion of the thorax while breathing.  Non-tender to palpation and no exacerbation of the discomfort with range of motion of the upper extremities.  HEART:  Regular rate and rhythm without murmurs, rubs, or gallops.  LUNGS:  Clear to ascultation bilaterally without any adventitious sounds.  ABDOMEN:  Non-distended.  Bowel sounds present.  Soft, non-tender to palpation without any rebound or guarding.  No obvious masses or organomegaly.  No pulsatile masses.  BACK:  Non-tender. No CVAT.  SKIN:  Warm, good color.  No significant rashes noted. No diaphoresis.  EXTREMITY:  Good range of motion.  Normal strength throughout.  No calf tenderness or peripheral edema present.   NEURO/PSYCH:  Patient is interactive, appropriate and no gross abnormal neurological findings.    Diagnostics     Labs:    Results for orders placed or performed during the hospital encounter of 07/24/15   COMPREHENSIVE METABOLIC PROFILE - [facility] ONLY   Result Value Ref Range    SODIUM 139 136 - 145 mmol/L    POTASSIUM 5.3 (H) 3.4 - 5.1 mmol/L    CHLORIDE 104 101 - 111 mmol/L    CO2 TOTAL 25 22 - 32 mmol/L    ANION GAP 10 3 - 11 mmol/L  BUN 8 6 - 20 mg/dL    CREATININE 1.61 0.96 - 1.00 mg/dL    BUN/CREA RATIO 14 6 - 22    ESTIMATED GFR >60 >60 mL/min/1.15m2    ALBUMIN 4.1 3.5 - 5.0 g/dL    CALCIUM 9.8 8.6 - 04.5 mg/dL    GLUCOSE 409 70 - 811 mg/dL    ALKALINE PHOSPHATASE 49 38 - 126 U/L    ALT (SGPT) 11 (L) 14 - 54 U/L    AST (SGOT) 15 15 - 41 U/L    BILIRUBIN TOTAL 0.5 0.3 - 1.2 mg/dL    PROTEIN TOTAL 7.1 6.4 - 8.3 g/dL    ALBUMIN/GLOBULIN  RATIO 1.4 0.8 - 2.0   TROPONIN-I   Result Value Ref Range    TROPONIN I <0.03 <=0.06 ng/mL   CBC WITH DIFF   Result Value Ref Range    WBC 5.1 4.0 - 11.0 x103/uL    RBC 3.98 (L) 4.00 - 5.10 x106/uL    HGB 13.0 12.0 - 15.5 g/dL    HCT 91.4 78.2 - 95.6 %    MCV 99.2 (H) 82.0 - 97.0 fL    MCH 32.6 27.5 - 33.2 pg    MCHC 32.8 32.0 - 36.0 g/dL    RDW 21.3 08.6 - 57.8 %    PLATELETS 216 150 - 450 x103/uL    MPV 7.5 7.4 - 10.5 fL    NEUTROPHIL % 59 43 - 76 %    LYMPHOCYTE % 36 15 - 43 %    MONOCYTE % 5 5 - 12 %    EOSINOPHIL % 0 0 - 5 %    BASOPHIL % 0 0 - 3 %    NEUTROPHIL # 3.00 1.50 - 6.50 x103/uL    LYMPHOCYTE # 1.80 1.00 - 4.80 x103/uL    MONOCYTE # 0.20 0.20 - 0.90 x103/uL    EOSINOPHIL # 0.00 0.00 - 0.50 x103/uL    BASOPHIL # 0.00 0.00 - 0.10 x103/uL   BLUE TOP TUBE   Result Value Ref Range    RAINBOW/EXTRA TUBE AUTO RESULT Yes    RED TOP TUBE   Result Value Ref Range    RAINBOW/EXTRA TUBE AUTO RESULT Yes    D-Dimer   Result Value Ref Range    D-DIMER 845 (H) <=230 ng/mL DDU   HCG, SERUM QUALITATIVE, PREGNANCY   Result Value Ref Range    HCG, QUALITATIVE SERUM Negative Negative   PT/INR   Result Value Ref Range    PROTHROMBIN TIME 11.1 9.4 - 12.5 seconds    INR 1.01    TROPONIN-I   Result Value Ref Range    TROPONIN I <0.03 <=0.06 ng/mL     Labs reviewed and interpreted by me.    Radiology:  US VENOUS DOPPLER LOWER EXTREMITY BILATERAL:   No Acute Findings    XR CHEST PA AND LATERAL:   No Acute Findings    Interpreted by radiologist, Dr. Timoteo Ace and independently reviewed by me.    15:39 - EKG interpretation # 1 :  12-Lead EKG, interpreted by Dr. Jacqulyn Bath, reveals normal sinus rhythm, rate of 84 bpm, normal axis, Incomplete RBBB intervals.  NO STEMI.    ED Progress Note/Medical Decision Making     Orders Placed This Encounter    US VENOUS DOPPLER LOWER EXTREMITY BILATERAL    XR CHEST PA AND LATERAL    CBC/DIFF    COMPREHENSIVE METABOLIC PROFILE - [facility] ONLY    TROPONIN-I    RAINBOW DRAW -  [facility] ONLY     CBC WITH DIFF    BLUE TOP TUBE    RED TOP TUBE    D-Dimer    HCG, SERUM QUALITATIVE, PREGNANCY    PT/INR    CANCELED: D-Dimer    TROPONIN-I    ECG 12-LEAD (Take to provider with a brief history)    INSERT & MAINTAIN PERIPHERAL IV ACCESS    NS flush syringe    GI cocktail (ANTACID SUSP, HYOSCYAMINE, LIDOCAINE) oral solution    enoxaparin (LOVENOX) 60 mg/0.6 mL SubQ injection    HYDROmorphone (DILAUDID) 1 mg/mL injection    ondansetron (ZOFRAN) 2 mg/mL injection     18:04 - my intention was to order a CT pulmonary angiogram of the patient however because of the patient's previous reaction to IV contrast dye I will not be ordered at this time.  Patient did have a reaction in August of last year (2016) and had to be premedicated prior to have an a contrast CT of the abdomen.  Patient's allergy reaction was she was warm all over and had difficulty breathing.  I discussed the patient at this point we will add a D-dimer and a chest x-ray as were not able to view the chest x-ray from the urgent care and we also will obtain a venous Doppler lower extremities bilaterally.    18:47 - patient is noted to have an elevated D-dimer with the patient's story I feel that she is high risk for pulmonary embolism or DVT and after discussion with Dr. dual my supervising physician we came to the agreement to start the patient on Lovenox 1 milligram/kilogram subcutaneously.  This was discussed with the patient.    19:07 - I discussed the patient with Dr. Bing QuarryeBord and despite the contrast dye allergy he felt the patient could undergo a premedication and have the CT here in the ER.  I did contact the CT tech and she will be contacting Dr. Everlene BallsPadha (Radiologist).    19:22 - patient states she was still having discomfort despite given a GI cocktail.  She states that she cannot drink all the GI cocktail cause was causing her throat and her symptoms do worsen.  Patient will be given a dose of Dilaudid IV 1 milligram and Zofran 4  milligrams IV.  I discussed the patient that we will be admitting her to the hospital will contact the hospitalist.  She was in agreement this plan.    20:27 - Lauren, CT tech, I contacted Dr. Everlene BallsPadha and it was recommended the patient undergo a 13 hour pretreatment prior to the CT Pulmonary Angio of the chest.  I also discussed this further for my supervising physician Dr. Jacqulyn Batheuell and he also agreed that the 13 hour pretreatment prior to the diagnostic study would be in the patient's best interest and safety prior to undergoing the study.  I will again contact with Dr. Bing QuarryeBord and speak to him further about this.    21:06 - After reviewing the patient's history, physical exam and diagnostics, I determined that the patient required further evaluation/treatment in the hospital.  A call was placed to the admitting physician, Dr. Bing QuarryeBord.  After discussing the patient's presentation, exam and diagnostics, they kindly agreed to admit the patient for further care and evaluation.    Pre-Disposition Vitals:  Filed Vitals:    07/24/15 1532 07/24/15 1730 07/24/15 1800 07/24/15 1919   BP: 96/70 98/73 108/80 111/76   Pulse: 94 90 92 88   Resp: 20 18 16  14   Temp: 37 C (98.6 F)      SpO2: 99% 99% 100% 100%       Clinical Impression      1. Acute Chest Pain Evaluation  2. Concern for Pulmonary Embolis  3. Elevated D-Dimer (845 ng/ml)  4. Acute Mild Hyperkalemia (5.3 mmol/L)    Plan/Disposition     Data Unavailable    Prescriptions:  New Prescriptions    No medications on file       Follow Up:  No follow-up provider specified.        Condition on Disposition: Stable

## 2015-07-24 NOTE — Progress Notes (Signed)
Subjective:    Patient ID: Regina Adams is a 30 y.o. female.    Chest Pain   This is a new problem. The current episode started yesterday. The onset quality is gradual. The problem occurs constantly. The problem has been unchanged. The pain is present in the substernal region. The pain is mild. The quality of the pain is described as sharp. The pain does not radiate. Pertinent negatives include no abdominal pain, back pain, claudication, cough, diaphoresis, dizziness, exertional chest pressure, fever, headaches, hemoptysis, irregular heartbeat, leg pain, lower extremity edema, malaise/fatigue, nausea, near-syncope, numbness, orthopnea, palpitations, PND, shortness of breath, sputum production, syncope, vomiting or weakness. The pain is aggravated by nothing. She has tried nothing for the symptoms. Risk factors include smoking/tobacco exposure. Past medical history comments: denies cardiac and pulmonary problems   Her family medical history is significant for CAD. Prior workup: none.       The following portions of the patient's history were reviewed and updated as appropriate: allergies, current medications, past family history, past medical history, past social history, past surgical history and problem list.    Review of Systems   Constitutional: Negative for fever, chills, malaise/fatigue, diaphoresis, activity change, appetite change, fatigue and unexpected weight change.   Respiratory: Negative for apnea, cough, hemoptysis, sputum production, chest tightness, shortness of breath and wheezing.    Cardiovascular: Positive for chest pain. Negative for palpitations, orthopnea, claudication, leg swelling, syncope, PND and near-syncope.   Gastrointestinal: Negative for nausea, vomiting, abdominal pain, diarrhea, constipation, blood in stool and abdominal distention.   Musculoskeletal: Negative for myalgias and back pain.   Skin: Negative for rash.   Neurological: Negative for dizziness, weakness, numbness and  headaches.         Objective:    BP 114/75 mmHg  Pulse 84  Temp(Src) 98.8 F (37.1 C) (Oral)  Resp 18  Ht 1.6 m (5\' 3" )  Wt 56.836 kg (125 lb 4.8 oz)  BMI 22.20 kg/m2  SpO2 100%  LMP 07/04/2015 (Exact Date)    Physical Exam   Constitutional: She is oriented to person, place, and time. She appears well-developed and well-nourished. No distress.   HENT:   Head: Normocephalic and atraumatic.   Right Ear: External ear normal.   Left Ear: External ear normal.   Mouth/Throat: Oropharynx is clear and moist. No oropharyngeal exudate.   Eyes: Conjunctivae and EOM are normal. Pupils are equal, round, and reactive to light. Right eye exhibits no discharge. Left eye exhibits no discharge.   Neck: Normal range of motion. Neck supple.   Cardiovascular: Normal rate, regular rhythm and normal heart sounds.    Pulmonary/Chest: Effort normal and breath sounds normal. No respiratory distress. She has no wheezes. She has no rales.   Lymphadenopathy:     She has no cervical adenopathy.   Neurological: She is alert and oriented to person, place, and time.   Skin: Skin is warm. No rash noted. She is not diaphoretic.   Psychiatric: She has a normal mood and affect.         Assessment and Plan:       Ashling was seen today for chest pain.    Diagnoses and all orders for this visit:    Chest pain, unspecified type  -     ECG 12 lead  -     XR Chest 2 Views    LMP--recent,denies being pregnant    CXR--wnl  EKG--RBBB incomplete.   Discussed pt  Emergency Department Transfer  Pt transferred to: [facility]    Reason for transfer: Chest Pain and SOB need to r/o PE, on OCP,Smoker  Pertinent studies performed: see progress note below  Mode of transportation: Private Vehicle - Family/Friend to drive patient    Spoke to: triage nurse at receiving facility    Pt expressed understanding of the reason for transfer to the ED, and agreed to go directly there.                Advised plenty of liquids,tylenol/ibuprofen PRN for  pain/fever.F/U with PCP/urgent care soon if symptoms persists/gets worse more than 2-3 days. If symptoms continues need ,labs.  Advise to watch for worsening symptoms,with those seek medical help immediatly  F/u with PCP 5-7 days  F/u PRN        Etheleen Sia, MD  [facility]  07/24/2015  2:12 PM

## 2015-07-25 ENCOUNTER — Observation Stay (HOSPITAL_BASED_OUTPATIENT_CLINIC_OR_DEPARTMENT_OTHER): Payer: MEDICAID

## 2015-07-25 LAB — CBC WITH DIFF
BASOPHIL #: 0 x10ˆ3/uL (ref 0.00–0.10)
BASOPHIL %: 0 % (ref 0–3)
BASOPHIL %: 0 % (ref 0–3)
EOSINOPHIL #: 0 x10ˆ3/uL (ref 0.00–0.50)
EOSINOPHIL %: 0 % (ref 0–5)
HCT: 37.2 % (ref 36.0–45.0)
HGB: 12.4 g/dL (ref 12.0–15.5)
LYMPHOCYTE #: 0.7 x10ˆ3/uL — ABNORMAL LOW (ref 1.00–4.80)
LYMPHOCYTE %: 17 % (ref 15–43)
MCH: 33 pg (ref 27.5–33.2)
MCHC: 33.2 g/dL (ref 32.0–36.0)
MCV: 99.4 fL — ABNORMAL HIGH (ref 82.0–97.0)
MONOCYTE #: 0 x10ˆ3/uL — ABNORMAL LOW (ref 0.20–0.90)
MONOCYTE %: 1 % — ABNORMAL LOW (ref 5–12)
MPV: 8.2 fL (ref 7.4–10.5)
NEUTROPHIL #: 3.5 10*3/uL (ref 1.50–6.50)
NEUTROPHIL #: 3.5 x10ˆ3/uL (ref 1.50–6.50)
NEUTROPHIL %: 82 % — ABNORMAL HIGH (ref 43–76)
PLATELETS: 202 x10ˆ3/uL (ref 150–450)
RBC: 3.74 x10ˆ6/uL — ABNORMAL LOW (ref 4.00–5.10)
RDW: 12.3 % (ref 11.0–16.0)
WBC: 4.3 x10?3/uL (ref 4.0–11.0)
WBC: 4.3 x10ˆ3/uL (ref 4.0–11.0)

## 2015-07-25 LAB — COMPREHENSIVE METABOLIC PROFILE - BMC/JMC ONLY
ALBUMIN/GLOBULIN RATIO: 1.2 (ref 0.8–2.0)
ALBUMIN: 3.8 g/dL (ref 3.5–5.0)
ALKALINE PHOSPHATASE: 49 U/L (ref 38–126)
ALT (SGPT): 10 U/L — ABNORMAL LOW (ref 14–54)
ANION GAP: 8 mmol/L (ref 3–11)
AST (SGOT): 15 U/L (ref 15–41)
BILIRUBIN TOTAL: 0.6 mg/dL (ref 0.3–1.2)
BUN/CREA RATIO: 19 (ref 6–22)
BUN: 12 mg/dL (ref 6–20)
CALCIUM: 9.4 mg/dL (ref 8.6–10.3)
CHLORIDE: 106 mmol/L (ref 101–111)
CO2 TOTAL: 26 mmol/L (ref 22–32)
CREATININE: 0.63 mg/dL (ref 0.44–1.00)
ESTIMATED GFR: >60 mL/min/1.73mˆ2 (ref >60)
GLUCOSE: 158 mg/dL — ABNORMAL HIGH (ref 70–110)
GLUCOSE: 158 mg/dL — ABNORMAL HIGH (ref 70–110)
POTASSIUM: 5.2 mmol/L — ABNORMAL HIGH (ref 3.4–5.1)
PROTEIN TOTAL: 7.1 g/dL (ref 6.4–8.3)
SODIUM: 140 mmol/L (ref 136–145)

## 2015-07-25 LAB — TROPONIN-I
TROPONIN I: <0.03 ng/mL (ref <=0.06)
TROPONIN I: <0.03 ng/mL (ref <=0.06)
TROPONIN I: <0.03 ng/mL (ref <=0.06)

## 2015-07-25 LAB — LIPID PANEL
CHOL/HDL RATIO: 4.9
CHOLESTEROL: 193 mg/dL (ref 120–199)
HDL CHOL: 39 mg/dL — ABNORMAL LOW (ref 45–65)
LDL CALC: 136 mg/dL — ABNORMAL HIGH (ref <=130)
TRIGLYCERIDES: 89 mg/dL (ref 35–135)
VLDL CALC: 18 mg/dL (ref 5–35)

## 2015-07-25 LAB — MAGNESIUM: MAGNESIUM: 1.9 mg/dL (ref 1.4–2.1)

## 2015-07-25 LAB — PT/INR
INR: 1.03
PROTHROMBIN TIME: 11.3 s (ref 9.4–12.5)

## 2015-07-25 LAB — PTT (PARTIAL THROMBOPLASTIN TIME): APTT: 29.7 s (ref 25.1–36.5)

## 2015-07-25 LAB — PHOSPHORUS: PHOSPHORUS: 3.3 mg/dL (ref 2.7–4.5)

## 2015-07-25 MED ORDER — IOPAMIDOL 370 MG IODINE/ML (76 %) INTRAVENOUS SOLUTION
100.00 mL | INTRAVENOUS | Status: AC
Start: 2015-07-25 — End: 2015-07-25
  Administered 2015-07-25: 12:00:00 85 mL via INTRAVENOUS
  Filled 2015-07-25: qty 100

## 2015-07-25 MED ORDER — SODIUM CHLORIDE 0.9 % INTRAVENOUS SOLUTION
INTRAVENOUS | Status: DC
Start: 2015-07-25 — End: 2015-07-25

## 2015-07-25 MED ADMIN — morphine 2 mg/mL intravenous syringe: INTRAVENOUS | @ 11:00:00

## 2015-07-25 NOTE — Nurses Notes (Signed)
Attending MD made aware of chest CT results.

## 2015-07-25 NOTE — Ancillary Notes (Signed)
Note from Dietitian Oswald HillockBrooke Flint Hakeem, RDLD 09:13 07/25/2015     Triggered for unintentional weight loss however, weight hx reviewed and no significant changes noted  Patient is a 30 yo normal weight female who presents with chest pain and elevated d-dimer. Currently in observation.  Reported SOB and chest pain after eating [employer].    Past Medical History:   Diagnosis Date    Asthma     Dysplasia of cervix     H/O seasonal allergies      Diet order is Regular; only intake so far is 25% at breakfast. Ate 100% of a snack last night.  Braden score= 21  Meds and Labs Reviewed: K 5.2, Glu 158 ( steroids ordered), LDL 136  Weight hx reviewed: 56.9 kg 07/05/15, 57.6 kg 03/25/15, 53 kg 10/24/14.Marland Kitchen. UBW 120# per ID issued 03/13/14. No significant changes noted.    Continue with diet as ordered; encourage adequate intakes  RD will continue to monitor and add nutritional interventions as needed  F/U in 4-5 days should patient remain in facility.

## 2015-07-25 NOTE — Nurses Notes (Signed)
Assumed care of patient for day shift. Sitting up in bed, eyes open watching TV. AM meds given per orders. Alert, oriented x4, speech clear. Respirations even, unlabored without signs of distress. Abdomen soft, rounded, non-tender. Denies pain & needs. Bed low, siderails up x2, call light & belongings in reach, non-skid socks on.

## 2015-07-25 NOTE — Discharge Summary (Signed)
[facility]    DISCHARGE SUMMARY      PATIENT NAMArmstead Peaks:  Lindsay Mccullough,Lindsay Mccullough  MRN:  [id number]  DOB:  02/04/1986    ADMISSION DATE:  07/24/2015  DISCHARGE DATE:  07/25/2015    ATTENDING PHYSICIAN: Pedro EarlsMercy Chrisangel Eskenazi, MD    PRIMARY CARE PHYSICIAN: No Established Pcp     ADMISSION DIAGNOSIS: Chest pain  Chief Complaint   Patient presents with    Shortness of Breath       DISCHARGE DIAGNOSIS:     Principle Problem:  Chest pain    Active Hospital Problems    Diagnosis Date Noted    Principle Problem: Chest pain 07/24/2015    Elevated d-dimer 07/24/2015      Resolved Hospital Problems    Diagnosis    No resolved problems to display.     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       Details    fluticasone 50 mcg/actuation Spray, Suspension   Commonly known as:  FLONASE    1 Spray, Each Nostril, Daily   Refills:  0       Norgestimate-Ethinyl Estradiol 0.25-35 mg-mcg Tablet   Commonly known as:  SPRINTEC (28)    1 Tab, Oral, Daily   Qty:  28 Tab   Refills:  11       ONE-A-DAY WOMENS FORMULA ORAL    Oral   Refills:  0       sertraline 50 mg Tablet   Commonly known as:  ZOLOFT    50 mg, Oral, Daily   Refills:  0             DISCHARGE INSTRUCTIONS:     DISCHARGE INSTRUCTION - DIET   Diet: NO RESTRICTIONS        Follow-up Information     Follow up with Gean Birchwoodabuena, Philomela, MD. Go on 08/01/2015.    Specialty:  EXTERNAL    Why:  New Patient / Hospital follow up at1:30 pm    Contact information:    [address]  [place] [id number]  [phone]            REASON FOR  HOSPITALIZATION AND HOSPITAL COURSE:  This is a 30 y.o., female who came with chest pain.     The following is a brief hospital course.     SIGNIFICANT PHYSICAL FINDINGS:     GENERAL: Patient is alert, awake and oriented X 3. In no cardio respiratory distress.   HEENT: PERRLA, EOMI.   NECK: Supple, NO JVD.   HEART: S1+, S2+, rate controlled and rhythm regular. No murmurs appreciated.   LUNGS: Clear to auscultation bilaterally.   ABDOMEN: Soft, NT, ND with NABS.   EXTREMITIES: No edema, pedal pulses palpable.   NEURO: Motor exam is non focal.   SKIN: No rashes or bruises.   PSYCH: Pleasant with no signs of depression.      SIGNIFICANT LAB:  Elevated D dimer  SIGNIFICANT RADIOLOGY:  None  CONSULTATIONS: none   PROCEDURES PERFORMED: None         COURSE IN HOSPITAL:     Chest pain and elevated D-dimer - Serial Cardiac enzymes negative. CT by PE protocol was negative.       Tobacco use - dependency - counseled regarding cessation. Nicotine patch offered. Advised not to smoke especially while on birth control pills.     Patient on Birth control pills - which can lead to hypercoagulable state.     Asthma - stable currently - continue p.r.n. inhaler    DVT/PE Prophylaxis - was on Lovenox and SCDs/ Venodynes    DOES PATIENT HAVE ADVANCED DIRECTIVES:  No, Information Offered and Refused    ADVANCED CARE PLANNING -  Her code status is full code    CONDITION ON DISCHARGE: Alert, Oriented and VS Stable    DISCHARGE DISPOSITION:  Home discharge        Copies sent to Care Team       Relationship Specialty Notifications Start End    Pcp, No Established PCP - General   09/20/14

## 2015-07-25 NOTE — Care Plan (Signed)
Problem: General Plan of Care(Adult,OB)  Goal: Plan of Care Review(Adult,OB)  The patient and/or their representative will communicate an understanding of their plan of care   Outcome: Ongoing (see interventions/notes)    07/25/15 0053   Coping/Psychosocial   Plan Of Care Reviewed With patient;mother         Problem: VTE, DVT and PE (Adult)  Prevent and manage potential problems including:1. deep vein thrombosis2. pulmonary embolism   Goal: Signs and Symptoms of Listed Potential Problems Will be Absent or Manageable (VTE, DVT and PE)  Signs and symptoms of listed potential problems will be absent or manageable by discharge/transition of care (reference VTE, DVT and PE (Adult) CPG).   Outcome: Ongoing (see interventions/notes)  Pt to be ruled out for a PE - prednisone at 13 hours, 7 hours and 1 hour before CT and benadryl 1 hour before also. Pt verbalized acceptance and understanding. Ativan administered during shift also r/t patient complaint of anxiety and requesting ativan. No further complaints/questions.

## 2015-07-25 NOTE — Nurses Notes (Signed)
Patient discharged home with family. AVS reviewed with patient including upcomint appointment. A written copy of the AVS and discharge instructions was given to the patient. Questions sufficiently answered as needed. Patient encouraged to follow up with PCP as indicated on scheduled appointment date. In the event of an emergency, patient instructed to call 911 or go to the nearest emergency room. Telemetry monitor removed. IV discontinued, pressure applied to site, followed by dressing. Patient instructed to apply pressure & call if bleeding persists. Patient voiced understanding of all instructions. Wheelchair offered, but patient declined. Patient ambulated to elevator accompanied by mother & father to discharge home.

## 2015-07-26 LAB — ECG 12-LEAD
Atrial Rate: 84 {beats}/min
Calculated P Axis: 50 degrees
Calculated R Axis: 88 degrees
Calculated T Axis: 47 degrees
PR Interval: 158 ms
QRS Duration: 100 ms
QT Interval: 360 ms
QTC Calculation: 425 ms
Ventricular rate: 84 {beats}/min

## 2015-07-27 ENCOUNTER — Telehealth (INDEPENDENT_AMBULATORY_CARE_PROVIDER_SITE_OTHER): Payer: Self-pay

## 2015-07-27 NOTE — Telephone Encounter (Signed)
Follow up call made to patient. Spoke with patient. Feeling a little better. Let them know to call us back with any questions or concerns.

## 2015-08-01 ENCOUNTER — Ambulatory Visit (HOSPITAL_BASED_OUTPATIENT_CLINIC_OR_DEPARTMENT_OTHER): Payer: MEDICAID | Attending: Family Medicine

## 2015-08-01 DIAGNOSIS — R7989 Other specified abnormal findings of blood chemistry: Secondary | ICD-10-CM | POA: Insufficient documentation

## 2015-08-01 LAB — COMPREHENSIVE METABOLIC PROFILE - BMC/JMC ONLY
ALBUMIN/GLOBULIN RATIO: 1.4 (ref 0.8–2.0)
ALBUMIN: 4.2 g/dL (ref 3.5–5.0)
ALKALINE PHOSPHATASE: 50 U/L (ref 38–126)
ALT (SGPT): 10 U/L — ABNORMAL LOW (ref 14–54)
ANION GAP: 8 mmol/L (ref 3–11)
AST (SGOT): 16 U/L (ref 15–41)
BILIRUBIN TOTAL: 0.7 mg/dL (ref 0.3–1.2)
BUN/CREA RATIO: 19 (ref 6–22)
BUN: 14 mg/dL (ref 6–20)
CALCIUM: 9.7 mg/dL (ref 8.6–10.3)
CHLORIDE: 104 mmol/L (ref 101–111)
CO2 TOTAL: 27 mmol/L (ref 22–32)
CREATININE: 0.75 mg/dL (ref 0.44–1.00)
ESTIMATED GFR: >60 mL/min/1.73mˆ2 (ref >60)
ESTIMATED GFR: >60 mL/min/{1.73_m2} (ref >60)
GLUCOSE: 121 mg/dL — ABNORMAL HIGH (ref 70–110)
POTASSIUM: 4.7 mmol/L (ref 3.4–5.1)
PROTEIN TOTAL: 7.3 g/dL (ref 6.4–8.3)
SODIUM: 139 mmol/L (ref 136–145)

## 2015-08-01 LAB — CBC WITH DIFF
BASOPHIL #: 0 x10ˆ3/uL (ref 0.00–0.10)
BASOPHIL %: 0 % (ref 0–3)
EOSINOPHIL #: 0 x10ˆ3/uL (ref 0.00–0.50)
EOSINOPHIL %: 1 % (ref 0–5)
HCT: 40.6 % (ref 36.0–45.0)
HGB: 13.4 g/dL (ref 12.0–15.5)
LYMPHOCYTE #: 2.2 x10ˆ3/uL (ref 1.00–4.80)
LYMPHOCYTE %: 38 % (ref 15–43)
MCH: 32.6 pg (ref 27.5–33.2)
MCHC: 33 g/dL (ref 32.0–36.0)
MCV: 98.9 fL — ABNORMAL HIGH (ref 82.0–97.0)
MONOCYTE #: 0.4 x10ˆ3/uL (ref 0.20–0.90)
MONOCYTE %: 6 % (ref 5–12)
MPV: 7.9 fL (ref 7.4–10.5)
NEUTROPHIL #: 3.3 x10ˆ3/uL (ref 1.50–6.50)
NEUTROPHIL %: 56 % (ref 43–76)
PLATELETS: 228 10*3/uL (ref 150–450)
PLATELETS: 228 x10ˆ3/uL (ref 150–450)
RBC: 4.1 x10ˆ6/uL (ref 4.00–5.10)
RDW: 12.6 % (ref 11.0–16.0)
WBC: 5.9 x10ˆ3/uL (ref 4.0–11.0)

## 2015-08-01 LAB — RHEUMATOID FACTOR, SERUM: RHEUMATOID FACTOR: <15 [IU]/mL (ref <=30)

## 2015-08-01 LAB — D-DIMER: D-DIMER: 644 ng/mL — ABNORMAL HIGH (ref <=230)

## 2015-08-05 LAB — ANA (ANTINUCLEAR ANTIBODIES), SERUM
ANTI-NUCLEAR ANTIBODIES,QUALITATIVE: NEGATIVE
ANTI-NUCLEAR ANTIBODIES,QUANTITATIVE: 0.25 {index_val} (ref <=0.90)

## 2015-08-26 ENCOUNTER — Telehealth (HOSPITAL_BASED_OUTPATIENT_CLINIC_OR_DEPARTMENT_OTHER): Payer: Self-pay

## 2015-08-26 NOTE — Telephone Encounter (Signed)
NP for Dr. Yetta BarreJones. Persistent elevation of D-Dimer. REF: Dr. Namon Cirriabuena. appt 08/30/15 @ 2:00pm. Patient is aware of this appt. Lindsay Mccullough

## 2015-08-30 ENCOUNTER — Inpatient Hospital Stay (HOSPITAL_BASED_OUTPATIENT_CLINIC_OR_DEPARTMENT_OTHER): Payer: MEDICAID

## 2015-08-30 ENCOUNTER — Ambulatory Visit (HOSPITAL_BASED_OUTPATIENT_CLINIC_OR_DEPARTMENT_OTHER)
Admission: RE | Admit: 2015-08-30 | Discharge: 2015-08-30 | Disposition: A | Discharge status: Home / Self Care | Payer: MEDICAID | Source: Ambulatory Visit

## 2015-08-30 VITALS — BP 100/71 | HR 86 | Temp 98.1°F | Resp 16 | Ht 63.11 in

## 2015-08-30 DIAGNOSIS — R799 Abnormal finding of blood chemistry, unspecified: Secondary | ICD-10-CM | POA: Insufficient documentation

## 2015-08-30 DIAGNOSIS — Z79899 Other long term (current) drug therapy: Secondary | ICD-10-CM | POA: Insufficient documentation

## 2015-08-30 DIAGNOSIS — IMO0002 Reserved for concepts with insufficient information to code with codable children: Secondary | ICD-10-CM

## 2015-08-30 DIAGNOSIS — J302 Other seasonal allergic rhinitis: Secondary | ICD-10-CM | POA: Insufficient documentation

## 2015-08-30 DIAGNOSIS — R7989 Other specified abnormal findings of blood chemistry: Secondary | ICD-10-CM | POA: Insufficient documentation

## 2015-08-30 DIAGNOSIS — Z793 Long term (current) use of hormonal contraceptives: Secondary | ICD-10-CM | POA: Insufficient documentation

## 2015-08-30 DIAGNOSIS — Z87891 Personal history of nicotine dependence: Secondary | ICD-10-CM | POA: Insufficient documentation

## 2015-08-30 LAB — D-DIMER: D-DIMER: 961 ng/mL — ABNORMAL HIGH (ref <=230)

## 2015-08-31 NOTE — Progress Notes (Signed)
[facility]  New Patient History and Physical      Date: 08/30/2015  Name: Lindsay PeaksJessica Fagerstrom  MRN: [id number]  Referring Physician: No ref. provider found  Primary Care Provider: Gean BirchwoodPhilomela Tabuena, MD    Reason for visit/consultation New Patient (persistant elevation of D-dimer)      History of Present Illness:  Information Obtained from: patient and mother  This 89 y o woman sent for abnormal blood test.  She does not know of any history of clotting- venous or arterial.  She does not c/o lower extremity edema, chest pain, or dyspnea.  She was in the hospital in May for chest pain.  Evaluation was negative for venous thrombosis.  D dimer was elevated.      Past Medical History  Past Medical History:   Diagnosis Date    Asthma     Dysplasia of cervix     H/O seasonal allergies          Past Surgical History:   Procedure Laterality Date    HX DILATION AND CURETTAGE      HX NO SURGICAL PROCEDURES           Allergies   Allergen Reactions    Amoxicillin Anaphylaxis    Iodine      Blisters on left arm    Penicillins Hives/ Urticaria     Current Outpatient Prescriptions   Medication Sig    fluticasone (FLONASE) 50 mcg/actuation Nasal Spray, Suspension 1 Spray by Each Nostril route Once a day    MULTIVITS,CA,MINERALS/IRON/FA (ONE-A-DAY WOMENS FORMULA ORAL) Take by mouth    Norgestimate-Ethinyl Estradiol (SPRINTEC, 28,) 0.25-35 mg-mcg Oral Tablet Take 1 Tab by mouth Once a day    sertraline (ZOLOFT) 50 mg Oral Tablet Take 50 mg by mouth Once a day    spironolactone (ALDACTONE) 50 mg Oral Tablet Take 50 mg by mouth Twice daily       Family History  Family History   Problem Relation Age of Onset    Congestive Heart Failure Mother     Hypertension Mother     High Cholesterol Mother     Heart Attack Mother     Hearing Loss Mother     Heart Disease Mother     Cirrhosis Father     Hypertension Father     High Cholesterol Father     Hypertension Brother     Diabetes Paternal Grandmother     Colon Cancer Other      Pancreatic Cancer Neg Hx     Ovarian Cancer Neg Hx     Osteoporosis Neg Hx     Rectal Cancer Neg Hx     Uterine Fibroids Neg Hx     Uterine Cancer Neg Hx     Clotting Disorder Neg Hx     Breast Cancer Neg Hx     Cervical Cancer Neg Hx          Social History  Occupation:   Social History     Occupational History    Not on file.    HometownFreddi Starr: [employer] [place] [phone]   reports that she has quit smoking. She has never used smokeless tobacco. She reports that she drinks about 0 standard drinks or equivalent per week.  ROS:   Constitutional: negative  Eyes: negative  Ears, nose, mouth, throat, and face: negative  Respiratory: negative  Cardiovascular: negative  Gastrointestinal: negative  Genitourinary:negative  Integument/breast: negative  Hematologic/lymphatic: negative  Musculoskeletal:negative  Neurological: negative  Behavioral/Psych: negative  Endocrine: negative  Allergic/Immunologic: negative      Physical Examination:  Most Recent Vitals       [facility], A department [facility]    Temperature 36.7 C (98.1 F) filed at... 08/30/2015 1414    Heart Rate 86 filed at... 08/30/2015 1414    Respiratory Rate 16 filed at... 08/30/2015 1414    BP (Non-Invasive) 100/71 filed at... 08/30/2015 1414    Height 1.603 m (5' 3.11") filed at... 08/30/2015 1414    Weight     BMI (Calculated)     BSA (Calculated)         ECOG Status: 0 - Fully active, able to carry on all pre-disease performance without restriction.     General: appears in good health  Eyes: Conjunctiva clear.  HENT:ENT without erythema or injection, mucous membranes moist.  Neck: No JVD or thyromegaly or lymphadenopathy  Lungs: clear to auscultation bilaterally.   Cardiovascular: Regular rate and rhythm  Abdomen: soft, non-tender  Extremities: no cyanosis or edema  Skin: Skin warm and dry  Neurologic: romberg is normal  Lymphatics: No nodes appreciated  Psychiatric: AOx3  Access:  No current access  site    Labs  Results for orders placed or performed during the hospital encounter of 08/30/15 (from the past 36 hour(s))   D-DIMER   Result Value Ref Range    D-DIMER 961 (H) <=230 ng/mL DDU    Narrative    Negative Predictive Value (NPV) Cutoff Studies:    230 ng/mL DDU or lower was established in manufacturer's studies with a 95% confidence interval when screening for DVT (Deep Vein Thrombosis) and PE (Pulmonary Embolism).    A negative D-Dimer result when combined with a clinical assessment of low pre-test probability has been shown to have a high negative predictive value for DVT or PE.         Radiology    CT angiogram and bilateral lower extremity venous duplex:  Negative for PE or DVT of legs.     Assessment/Plan:   1. Abnormal blood test      She has persistently elevated d dimer with no clinical evidence for venous thrombosis.  This is non specific for clotting and can be elevated in other disorders which she does not appear to have (liver disease, renal failure, sepsis, heart failure, inflammatory disorders, etc).      It may be worthwhile to put her on a course of anti coagulation to see if this effects the dimer level.  I will research.      Thank you for asking me to see this pleasant young woman.       Mingo AmberMatthew Quaran Kedzierski, MD    CC:  Gean BirchwoodPhilomela Tabuena, MD  [address]  [place] [phone]    No primary care provider on file.

## 2015-09-16 ENCOUNTER — Encounter (INDEPENDENT_AMBULATORY_CARE_PROVIDER_SITE_OTHER): Payer: Self-pay | Admitting: OBSTETRICS/GYNECOLOGY

## 2015-09-16 ENCOUNTER — Ambulatory Visit (INDEPENDENT_AMBULATORY_CARE_PROVIDER_SITE_OTHER): Payer: MEDICAID | Admitting: OBSTETRICS/GYNECOLOGY

## 2015-09-16 VITALS — BP 104/64 | HR 92 | Temp 98.4°F | Ht 63.0 in | Wt 122.4 lb

## 2015-09-16 DIAGNOSIS — Z09 Encounter for follow-up examination after completed treatment for conditions other than malignant neoplasm: Secondary | ICD-10-CM

## 2015-09-16 DIAGNOSIS — E282 Polycystic ovarian syndrome: Secondary | ICD-10-CM

## 2015-09-16 DIAGNOSIS — L680 Hirsutism: Secondary | ICD-10-CM

## 2015-09-16 DIAGNOSIS — R7989 Other specified abnormal findings of blood chemistry: Secondary | ICD-10-CM

## 2015-09-16 DIAGNOSIS — Z3009 Encounter for other general counseling and advice on contraception: Secondary | ICD-10-CM

## 2015-09-16 NOTE — Progress Notes (Signed)
[facility]  [address]  [place], [place]  [address]  [phone]        Name of Patient: Lindsay Mccullough  DOB: 1985-04-04  MRN: [id number]    Date of Service: 09/16/2015        Subjective:    30 y.o. [id number]     Chief Complaint   Patient presents with    Medication follow up     spirolactone. labs         Here for follow-up.   She was started on combined OCP for treatment of OCP and had been doing well. She was also started on Spironolactone for treatment of her advanced hirsutism.   Since last visit she was seen in ED with chest pain and worked up for PE. CT negative but she did have elevated D-Dimer Was told to stop OCP. No confirmation of any VTE. Continues to have elevated D-Dimer.     She has been having regular menses on OCP. Went > 1 year without cycle prior to this.    Has to stop spironolactone due to SE of rash and severe nausea. She does not want to take this again.   She would like to discuss other contraceptive options.       REVIEW OF SYSTEMS :    Constitutional: Negative. Denies any fever, chills or fatigue   Gastrointestinal: Negative.  Denies any change in bowel habits, nausea or vomiting  Genitourinary:        See HPI       Patient's PMH/PSH/FH/SH/Meds/Allergies were all reviewed and noted in the EPIC chart on today's visit.    Past Medical History:   Diagnosis Date    Asthma     Dysplasia of cervix     H/O seasonal allergies          Past Surgical History:   Procedure Laterality Date    HX DILATION AND CURETTAGE      HX NO SURGICAL PROCEDURES           Family History   Problem Relation Age of Onset    Congestive Heart Failure Mother     Hypertension Mother     High Cholesterol Mother     Heart Attack Mother     Hearing Loss Mother     Heart Disease Mother     Cirrhosis Father     Hypertension Father     High Cholesterol Father     Hypertension Brother     Diabetes Paternal Grandmother     Colon Cancer Other     Pancreatic Cancer Neg Hx      Ovarian Cancer Neg Hx     Osteoporosis Neg Hx     Rectal Cancer Neg Hx     Uterine Fibroids Neg Hx     Uterine Cancer Neg Hx     Clotting Disorder Neg Hx     Breast Cancer Neg Hx     Cervical Cancer Neg Hx          Current Outpatient Prescriptions   Medication Sig Dispense Refill    ASPIRIN/ACETAMINOPHEN/CAFFEINE (EXCEDRIN MIGRAINE ORAL) Take by mouth      fluticasone (FLONASE) 50 mcg/actuation Nasal Spray, Suspension 1 Spray by Each Nostril route Once a day      MULTIVITS,CA,MINERALS/IRON/FA (ONE-A-DAY WOMENS FORMULA ORAL) Take by mouth      Norgestimate-Ethinyl Estradiol (SPRINTEC, 28,) 0.25-35 mg-mcg Oral Tablet Take 1 Tab by mouth Once a day 28 Tab 11  sertraline (ZOLOFT) 50 mg Oral Tablet Take 50 mg by mouth Once a day       No current facility-administered medications for this visit.      Allergies   Allergen Reactions    Amoxicillin Anaphylaxis    Spironolactone Rash and Hives/ Urticaria    Iodine      Blisters on left arm    Penicillins Hives/ Urticaria     Social History     Social History    Marital status: Divorced     Spouse name: N/A    Number of children: N/A    Years of education: N/A     Occupational History    Not on file.     Social History Main Topics    Smoking status: Former Smoker    Smokeless tobacco: Never Used    Alcohol use 0.0 oz/week     0 Standard drinks or equivalent per week    Drug use: Not on file    Sexual activity: Not on file     Other Topics Concern    Breast Self Exam No     Social History Narrative           Objective:  BP 104/64   Pulse 92   Temp 36.9 C (98.4 F) (Oral)    Ht 1.6 m (5\' 3" )   Wt 55.5 kg (122 lb 6.4 oz)   LMP 08/28/2015   BMI 21.68 kg/m2  Physical Exam:    Constitutional: She is oriented. She appears well-developed and well-nourished.     HENT: Head: Normocephalic.  Ferriman Galloway Stage 4 hirsutism     Abdomen: She exhibits no distension. Soft. No tenderness. She has no rebound and no guarding.     Psychiatric: She has a normal  mood and affect. Her behavior is normal.     Extremities:  No edema    Skin:  normal        Assessment & Plan:     ICD-10-CM    1. Encounter for general counseling and advice on contraceptive management Z30.09    2. D-dimer, elevated R79.89    3. PCOS (polycystic ovarian syndrome) E28.2 US PELVIS COMPLETE (TA/TV)   4. Hirsutism L68.0    5. Follow-up exam after treatment Z09        Contraceptive counseling done today. Discussed all options, OCP, Depo Provera, Nexplanon, IUD's, Nuva Ring. Do to elevated D-dimer and question of possible VTE do not recommend estrogen containing contraceptive.  All R/B and SE reviewed at length. She would like to proceed Mirena IUD insertion. 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     Will schedule    Patient encouraged to go to ED if she has any chest pain/SOB     The patient verbalizes a reasonable understanding of her diagnosis and is in agreement with the plan of care.  All risks, benefits and alternatives were reviewed with the patient.  All questions were answered.     Greater that 50% of a 15 minute appointment was spent in consultation.         Orders Placed This Encounter    US PELVIS COMPLETE (TA/TV)

## 2015-09-30 ENCOUNTER — Other Ambulatory Visit (HOSPITAL_BASED_OUTPATIENT_CLINIC_OR_DEPARTMENT_OTHER): Payer: Self-pay

## 2015-09-30 DIAGNOSIS — R7989 Other specified abnormal findings of blood chemistry: Secondary | ICD-10-CM

## 2015-10-01 ENCOUNTER — Ambulatory Visit (INDEPENDENT_AMBULATORY_CARE_PROVIDER_SITE_OTHER): Payer: Medicaid (Managed Care) | Admitting: Family

## 2015-10-01 ENCOUNTER — Other Ambulatory Visit
Admission: RE | Admit: 2015-10-01 | Discharge: 2015-10-01 | Disposition: A | Discharge status: Home / Self Care | Payer: Medicaid (Managed Care) | Source: Ambulatory Visit | Attending: Family | Admitting: Family

## 2015-10-01 ENCOUNTER — Encounter (INDEPENDENT_AMBULATORY_CARE_PROVIDER_SITE_OTHER): Payer: Self-pay | Admitting: Family

## 2015-10-01 VITALS — BP 102/70 | HR 83 | Temp 98.3°F | Resp 16 | Ht 63.0 in | Wt 123.2 lb

## 2015-10-01 DIAGNOSIS — R300 Dysuria: Secondary | ICD-10-CM

## 2015-10-01 DIAGNOSIS — J029 Acute pharyngitis, unspecified: Secondary | ICD-10-CM

## 2015-10-01 LAB — VH POCT UA-AUTOMATED(UCC)
Bilirubin, UA POCT: NEGATIVE
Glucose, UA POCT: NEGATIVE
Ketones, UA POCT: NEGATIVE mg/dL
Nitrite, UA POCT: POSITIVE — AB
PH, UA POCT: 7 (ref 4.6–8)
Protein, UA POCT: NEGATIVE mg/dL
Specific Gravity, UA POCT: 1.02 mg/dL (ref 1.001–1.035)
Urine Leukocytes POCT: NEGATIVE
Urobilinogen, UA POCT: 0.2 mg/dL

## 2015-10-01 LAB — POCT RAPID STREP A: Rapid Strep A Screen POCT: NEGATIVE

## 2015-10-01 MED ORDER — LIDOCAINE VISCOUS 2 % MT SOLN
5.0000 mL | Freq: Four times a day (QID) | OROMUCOSAL | Status: DC | PRN
Start: 2015-10-01 — End: 2015-12-19

## 2015-10-01 MED ORDER — SULFAMETHOXAZOLE-TRIMETHOPRIM 800-160 MG PO TABS
1.0000 | ORAL_TABLET | Freq: Two times a day (BID) | ORAL | Status: AC
Start: 2015-10-01 — End: 2015-10-08

## 2015-10-01 MED ORDER — FLUTICASONE PROPIONATE 50 MCG/ACT NA SUSP
1.0000 | Freq: Every day | NASAL | Status: DC
Start: 2015-10-01 — End: 2016-01-03

## 2015-10-01 NOTE — Progress Notes (Signed)
Subjective:    Patient ID: Regina Adams is a 30 y.o. female.    Sore Throat   This is a new problem. The current episode started yesterday. The problem has been unchanged. Neither side of throat is experiencing more pain than the other. There has been no fever. The pain is moderate. Pertinent negatives include no abdominal pain, congestion, coughing, diarrhea, drooling, ear discharge, ear pain, headaches, hoarse voice, plugged ear sensation, neck pain, shortness of breath, stridor, swollen glands, trouble swallowing or vomiting. She has had exposure to strep. She has tried nothing for the symptoms.   Dysuria   This is a new problem. Episode onset: 3 days. The problem occurs every urination. The problem has been gradually worsening. The quality of the pain is described as burning. The pain is moderate. There has been no fever. She is not sexually active. There is no history of pyelonephritis. Associated symptoms include frequency and urgency. Pertinent negatives include no chills, discharge, flank pain, hematuria, hesitancy, nausea, possible pregnancy, sweats or vomiting. Treatments tried: Azo. There is no history of catheterization, kidney stones, recurrent UTIs, a single kidney, urinary stasis or a urological procedure.       The following portions of the patient's history were reviewed and updated as appropriate: allergies, current medications, past family history, past medical history, past social history, past surgical history and problem list.    Review of Systems   Constitutional: Negative for fever, chills, diaphoresis and fatigue.   HENT: Positive for sore throat (since yesterday). Negative for congestion, drooling, ear discharge, ear pain, hearing loss, hoarse voice, postnasal drip, rhinorrhea, sinus pressure and trouble swallowing.    Eyes: Negative for pain and redness.   Respiratory: Negative for cough, shortness of breath and stridor.    Cardiovascular: Negative for chest pain.    Gastrointestinal: Negative for nausea, vomiting, abdominal pain and diarrhea.   Genitourinary: Positive for dysuria (x3 days), urgency and frequency. Negative for hesitancy, hematuria, flank pain, difficulty urinating, genital sores (denies possibility of std) and menstrual problem (denies possibility of pregnancy).   Musculoskeletal: Negative for myalgias and neck pain.   Skin: Negative for rash.   Neurological: Negative for headaches.   All other systems reviewed and are negative.       Allergies   Allergen Reactions   . Amoxicillin Anaphylaxis   . Spironolactone Hives and Rash   . Iodine      Blisters on left arm   . Penicillins Hives       Past Medical History   Diagnosis Date   . Depression    . Anemia    . Anxiety    . Asthma without status asthmaticus    . Hormone disorder        Past Surgical History   Procedure Laterality Date   . No past surgeries           Objective:    BP 102/70 mmHg  Pulse 83  Temp(Src) 98.3 F (36.8 C) (Oral)  Resp 16  Ht 1.6 m (5\' 3" )  Wt 55.883 kg (123 lb 3.2 oz)  BMI 21.83 kg/m2  LMP 09/27/2015 (Approximate)    Physical Exam   Constitutional: She is oriented to person, place, and time. Vital signs are normal. She appears well-developed and well-nourished.  Non-toxic appearance. She does not have a sickly appearance. She does not appear ill. No distress.   HENT:   Head: Normocephalic and atraumatic.   Right Ear: Hearing, tympanic membrane, external ear and ear  canal normal. Tympanic membrane is not injected, not erythematous, not retracted and not bulging.   Left Ear: Hearing, tympanic membrane, external ear and ear canal normal. Tympanic membrane is not injected, not erythematous, not retracted and not bulging.   Nose: No mucosal edema or rhinorrhea.   Mouth/Throat: Posterior oropharyngeal erythema (pnd) present. No oropharyngeal exudate, posterior oropharyngeal edema or tonsillar abscesses.   Eyes: Conjunctivae are normal. Right eye exhibits no discharge. Left eye  exhibits no discharge.   Cardiovascular: Normal rate, regular rhythm and normal heart sounds.  Exam reveals no gallop and no friction rub.    No murmur heard.  Pulmonary/Chest: Effort normal and breath sounds normal. No tachypnea. No respiratory distress. She has no decreased breath sounds. She has no wheezes. She has no rhonchi. She has no rales.   Abdominal: Soft. Normal appearance. She exhibits no distension, no ascites and no mass. There is no hepatosplenomegaly. There is tenderness (mild pressure) in the suprapubic area. There is no rigidity, no rebound, no guarding, no CVA tenderness, no tenderness at McBurney's point and negative Murphy's sign.   Lymphadenopathy:        Head (right side): No submental, no submandibular, no tonsillar, no preauricular, no posterior auricular and no occipital adenopathy present.        Head (left side): No submental, no submandibular, no tonsillar, no preauricular, no posterior auricular and no occipital adenopathy present.     She has no cervical adenopathy.        Right: No supraclavicular adenopathy present.        Left: No supraclavicular adenopathy present.   Neurological: She is alert and oriented to person, place, and time.   Skin: Skin is warm, dry and intact. No rash noted. She is not diaphoretic.   Psychiatric: She has a normal mood and affect. Her speech is normal.   Nursing note and vitals reviewed.       Lab Results from today's visit:  Recent Results (from the past 12 hour(s))   POCT RAPID STREP A    Collection Time: 10/01/15  4:10 PM   Result Value Ref Range    POCT QC Pass     Rapid Strep A Screen POCT Negative  Negative    Comment       Negative Results should be confirmed by throat Cx to confirm absence of Strep A inf.   VH POCT UA AUTO    Collection Time: 10/01/15  4:14 PM   Result Value Ref Range    Color UA POCT Orange     Clarity UA POCT Slightly Cloudy     Glucose, UA POCT Negative Negative    Bilirubin, UA POCT Negative Negative    Ketones, UA POCT  Negative Negative mg/dL    Specific Gravity, UA POCT 1.020 1.001 - 1.035 mg/dL    Blood, UA POCT  Small (A) Negative    PH, UA POCT 7.0 4.6 - 8    Protein, UA POCT Negative Negative mg/dL    Urobilinogen, UA POCT 0.2 0.2, 1.0 mg/dL    Nitrite, UA POCT Positive (A) Negative    Leukocytes, UA POCT Negative Negative         Assessment and Plan:       Jenessa was seen today for sore throat and dysuria.    Diagnoses and all orders for this visit:    Pharyngitis, unspecified etiology  -     POCT RAPID STREP A  -     lidocaine viscous (XYLOCAINE)  2 % solution; Take 5 mLs by mouth every 6 (six) hours as needed for Pain.  -     fluticasone (FLONASE) 50 MCG/ACT nasal spray; 1-2 sprays by Nasal route daily.    Dysuria  -     VH POCT UA AUTO  -     Urine Culture; Future  -     sulfamethoxazole-trimethoprim (BACTRIM DS) 800-160 MG per tablet; Take 1 tablet by mouth 2 (two) times daily.      Sore Throat:  -Discussed that symptoms are likely due to viral illness or allergy:  -POCT Strep: negative.  -Take Tylenol or Ibuprofen every 4-6 hours as needed for pain or fever.  -Drink lots of water, monitor PO intake and urine output for hydration.  -Chloraseptic spray/Viscous lidocaine for sore throat.  -Flonase/Zyrtec/Claritin for allergy symptoms.  -Remember to wash your hands.    Dysuria:  -POCT urinalysis: orange, slightly cloudy, small blood, positive nitrates.  -Bactrim DS for + UA  -Urine culture sent out. Will call if positive and need to change antibiotic.  -Drink lots of water, and monitor urine output for hydration. If unable to stay hydrated, go to the Emergency Room.  -Remember to wipe from front to back.  -Follow up if not improved.    -Declines work note.    Go to the ER for any new or worsening symptoms that concern you.  Follow-up with your primary care doctor or return to Urgent Care if your symptoms do not improve.    Patient agrees with the plan.        Carolin Sicks, NP  [facility]  10/01/2015  4:27  PM

## 2015-10-01 NOTE — Progress Notes (Signed)
Urine culture collected from patient by [name]. This staff member prepared specimen, applied required charges and placed in courier box for lab courier to pick up. Regina Adams

## 2015-10-01 NOTE — Patient Instructions (Signed)
Bladder Infection,Female (Adult)    Urine is normally doesn't have any bacteria in it. But bacteria can get into the urinary tract from the skin around the rectum. Or they can travel in the blood from elsewhere in the body. Once they are in your urinary tract, they can cause infection in the urethra (urethritis), the bladder (cystitis), or the kidneys (pyelonephritis).  The most common place for an infection is in the bladder. This is called a bladder infection. This is one of the most common infections in women. Most bladder infections are easily treated. They are not serious unless the infection spreads to the kidney.  The phrases "bladder infection," "UTI," and "cystitis" are often used to describe the same thing. But they are not always the same. Cystitis is an inflammation of the bladder. Themost common cause of cystitis is an infection.  Symptoms  The infection causes inflammation in the urethra and bladder. This causes many of the symptoms. The most common symptoms of a bladder infection are:   Pain or burning when urinating   Having to urinate more often than usual   Urgent need to urinate   Only a small amount of urine comes out   Blood in urine   Abdominal discomfort. This is usually in the lower abdomen above the pubic bone.   Cloudy urine   Strong- or bad-smelling urine   Unable to urinate (urinary retention)   Unable to hold urine in (urinary incontinence)   Fever   Loss of appetite   Confusion (in older adults)  Causes  Bladder infections are not contagious. You can't get one from someone else, from a toilet seat, or from sharing a bath.  The most common cause of bladder infections is bacteria from the bowels. The bacteria get onto the skin around the opening of the urethra. From there, they can get into the urine and travel up to the bladder, causing inflammation and infection. This usually happens because of:   Wiping improperly after urinating. Always wipe from front to  back.   Bowel incontinence   Pregnancy   Procedures such as having a catheter inserted   Older age   Not emptying your bladder. This can allow bacteria a chance to grow in your urine.   Dehydration   Constipation   Sex   Use of a diaphragm for birth control  Treatment  Bladder infections are diagnosed by a urine test. They are treated with antibiotics and usuallyclear up quickly without complications. Treatment helps prevent a more serious kidney infection.  Medicines  Medicines can help in the treatment of a bladder infection:   Take antibiotics until they are used up, even if you feel better. It is important to finish them to make sure the infection has cleared.   You can use acetaminophen or ibuprofen for pain, fever, or discomfort, unless another medicine was prescribed. If you have chronic liver or kidney disease, talk with your healthcareprovider before usingthese medicines. Also talk with your provider if you've ever had a stomach ulcer or gastrointestinal bleeding, or are taking blood-thinner medicines.   If you are givenphenazopydridine to reduce burning with urination, it will cause your urine to become a bright orange color. This can stain clothing.  Care and prevention  These self-care steps can help prevent future infections:   Drink plenty of fluids to prevent dehydration and flush out your bladder. Do thisunless you must restrict fluids for other health reasons, or your doctor told you not to.     Proper cleaning after going to the bathroom is important. Wipe from front to back after using the toilet to prevent the spread of bacteria.   Urinate more often. Don't try to hold urine in for a long time.   Wear loose-fitting clothes and cotton underwear. Avoid tight-fitting pants.   Improve your diet and prevent constipation. Eat more fresh fruit and vegetables, andfiber, and less junk and fatty foods.   Avoid sex until your symptoms are gone.   Avoid caffeine, alcohol, and spicy  foods. These can irritate your bladder.   Urinate right after intercourse to flush out your bladder.   If you use birth control pills and have frequent bladder infections, discuss it with your doctor.  Follow-up care  Call your healthcare provider if all symptoms are not gone after 3 days of treatment. This is especially important if you have repeat infections.  If a culture was done, you will be told if your treatment needs to be changed. If directed, you can callto find out the results.  If X-rays were done, you will be told if the results will affect yourtreatment.  Call 911  Call 911 if any of the following occur:   Trouble breathing   Hard to wake up orconfusion   Fainting or loss of consciousness   Rapid heart rate  When to seek medical advice  Call your healthcare provider right away if any of these occur:   Fever of 100.4F (38.0C) or higher, or as directed by your healthcare provider   Symptoms are not betterby the third day of treatment   Back or belly (abdominal) pain that gets worse   Repeated vomiting, or unable to keep medicine down   Weakness or dizziness   Vaginal discharge   Pain, redness, or swelling in the outer vaginal area (labia)  Date Last Reviewed: 12/01/2014   2000-2016 [employer] [address], [place] [address]. All rights reserved. This information is not intended as a substitute for professional medical care. Always follow your healthcare professional's instructions.

## 2015-10-04 ENCOUNTER — Telehealth (INDEPENDENT_AMBULATORY_CARE_PROVIDER_SITE_OTHER): Payer: Self-pay

## 2015-10-04 NOTE — Telephone Encounter (Signed)
Patient states she is feeling great has no questions or concerns.

## 2015-11-08 ENCOUNTER — Ambulatory Visit (INDEPENDENT_AMBULATORY_CARE_PROVIDER_SITE_OTHER): Payer: MEDICAID | Admitting: OBSTETRICS/GYNECOLOGY

## 2015-11-20 ENCOUNTER — Ambulatory Visit
Admission: RE | Admit: 2015-11-20 | Discharge: 2015-11-20 | Disposition: A | Discharge status: Home / Self Care | Payer: MEDICAID | Source: Ambulatory Visit | Attending: Internal Medicine | Admitting: Internal Medicine

## 2015-11-20 ENCOUNTER — Encounter (HOSPITAL_BASED_OUTPATIENT_CLINIC_OR_DEPARTMENT_OTHER): Payer: Self-pay | Admitting: Hematology & Oncology

## 2015-11-20 ENCOUNTER — Ambulatory Visit (HOSPITAL_BASED_OUTPATIENT_CLINIC_OR_DEPARTMENT_OTHER): Payer: MEDICAID | Admitting: Hematology & Oncology

## 2015-11-20 ENCOUNTER — Ambulatory Visit (HOSPITAL_BASED_OUTPATIENT_CLINIC_OR_DEPARTMENT_OTHER): Payer: MEDICAID

## 2015-11-20 DIAGNOSIS — Z881 Allergy status to other antibiotic agents status: Secondary | ICD-10-CM | POA: Insufficient documentation

## 2015-11-20 DIAGNOSIS — Z888 Allergy status to other drugs, medicaments and biological substances status: Secondary | ICD-10-CM | POA: Insufficient documentation

## 2015-11-20 DIAGNOSIS — Z79899 Other long term (current) drug therapy: Secondary | ICD-10-CM | POA: Insufficient documentation

## 2015-11-20 DIAGNOSIS — Z833 Family history of diabetes mellitus: Secondary | ICD-10-CM | POA: Insufficient documentation

## 2015-11-20 DIAGNOSIS — R87619 Unspecified abnormal cytological findings in specimens from cervix uteri: Secondary | ICD-10-CM | POA: Insufficient documentation

## 2015-11-20 DIAGNOSIS — Z811 Family history of alcohol abuse and dependence: Secondary | ICD-10-CM | POA: Insufficient documentation

## 2015-11-20 DIAGNOSIS — Z800 Family history of malignant neoplasm of digestive organs: Secondary | ICD-10-CM | POA: Insufficient documentation

## 2015-11-20 DIAGNOSIS — IMO0002 Reserved for concepts with insufficient information to code with codable children: Secondary | ICD-10-CM

## 2015-11-20 DIAGNOSIS — Z7982 Long term (current) use of aspirin: Secondary | ICD-10-CM | POA: Insufficient documentation

## 2015-11-20 DIAGNOSIS — Z880 Allergy status to penicillin: Secondary | ICD-10-CM | POA: Insufficient documentation

## 2015-11-20 DIAGNOSIS — Z716 Tobacco abuse counseling: Secondary | ICD-10-CM | POA: Insufficient documentation

## 2015-11-20 DIAGNOSIS — F17210 Nicotine dependence, cigarettes, uncomplicated: Secondary | ICD-10-CM | POA: Insufficient documentation

## 2015-11-20 DIAGNOSIS — R0602 Shortness of breath: Secondary | ICD-10-CM | POA: Insufficient documentation

## 2015-11-20 DIAGNOSIS — Z8249 Family history of ischemic heart disease and other diseases of the circulatory system: Secondary | ICD-10-CM | POA: Insufficient documentation

## 2015-11-20 DIAGNOSIS — R7989 Other specified abnormal findings of blood chemistry: Principal | ICD-10-CM

## 2015-11-20 DIAGNOSIS — R791 Abnormal coagulation profile: Secondary | ICD-10-CM

## 2015-11-20 MED ORDER — NICOTINE 7 MG/24 HR DAILY TRANSDERMAL PATCH
7.0000 mg | MEDICATED_PATCH | Freq: Every day | TRANSDERMAL | 5 refills | Status: AC
Start: 2015-11-20 — End: 2015-12-20

## 2015-11-20 NOTE — Cancer Center Note (Signed)
[facility]  New Patient History and Physical      Date: 11/20/2015  Name: Lindsay Mccullough  MRN: [id number]  Referring Physician: Gean Birchwood, MD  Primary Care Provider: Gean Birchwood, MD    Reason for visit/consultation Blood Disorder      History of Present Illness:  Information Obtained from: patient, mother and history reviewed via medical record  Lindsay Mccullough is a 30 y.o. female with history of serially elevated d dimer since May 2017 without evidence of blood clot. Patient presented to the hospital in May 2017 with significant CP and SOB. States she felt as if a large weight was placed on her chest and was unable to take a deep breath. She was thoroughly worked up for cardiac and pulmonary etiologies, which were all negative. A CT for PE was done which revealed no evidence of PE. She also underwent a b/l LE [place] which revealed no evidence of LE DVT. Patient was negative for pregnancy, and her labs were all within normal limits (with exception of D-dimer). Patient was discharged with resolution of symptoms. During this w/u, however, and elevated D-dimer was noted at 845 on 07/24/15 (ULN 230). Coags were all within normal at that time. A repeat D-dimer was obtained on 08/01/15 which revealed persistent elevation at 644. Repeat testing on 08/30/15 continued to be elevated at 961. She was placed on daily 81 mg ASA and sent for hematologic eval with Dr. Mingo Amber who stated this was non-specific finding. Patient transferred her PCP care to [facility] and therefore asked to be seen here in the hematology clinic to maintain all her care in one facility. She states that since her original episode of SOB, she has intermittent episodes. These usually occur when she is cleaning and exerting herself. She does have a history of asthma as well as being a smoker. She states the attacks have been more frequent since her May episode, however she has also been smoking more and cleaning more to relieve her stress. Patient was on OCP  when she had the episode of SOB in May. She was since taken off of these as she was instructed they could increase her chance of blood clots. She denies any other symptoms such as back pain, urinary sx, constipation, diarrhea, abd pain, CP, HA, changes to vision, weakness or numbness/tingling of one side of body, facial droop, or any other signs of CVA. She denies extremity swelling or pain. Of note, patient has history of two abnormal pap smears. She states he last one was "a long time ago." She denies weight loss/gain, night sweats, fevers, chills, abnormal bleeding from vagina, or pain in pelvic region.        Past Medical History  Past Medical History:   Diagnosis Date    Asthma     Dysplasia of cervix     H/O seasonal allergies          Past Surgical History:   Procedure Laterality Date    HX DILATION AND CURETTAGE      HX NO SURGICAL PROCEDURES           Allergies   Allergen Reactions    Amoxicillin Anaphylaxis    Spironolactone Rash and Hives/ Urticaria    Iodine      Blisters on left arm    Penicillins Hives/ Urticaria     Current Outpatient Prescriptions   Medication Sig    ASPIRIN/ACETAMINOPHEN/CAFFEINE (EXCEDRIN MIGRAINE ORAL) Take by mouth    DM/PSEUDOEPHED/ACETAMINOPH/CPM (TYLENOL COLD ORAL) Take by  mouth    fluticasone (FLONASE) 50 mcg/actuation Nasal Spray, Suspension 1 Spray by Each Nostril route Once a day    MULTIVITS,CA,MINERALS/IRON/FA (ONE-A-DAY WOMENS FORMULA ORAL) Take by mouth    nicotine (NICODERM CQ) 7 mg/24 hr Transdermal Patch 24 hr 1 Patch (7 mg total) by Transdermal route Once a day for 30 days    sertraline (ZOLOFT) 50 mg Oral Tablet Take 50 mg by mouth Once a day       Family History  Family History   Problem Relation Age of Onset    Congestive Heart Failure Mother     Hypertension Mother     High Cholesterol Mother     Heart Attack Mother     Hearing Loss Mother     Heart Disease Mother     Cirrhosis Father     Hypertension Father     High Cholesterol Father       Hypertension Brother     Diabetes Paternal Grandmother     Colon Cancer Other     Pancreatic Cancer Neg Hx     Ovarian Cancer Neg Hx     Osteoporosis Neg Hx     Rectal Cancer Neg Hx     Uterine Fibroids Neg Hx     Uterine Cancer Neg Hx     Clotting Disorder Neg Hx     Breast Cancer Neg Hx     Cervical Cancer Neg Hx          Social History  Occupation:   Social History     Occupational History    Not on file.    Hometown: [place] [address]  Smokes approximately 1/4 ppd for about 17 years. Denies ethanol or drug use.     ROS:   Constitutional: Negative for fatigue, anorexia, weight loss, fevers, chills and sweats  Eyes: negative for visual disturbance and redness  HENT: negative for nasal congestion, sore throat and voice change  Respiratory: + for intermittent SOB; negative for cough, sputum, or DOE  Cardiovascular: negative for CP, palpitations and syncope  Gastrointestinal: negative for nausea, vomiting, diarrhea, constipation and abdominal pain  Integument/breast: negative for rash, skin lesion(s) and pruritus  Hematologic/lymphatic: negative for bleeding and lymphadenopathy  Musculoskeletal:  negative for myalgias, bone/joint pain, and back pain  Neurological: negative for paresthesia, weakness, headaches      Physical Examination:  Most Recent Vitals       Lab from 11/20/2015 in [facility]    Temperature 36.7 C (98.1 F) filed at... 11/20/2015 1150    Heart Rate 87 filed at... 11/20/2015 1150    Respiratory Rate 18 filed at... 11/20/2015 1150    BP (Non-Invasive) 101/66 filed at... 11/20/2015 1150    Height 1.6 m (5\' 3" ) filed at... 11/20/2015 1150    Weight 56.7 kg (125 lb) filed at... 11/20/2015 1150    BMI (Calculated) 22.19 filed at... 11/20/2015 1150    BSA (Calculated) 1.59 filed at... 11/20/2015 1150      ECOG Status: 0 - Fully active, able to carry on all pre-disease performance without restriction.     General: appears in good health and stated age, no acute distress  Eyes:  Conjunctiva clear, PERRL.   HENT: no erythema or injection, mucous membranes moist.  Neck: No lymphadenopathy  Lungs: Clear to auscultation bilaterally. No wheezes/rales/rhonchi.   Cardiovascular: regular rate and rhythm, no murmurs/rubs/gallops  Abdomen: Soft, non-tender, Bowel sounds: Normal  Extremities: No cyanosis or edema  Skin: Skin warm and dry, no  rashes  Neurologic: CN II - XII grossly intact , A&O X 3, normal strength and tone.   Lymphatics: No lymphadenopathy  Psychiatric: Normal affect, behavior, memory, thought content, judgement, and speech.  Access:  No current access site    Labs  No results found for this or any previous visit (from the past 36 hour(s)).      Radiology:  CT for PE 07/25/15  1. No evidence of pulmonary embolism.  2. The lungs are clear.    US Venous Duplex b/l LE 07/24/15  No evidence for deep venous thrombosis in the lower extremities from the level of the common femoral to popliteal veins.      Assessment/Plan:   Ms. Harvest DarkCloud is a 30 y.o. female with history of serially elevated d dimer since May 2017 without evidence of blood clot.       Chronically elevated D dimer  - non-specific, isolated finding in setting or normal coagulation labs  - likely related to inflammation in setting of asthma and smoking  - no need for ppx with ASA or anticoagulation at this time unless indicated for alternative medical reasons  - no evidence of VTE  - w/u neg for pregnancy, DIC, CV disease, infection/SIRS, trauma, liver disease, renal disease  - advised against OCPs at this time as patient is smoker and this does increase her risk of VTE regardless of her D-dimer    Tobacco abuse  - strongly encouraged to quit, patient agreeable  - rx for nictoine patches at lowest dose of 7mg /24hr    Preventive Care  - hx 2 abnormal paps  - encourage gyn exam and PAP      Patient may be discharged from benign hematology clinic. Instructed to call if develops signs or sx of VTE. Call with questions or concerns.               Lequita AsalKathryn Bower, DO  [facility]  Hematology/Oncology Fellow, PGY-V  Pager: [phone]    I performed a history and physical exam of the patient and discussed management with the resident. I reviewed the resident's note and agree with the documented findings and plan of care. Any exceptions are edited/noted.     Addyson Traub A. Sherryll BurgerShah, M.D.  Assistant Professor  Section of Hematology/Oncology        CC:  Gean BirchwoodPhilomela Tabuena, MD  [address]  [place] [phone]    No primary care provider on file.

## 2015-12-19 ENCOUNTER — Encounter (INDEPENDENT_AMBULATORY_CARE_PROVIDER_SITE_OTHER): Payer: Self-pay

## 2015-12-19 ENCOUNTER — Ambulatory Visit (INDEPENDENT_AMBULATORY_CARE_PROVIDER_SITE_OTHER): Payer: Medicaid (Managed Care) | Admitting: Family

## 2015-12-19 VITALS — BP 123/89 | HR 93 | Temp 98.4°F | Resp 18 | Ht 63.0 in | Wt 126.9 lb

## 2015-12-19 DIAGNOSIS — L03221 Cellulitis of neck: Secondary | ICD-10-CM

## 2015-12-19 MED ORDER — MUPIROCIN 2 % EX OINT
TOPICAL_OINTMENT | Freq: Two times a day (BID) | CUTANEOUS | 0 refills | Status: DC
Start: 2015-12-19 — End: 2016-01-03

## 2015-12-19 MED ORDER — SULFAMETHOXAZOLE-TRIMETHOPRIM 800-160 MG PO TABS
1.0000 | ORAL_TABLET | Freq: Two times a day (BID) | ORAL | 0 refills | Status: AC
Start: 2015-12-19 — End: 2015-12-26

## 2015-12-19 NOTE — Progress Notes (Signed)
Subjective:    Patient ID: Regina Adams is a 30 y.o. female.    Woke up with a rash on the right side of her neck 2 days ago  Started as a very red rash.  Reports burning and itching.  Baking soda, desitin diaper rash cream, alcohol, cortisone cream, calamine lotion.  States she had a 24 hour stomach viral that is now resolved as of yesterday.  Denies any soap/detergent changes. Went to the pumpkin patch the day before it started. No one else in the family has the rash.  Reports she has to shave her neck because of hair. 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      Rash   This is a new problem. Episode onset: 2 days. Location: right neck. The rash is characterized by burning and itchiness. Pertinent negatives include no anorexia, congestion, cough, diarrhea, eye pain, facial edema, fatigue, fever, joint pain, nail changes, rhinorrhea, shortness of breath, sore throat or vomiting. The treatment provided mild relief. There is no history of allergies, asthma, eczema or varicella.       The following portions of the patient's history were reviewed and updated as appropriate: allergies, current medications, past family history, past medical history, past social history, past surgical history and problem list.    Review of Systems   Constitutional: Negative for fatigue and fever.   HENT: Negative for congestion, rhinorrhea and sore throat.    Eyes: Negative for pain.   Respiratory: Negative for cough and shortness of breath.    Cardiovascular: Negative for chest pain.   Gastrointestinal: Negative for anorexia, diarrhea, nausea and vomiting.   Genitourinary: Negative for difficulty urinating.   Musculoskeletal: Negative for arthralgias, joint pain and myalgias.   Skin: Positive for rash. Negative for color change, nail changes, pallor and wound.   Neurological: Negative for numbness.   Hematological: Does not bruise/bleed easily.   All other systems reviewed and are negative.        Objective:    BP 123/89 (BP Site:  Left arm, Patient Position: Sitting)   Pulse 93   Temp 98.4 F (36.9 C) (Oral)   Resp 18   Ht 1.6 m (5\' 3" )   Wt 57.6 kg (126 lb 14.4 oz)   LMP 11/22/2015   BMI 22.48 kg/m     Physical Exam   Constitutional: She is oriented to person, place, and time. Vital signs are normal. She appears well-developed and well-nourished.  Non-toxic appearance. She does not have a sickly appearance. She does not appear ill. No distress.   HENT:   Head:       Eyes: Right eye exhibits no discharge. Left eye exhibits no discharge.   Neck: Normal range of motion and full passive range of motion without pain. Neck supple.   Cardiovascular: Normal rate, regular rhythm and normal heart sounds.  Exam reveals no gallop, no S3, no distant heart sounds and no friction rub.    No murmur heard.  Pulmonary/Chest: Effort normal and breath sounds normal. No accessory muscle usage. No tachypnea. No respiratory distress. She has no decreased breath sounds. She has no wheezes. She has no rhonchi. She has no rales.   Abdominal: Normal appearance.   Neurological: She is alert and oriented to person, place, and time. She is not disoriented. She exhibits normal muscle tone. GCS eye subscore is 4. GCS verbal subscore is 5. GCS motor subscore is 6.   Skin: Skin is warm, dry and intact. No rash noted. She  is not diaphoretic. No pallor.   Psychiatric: She has a normal mood and affect.   Nursing note and vitals reviewed.        Assessment and Plan:       Regina Adams was seen today for rash.    Diagnoses and all orders for this visit:    Cellulitis of neck  -     sulfamethoxazole-trimethoprim (BACTRIM DS) 800-160 MG per tablet; Take 1 tablet by mouth 2 (two) times daily.for 7 days  -     mupirocin (BACTROBAN) 2 % ointment; Apply topically 2 (two) times daily.      -No discharge to collect wound culture.  -Bactrim for cellulitis. Hx of allergy to PCN.  -Bactroban BID and PRN.  -Cleans with soap and water.  -Watch for increase pain, redness, swelling,  streaking, fever, nausea, vomiting, diarrhea. RTC or go to the ED if this occurs.  -Declines work note.    Go to the ER for any new or worsening symptoms that concern you.  Follow-up with your primary care doctor or return to Urgent Care if your symptoms do not improve.    Patient agrees with the plan.        Carolin Sicks, NP  [facility]  12/19/2015  11:46 AM

## 2015-12-19 NOTE — Patient Instructions (Signed)
Cellulitis  Cellulitis is an infection of the deep layers of skin. A break in the skin, such as a cut or scratch, can let bacteria under the skin. If the bacteria get to deep layers of the skin, it can be serious. If not treated, cellulitis can get into the bloodstream and lymph nodes. The infection can then spread throughout the body. This causes serious illness.  Cellulitis causes the affected skin to become red, swollen, warm, and sore. The reddened areas have a visible border. An open sore may leak fluid (pus). You may have a fever, chills, and pain.  Cellulitis is treated with antibiotics taken for 7 to 10 days. An open sore may be cleaned and covered with cool wet gauze. Symptoms should get better 1 to 2 days after treatment is started. Make sure to take all the antibiotics for the full number of days until they are gone. Keep taking the medicine even if your symptoms go away.  Home care  Follow these tips:   Limit the use of the part of your body with cellulitis.   If the infection is on your leg, keep your leg raised while sitting. This will help to reduce swelling.   Take all of the antibiotic medicine exactly as directed until it is gone. Do not miss any doses, especially during the first 7 days. Don't stop taking the medicine when your symptoms get better.   Keep the affected area clean and dry.   Wash your hands with soap and warm water before and after touching your skin. Anyone else who touches your skin should also wash his or her hands. Don't share towels.  Follow-up care  Follow up with your healthcare provider, or as advised. If your infection does not go away on the first antibiotic, your healthcare provider will prescribe a different one.  When to seek medical advice  Call your healthcare provider right away if any of these occur:   Red areas that spread   Swelling or pain that gets worse   Fluid leaking from the skin (pus)   Fever higher of 100.4 F (38.0 C) or higher after 2 days  on antibiotics  Date Last Reviewed: 11/01/2014   2000-2016 [employer] [address], [place] [address]. All rights reserved. This information is not intended as a substitute for professional medical care. Always follow your healthcare professional's instructions.

## 2015-12-22 ENCOUNTER — Telehealth (INDEPENDENT_AMBULATORY_CARE_PROVIDER_SITE_OTHER): Payer: Self-pay

## 2015-12-22 NOTE — Telephone Encounter (Signed)
Follow up call made. Left message with patient to give us a call back with any questions or concerns

## 2016-01-03 ENCOUNTER — Ambulatory Visit (INDEPENDENT_AMBULATORY_CARE_PROVIDER_SITE_OTHER): Payer: Medicaid (Managed Care) | Admitting: Family

## 2016-01-03 ENCOUNTER — Encounter (INDEPENDENT_AMBULATORY_CARE_PROVIDER_SITE_OTHER): Payer: Self-pay

## 2016-01-03 VITALS — BP 107/73 | HR 82 | Temp 98.6°F | Resp 16 | Ht 63.0 in | Wt 125.3 lb

## 2016-01-03 DIAGNOSIS — R05 Cough: Secondary | ICD-10-CM

## 2016-01-03 DIAGNOSIS — R0981 Nasal congestion: Secondary | ICD-10-CM

## 2016-01-03 DIAGNOSIS — J0140 Acute pansinusitis, unspecified: Secondary | ICD-10-CM

## 2016-01-03 DIAGNOSIS — R059 Cough, unspecified: Secondary | ICD-10-CM

## 2016-01-03 MED ORDER — FLUTICASONE PROPIONATE 50 MCG/ACT NA SUSP
1.0000 | Freq: Every day | NASAL | 2 refills | Status: AC
Start: 2016-01-03 — End: 2017-01-02

## 2016-01-03 MED ORDER — DOXYCYCLINE HYCLATE 100 MG PO TABS
100.0000 mg | ORAL_TABLET | Freq: Two times a day (BID) | ORAL | 0 refills | Status: AC
Start: 2016-01-03 — End: 2016-01-13

## 2016-01-03 MED ORDER — BENZONATATE 100 MG PO CAPS
100.0000 mg | ORAL_CAPSULE | Freq: Three times a day (TID) | ORAL | 1 refills | Status: DC | PRN
Start: 2016-01-03 — End: 2016-07-30

## 2016-01-03 NOTE — Patient Instructions (Addendum)
Take  your medication  Increase fluid intake  Antipyretics for any pain or fever  -Follow up with PCP if symptoms lasting longer than expected  -RTC or ER if new/worsening symptoms  -Pt/guardian agreed to plan    Sinusitis (Antibiotic Treatment)    The sinuses are air-filled spaces within the bones of the face. They connect to the inside of the nose.Sinusitisis an inflammation of the tissue lining the sinus cavity. Sinus inflammation can occur during a cold. It can also be due to allergies to pollens and other particles in the air. Sinusitis can cause symptoms of sinus congestion and fullness. A sinus infection causes fever, headache and facial pain. There is often green or yellow drainage from the nose or into the back of the throat (post-nasal drip). You have been given antibiotics to treat this condition.  Home care:   Take the full course of antibiotics as instructed. Do not stop taking them, even if you feel better.   Drink plenty of water, hot tea, and other liquids. This may help thin mucus. It also may promote sinus drainage.   Heat may help soothe painful areas of the face. Use a towel soaked in hot water. Or, stand in the shower and direct the hot spray onto your face. Using a vaporizer along with a menthol rub at night may also help.   Anexpectorantcontaining guaifenesin may help thin the mucus and promote drainage from the sinuses.   Over-the-counterdecongestantsmay be used unless a similar medicine was prescribed. Nasal sprays work the fastest. Use one that contains phenylephrine or oxymetazoline. First blow the nose gently. Then use the spray. Do not use these medicines more often than directed on the label or symptoms may get worse. You may also use tablets containing pseudoephedrine. Avoid products that combine ingredients, because side effects may be increased. Read labels. You can also ask the pharmacist for help. (NOTE:Persons with high blood pressure should not use decongestants.  They can raise blood pressure.)   Over-the-counterantihistaminesmay help if allergies contributed to your sinusitis.    Do not use nasal rinses or irrigation during an acute sinus infection, unless told to by your health care provider. Rinsing may spread the infection to other sinuses.   Use acetaminophen or ibuprofen to control pain, unless another pain medicine was prescribed. (If you have chronic liver or kidney disease or ever had a stomach ulcer, talk with your doctor before using these medicines. Aspirin should never be used in anyone under 18 years of age who is ill with a fever. It may cause severe liver damage.)   Don't smoke. This can worsen symptoms.  Follow-up care  Follow up with your healthcare provider or our staff if you are not improving within the next week.  When to seek medical advice  Call your healthcare provider if any of these occur:   Facial pain or headache becoming more severe   Stiff neck   Unusual drowsiness or confusion   Swelling of the forehead or eyelids   Vision problems, including blurred or double vision   Fever of100.4F (38C)or higher, or as directed by your healthcare provider   Seizure   Breathing problems   Symptoms not resolving within 10 days  Date Last Reviewed: 06/12/2013   2000-2016 [employer] [address], [place] [address]. All rights reserved. This information is not intended as a substitute for professional medical care. Always follow your healthcare professional's instructions.

## 2016-01-03 NOTE — Progress Notes (Signed)
Subjective:    Patient ID: Regina Adams is a 30 y.o. female.    Patient states sore throat started after the cough      Cough   This is a new problem. The current episode started in the past 7 days. The problem has been unchanged. Episode frequency: intermittent. The cough is non-productive. Associated symptoms include nasal congestion, rhinorrhea and a sore throat. Associated symptoms comments: Sinus pressure. Nothing aggravates the symptoms. Treatments tried: otc meds. The treatment provided mild relief.       The following portions of the patient's history were reviewed and updated as appropriate: allergies, current medications, past family history, past medical history, past social history, past surgical history and problem list.    Review of Systems   HENT: Positive for rhinorrhea, sinus pressure and sore throat.    Respiratory: Positive for cough.    All other systems reviewed and are negative.        Objective:    BP 107/73 (BP Site: Left arm, Patient Position: Sitting, Cuff Size: Medium)   Pulse 82   Temp 98.6 F (37 C) (Oral)   Resp 16   Ht 1.6 m (5\' 3" )   Wt 56.8 kg (125 lb 4.8 oz)   LMP 12/26/2015 (Exact Date)   SpO2 99%   BMI 22.20 kg/m     Physical Exam   Constitutional: She is oriented to person, place, and time. Vital signs are normal. She appears well-developed and well-nourished. No distress.   HENT:   Head: Normocephalic and atraumatic.   Right Ear: Hearing, tympanic membrane, external ear and ear canal normal.   Left Ear: Hearing, tympanic membrane, external ear and ear canal normal.   Nose: Rhinorrhea present. Right sinus exhibits maxillary sinus tenderness and frontal sinus tenderness. Left sinus exhibits maxillary sinus tenderness and frontal sinus tenderness.   Mouth/Throat: Uvula is midline and mucous membranes are normal. Oropharyngeal exudate and posterior oropharyngeal erythema present.   Eyes: Pupils are equal, round, and reactive to light.   Neck: Neck supple.    Cardiovascular: Normal rate, regular rhythm and normal heart sounds.    No murmur heard.  Pulmonary/Chest: Effort normal and breath sounds normal. No respiratory distress.   Neurological: She is oriented to person, place, and time.   Skin: Skin is warm and dry. Capillary refill takes less than 2 seconds. She is not diaphoretic.   Nursing note and vitals reviewed.     Labs  No results found for this or any previous visit (from the past 24 hour(s)).    Radiology  No results found.        Assessment and Plan:       Regina Adams was seen today for cough and congestion.    Diagnoses and all orders for this visit:    Acute pansinusitis, recurrence not specified  -     doxycycline (VIBRA-TABS) 100 MG tablet; Take 1 tablet (100 mg total) by mouth 2 (two) times daily.for 10 days    Cough  -     benzonatate (TESSALON PERLES) 100 MG capsule; Take 1 capsule (100 mg total) by mouth 3 (three) times daily as needed for Cough.    Sinus congestion  -     fluticasone (FLONASE) 50 MCG/ACT nasal spray; 1 spray by Nasal route daily.    Take  your medication  Increase fluid intake  Antipyretics for any pain or fever  -Follow up with PCP if symptoms lasting longer than expected  -RTC or ER if  new/worsening symptoms  -Pt/guardian agreed to plan          Etter Sjogren, NP  [facility]  01/03/2016  2:21 PM

## 2016-01-06 ENCOUNTER — Telehealth (INDEPENDENT_AMBULATORY_CARE_PROVIDER_SITE_OTHER): Payer: Self-pay

## 2016-01-06 NOTE — Telephone Encounter (Signed)
Performed patient call back; left message for patient to call back with questions/concerns.

## 2016-01-13 ENCOUNTER — Ambulatory Visit (INDEPENDENT_AMBULATORY_CARE_PROVIDER_SITE_OTHER): Payer: MEDICAID | Admitting: OBSTETRICS/GYNECOLOGY

## 2016-01-13 ENCOUNTER — Other Ambulatory Visit (HOSPITAL_BASED_OUTPATIENT_CLINIC_OR_DEPARTMENT_OTHER): Payer: MEDICAID | Attending: OBSTETRICS/GYNECOLOGY

## 2016-01-13 ENCOUNTER — Encounter (INDEPENDENT_AMBULATORY_CARE_PROVIDER_SITE_OTHER): Payer: Self-pay | Admitting: OBSTETRICS/GYNECOLOGY

## 2016-01-13 VITALS — BP 104/62 | HR 111 | Temp 97.9°F | Ht 63.0 in | Wt 126.0 lb

## 2016-01-13 DIAGNOSIS — Z3009 Encounter for other general counseling and advice on contraception: Secondary | ICD-10-CM

## 2016-01-13 DIAGNOSIS — Z124 Encounter for screening for malignant neoplasm of cervix: Principal | ICD-10-CM | POA: Insufficient documentation

## 2016-01-13 DIAGNOSIS — Z01419 Encounter for gynecological examination (general) (routine) without abnormal findings: Secondary | ICD-10-CM

## 2016-01-13 DIAGNOSIS — Z0000 Encounter for general adult medical examination without abnormal findings: Secondary | ICD-10-CM

## 2016-01-13 NOTE — Progress Notes (Signed)
[place] [facility]      ANNUAL EXAM    PATIENT: Lindsay Mccullough  CHART NUMBER: [id number]  DATE OF SERVICE: 01/13/2016      CC: annual exam    HPI: Lindsay Mccullough is a 30 y.o. [phone] who presents to clinic for an annual exam. Patient is doping well today with no complaints. She was previously on sprintec for contraception, she is considering an IUD. She denies any significant changes to her medical history      HEALTH MAINTENANCE:  Diet/nutrition: Eats lots of "junk food"  Activity/exercise: No regular exercise   Social issues: Safe   STD screening: Patient declines, denies STD history  Last pap: ?2013  Pregnancy plans: States done with child bearing     REVIEW OF SYSTEMS:   See HPI, all other system negative    OBSTETRICAL HISTORY:  Obstetric History    G4   P3   T3   P0   A1   L3     SAB0   TAB0   Ectopic0   Multiple0   Live Births3       # Outcome Date GA Lbr Len/2nd Weight Sex Delivery Anes PTL Lv   4 SAB            3 Term     F Vag-Spont   LIV   2 Term     M Vag-Spont   LIV   1 Term     M Vag-Spont   LIV          GYNECOLOGIC HISTORY:  Menses: Q 28 days, light bleeding    Last pap: 2013? Patient unsure   H/o abnormal paps: Denies   H/o STIs: Denies   Sexually active: Yes with stable partner   New partner: Denies   Contraception: None currently   Urinary incontinence: Denies     PAST MEDICAL HISTORY:  Past Medical History:   Diagnosis Date    Asthma     Dysplasia of cervix     H/O seasonal allergies            PAST SURGICAL HISTORY:  Past Surgical History:   Procedure Laterality Date    HX DILATION AND CURETTAGE      HX NO SURGICAL PROCEDURES             FAMILY HISTORY:  Family Medical History     Problem Relation (Age of Onset)    Cirrhosis Father    Colon Cancer Other    Congestive Heart Failure Mother    Diabetes Paternal Grandmother    Hearing Loss Mother    Heart Attack Mother    Heart Disease Mother    High Cholesterol Mother, Father    Hypertension Mother, Father, Brother                SOCIAL HISTORY:  Social History     Social History    Marital status: Divorced     Spouse name: N/A    Number of children: N/A    Years of education: N/A     Occupational History    Not on file.     Social History Main Topics    Smoking status: Former Smoker     Packs/day: 0.25     Start date: 11/20/1998     Quit date: 10/13/2015    Smokeless tobacco: Never Used    Alcohol use No    Drug use: Not on file    Sexual activity:  Not on file     Other Topics Concern    Breast Self Exam No     Social History Narrative        MEDICATIONS:     Outpatient Medications Prior to Visit:  ASPIRIN/ACETAMINOPHEN/CAFFEINE (EXCEDRIN MIGRAINE ORAL) Take by mouth   DM/PSEUDOEPHED/ACETAMINOPH/CPM (TYLENOL COLD ORAL) Take by mouth   fluticasone (FLONASE) 50 mcg/actuation Nasal Spray, Suspension 1 Spray by Each Nostril route Once a day   MULTIVITS,CA,MINERALS/IRON/FA (ONE-A-DAY WOMENS FORMULA ORAL) Take by mouth   sertraline (ZOLOFT) 50 mg Oral Tablet Take 100 mg by mouth Once a day      No facility-administered medications prior to visit.     ALLERGIES:  Amoxicillin; Spironolactone; Iodine; and Penicillins       PHYSICAL EXAMINATION:   BP 104/62   Pulse (!) 111   Temp 36.6 C (97.9 F) (Oral)    Ht 1.6 m (5\' 3" )   Wt 57.2 kg (126 lb)   LMP 12/26/2015 (Exact Date)   BMI 22.32 kg/m2  General: Pleasant female in no acute distress.  Skin:  Intact, without lesions.  HEENT: NCAT, EOMT, without lesions.  Neck/Thyroid: no masses or thyromegaly.  Lungs: Clear to auscultation bilaterally.  Cardiac: Regular rate and rhythm, without murmur, rub, or gallop.  Abdomen: Soft, NT, ND, no masses.  Extremities: No cyanosis, clubbing, or edema.  Neurologic: Alert and Oriented, no focal deficit  Psychiatric: Mood and affect normal.      Gynecologic:  Breasts: supple, no masses or nodes  External Genitalia: no lesions  Urethra: no lesions  Vulva: no lesions  Vagina: no lesions  Cervix: no lesions or CMT, nabothian cyst at 10 o'clock  Uterus:  Anterverted, normal contour and size  Adnexa: no palpable masses  Culdesac: smooth  Rectal: not examined      ASSESSMENT/PLAN: 30 y.o. [id number] for annual exam.    1. Health maintenance: Pap done today, encouraged healthy eating, encouraged continued breast self awarness   2. Contraception: Discussed IUD, patient considering     Follow up appointment in approximately 1 year for annual or sooner for Mirena placement.      Dondra SpryMichaela Lamonde, DO 01/13/2016 15:08  PGY-2  [facility]  [facility]      I saw and examined the patient. I reviewed the resident's note. I agree with the findings and plan of care as documented in the resident's note. Any exceptions/additions are edited/noted. Anderson MaltaJessica M Estefanny Moler, DO 01/13/2016, 17:29

## 2016-01-16 LAB — HPV RNA, HIGH RISK - BMC/JMC ONLY: HPV RNA PCR: POSITIVE — AB

## 2016-01-16 LAB — HISTORICAL CYTOPATHOLOGY-GYN (PAP AND HPV TESTS)

## 2016-01-28 ENCOUNTER — Other Ambulatory Visit (INDEPENDENT_AMBULATORY_CARE_PROVIDER_SITE_OTHER): Payer: Self-pay | Admitting: OBSTETRICS/GYNECOLOGY

## 2016-01-28 DIAGNOSIS — Z124 Encounter for screening for malignant neoplasm of cervix: Secondary | ICD-10-CM

## 2016-01-31 LAB — HPV GENOTYPING - BMC/JMC ONLY
HPV 18/45 PCR: NEGATIVE
HPV16 PCR: NEGATIVE

## 2016-02-05 ENCOUNTER — Telehealth (INDEPENDENT_AMBULATORY_CARE_PROVIDER_SITE_OTHER): Payer: Self-pay | Admitting: OBSTETRICS/GYNECOLOGY

## 2016-02-05 NOTE — Telephone Encounter (Signed)
Pt called into office stating that she has been having c/o hot/cold flashes for 2-3 months. I asked pt if she had brought up the issue at her appt in Nov w/ Dr. Owens SharkHott pt states that it slipped her mind. Pt thought that the Zoloft Rx she is in could be giving her the issue but she talked to the pharmacy and told her that it was not the Rx. Pt scheduled appt on 03/12/2016 w/ Dr. Owens SharkHott. ....Marland Kitchen.Floyde ParkinsKelsey Lanice Folden, CMA  02/05/2016, 11:17

## 2016-03-06 ENCOUNTER — Ambulatory Visit (INDEPENDENT_AMBULATORY_CARE_PROVIDER_SITE_OTHER): Payer: MEDICAID | Admitting: OBSTETRICS/GYNECOLOGY

## 2016-03-09 ENCOUNTER — Encounter (HOSPITAL_BASED_OUTPATIENT_CLINIC_OR_DEPARTMENT_OTHER): Payer: Self-pay

## 2016-03-12 ENCOUNTER — Ambulatory Visit (INDEPENDENT_AMBULATORY_CARE_PROVIDER_SITE_OTHER): Payer: MEDICAID | Admitting: OBSTETRICS/GYNECOLOGY

## 2016-03-26 ENCOUNTER — Encounter (INDEPENDENT_AMBULATORY_CARE_PROVIDER_SITE_OTHER): Payer: Self-pay | Admitting: OBSTETRICS/GYNECOLOGY

## 2016-03-26 ENCOUNTER — Other Ambulatory Visit (HOSPITAL_BASED_OUTPATIENT_CLINIC_OR_DEPARTMENT_OTHER): Payer: MEDICAID | Attending: OBSTETRICS/GYNECOLOGY

## 2016-03-26 ENCOUNTER — Ambulatory Visit (INDEPENDENT_AMBULATORY_CARE_PROVIDER_SITE_OTHER): Payer: MEDICAID | Admitting: OBSTETRICS/GYNECOLOGY

## 2016-03-26 ENCOUNTER — Other Ambulatory Visit (HOSPITAL_COMMUNITY): Payer: Self-pay | Admitting: OBSTETRICS/GYNECOLOGY

## 2016-03-26 VITALS — BP 104/62 | Ht 63.0 in

## 2016-03-26 DIAGNOSIS — E282 Polycystic ovarian syndrome: Secondary | ICD-10-CM

## 2016-03-26 DIAGNOSIS — N870 Mild cervical dysplasia: Secondary | ICD-10-CM

## 2016-03-26 DIAGNOSIS — R87810 Cervical high risk human papillomavirus (HPV) DNA test positive: Secondary | ICD-10-CM | POA: Insufficient documentation

## 2016-03-26 DIAGNOSIS — B977 Papillomavirus as the cause of diseases classified elsewhere: Secondary | ICD-10-CM

## 2016-03-26 DIAGNOSIS — R87610 Atypical squamous cells of undetermined significance on cytologic smear of cervix (ASC-US): Secondary | ICD-10-CM

## 2016-03-26 DIAGNOSIS — N871 Moderate cervical dysplasia: Secondary | ICD-10-CM

## 2016-03-26 NOTE — Patient Instructions (Signed)
Call your doctor if you have have any concerns. Call if your vaginal bleeding is heavy and soaking more than one pad per hour for several hours, if you have any foul smelling or green vaginal discharge, if you have a fever (Temp higher than 100.4F).     You will have vaginal discharge that can be pink/tan/brown/ or a watery vaginal discharge for 1-2 weeks. You may also pass "coffee ground" looking material for a few days. This is normal.

## 2016-03-27 NOTE — Procedures (Signed)
Name of Patient: Lindsay PeaksJessica Mccullough  DOB: 06/29/1985  MRN: [id number]    Date of Service: 03/26/2016        PROCEDURE:     COLPOSCOPY     Informed consent was obtained for colposcopy and possible vaginal/cervical biopsies/ECC prior to the start of the procedure. The patient verbalizes a reasonable understanding of her diagnosis and is in agreement with the plan of care.  All risks, benefits and alternatives were reviewed with the patient.  All questions were answered.     Indication:   ASCUS Pap + HR HPV     Visible Lesion    YES    Lesion Location(s):  Circumferential around the os     Lesion Description:   Acetowhite Changes  PRESENT      Punctuation   ABSENT      Raised Borders   ABSENT      Mosaicism    PRESENT      Abnormal Blood Vessels  ABSENT    Satisfactory Evaluation:  NO entire transformation zone was not seen      Biopsies Taken:   YES              CERVICAL/ECC        IMPRESSION:  MILD/MODERATE DYSPLASIA         COMMENTS:   Hemostasis achieved with silver nitrate    FOLLOW-UP PLAN:      Post-procedure instructions reviewed  Follow-up in 2-3 weeks to review pathology and plan of care.

## 2016-03-30 LAB — HISTORICAL SURGICAL PATHOLOGY SPECIMEN

## 2016-04-01 ENCOUNTER — Telehealth (INDEPENDENT_AMBULATORY_CARE_PROVIDER_SITE_OTHER): Payer: Self-pay | Admitting: OBSTETRICS/GYNECOLOGY

## 2016-04-01 NOTE — Telephone Encounter (Signed)
Pt called and said she wanted to know her bx results.        Gwyneth Sprout.Delwyn Scoggin

## 2016-04-13 ENCOUNTER — Encounter (INDEPENDENT_AMBULATORY_CARE_PROVIDER_SITE_OTHER): Payer: Self-pay | Admitting: OBSTETRICS/GYNECOLOGY

## 2016-04-13 ENCOUNTER — Ambulatory Visit (INDEPENDENT_AMBULATORY_CARE_PROVIDER_SITE_OTHER): Payer: MEDICAID | Admitting: OBSTETRICS/GYNECOLOGY

## 2016-04-13 VITALS — BP 96/62 | HR 81 | Temp 98.9°F | Ht 63.0 in | Wt 128.0 lb

## 2016-04-13 DIAGNOSIS — Z9889 Other specified postprocedural states: Secondary | ICD-10-CM

## 2016-04-13 DIAGNOSIS — N870 Mild cervical dysplasia: Principal | ICD-10-CM

## 2016-04-13 NOTE — Progress Notes (Signed)
[facility]  [address]  [place], [place]  [address]  [phone]        Name of Patient: Lindsay Mccullough  DOB: 06/04/1985  MRN: [id number]    Date of Service: 04/13/2016        Subjective:    31 y.o. [id number]     Chief Complaint   Patient presents with    Follow Up Colpo     Patient is here for follow-up after colposcopy.  She had an abnormal Pap smear ASCUS with positive high-risk HPV.  She has no complaints today.  She is still smoking.      REVIEW OF SYSTEMS :    Constitutional: Negative. Denies any fever, chills or fatigue   Gastrointestinal: Negative.  Denies any change in bowel habits, nausea or vomiting  Genitourinary:        See HPI     Patient's PMH/PSH/FH/SH/Meds/Allergies were all reviewed and noted in the EPIC chart on today's visit.    Past Medical History:   Diagnosis Date    Asthma     Dysplasia of cervix     H/O seasonal allergies          Past Surgical History:   Procedure Laterality Date    HX DILATION AND CURETTAGE      HX NO SURGICAL PROCEDURES           Family Medical History     Problem Relation (Age of Onset)    Cirrhosis Father    Colon Cancer Other    Congestive Heart Failure Mother    Diabetes Paternal Grandmother    Hearing Loss Mother    Heart Attack Mother    Heart Disease Mother    High Cholesterol Mother, Father    Hypertension Mother, Father, Brother            Current Outpatient Prescriptions   Medication Sig Dispense Refill    ASPIRIN/ACETAMINOPHEN/CAFFEINE (EXCEDRIN MIGRAINE ORAL) Take by mouth      DM/PSEUDOEPHED/ACETAMINOPH/CPM (TYLENOL COLD ORAL) Take by mouth      fluticasone (FLONASE) 50 mcg/actuation Nasal Spray, Suspension 1 Spray by Each Nostril route Once a day      MULTIVITS,CA,MINERALS/IRON/FA (ONE-A-DAY WOMENS FORMULA ORAL) Take by mouth      sertraline (ZOLOFT) 50 mg Oral Tablet Take 100 mg by mouth Once a day       ZOLPIDEM TARTRATE (AMBIEN ORAL) Take by mouth       No current facility-administered medications for this  visit.      Allergies   Allergen Reactions    Amoxicillin Anaphylaxis    Spironolactone Rash and Hives/ Urticaria    Iodine      Blisters on left arm    Penicillins Hives/ Urticaria     Social History     Social History    Marital status: Divorced     Spouse name: N/A    Number of children: N/A    Years of education: N/A     Occupational History    Not on file.     Social History Main Topics    Smoking status: Former Smoker     Packs/day: 0.25     Start date: 11/20/1998     Quit date: 10/13/2015    Smokeless tobacco: Never Used    Alcohol use No    Drug use: Not on file    Sexual activity: Not on file     Other Topics Concern    Breast Self  Exam No     Social History Narrative           Objective:  BP 96/62   Pulse 81   Temp 37.2 C (98.9 F) (Oral)    Ht 1.6 m (5\' 3" )   Wt 58.1 kg (128 lb)   LMP 04/04/2016 (Exact Date)   BMI 22.67 kg/m2  Physical Exam:    Constitutional: She is oriented. She appears well-developed and well-nourished.     HENT: Head: Normocephalic.      Abdomen: She exhibits no distension. Soft. No tenderness. She has no rebound and no guarding.     Psychiatric: She has a normal mood and affect. Her behavior is normal.     Extremities:  No edema, no calf tenderness bilaterally    Skin:  Normal, no rashes or skin lesions.            Final Pathologic Diagnosis    A. 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    B. UTERUS, CERVIX, 11:00, BIOPSY:  -   Nuclear alterations suggestive of HPV infection are present.    C. UTERUS, CERVIX, 12:00, BIOPSY:  -   No diagnostic abnormality recognized.    D. 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  -   Florid squamous metaplasia.    E. UTERUS, ENDOCERVIX, CURETTAGE:  -   Endocervical tissue with no diagnostic abnormality recognized.  -   Exocervical tissue with low grade squamous intraepithelial lesion (CIN I).            Assessment &  Plan:     ICD-10-CM    1. 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 Status post colposcopy Z98.890        Reviewed pathology.  Reviewed pathophysiology of CIN 1.  Reviewed ASCCP guidelines and recommend repeat Pap and HPV co-testing in 1 year.    The patient verbalizes a reasonable understanding of her diagnosis and is in agreement with the plan of care.  All risks, benefits and alternatives were reviewed with the patient.  All questions were answered.       Greater that 50% of a 15 minute appointment was spent in consultation.         No orders of the defined types were placed in this encounter.

## 2016-04-27 ENCOUNTER — Ambulatory Visit: Payer: MEDICAID

## 2016-04-27 ENCOUNTER — Ambulatory Visit (HOSPITAL_BASED_OUTPATIENT_CLINIC_OR_DEPARTMENT_OTHER): Payer: MEDICAID

## 2016-04-27 ENCOUNTER — Other Ambulatory Visit (HOSPITAL_BASED_OUTPATIENT_CLINIC_OR_DEPARTMENT_OTHER): Payer: Self-pay | Admitting: Family Medicine

## 2016-04-27 ENCOUNTER — Ambulatory Visit
Admission: RE | Admit: 2016-04-27 | Discharge: 2016-04-27 | Disposition: A | Discharge status: Home / Self Care | Payer: MEDICAID | Source: Ambulatory Visit | Attending: Family Medicine | Admitting: Family Medicine

## 2016-04-27 DIAGNOSIS — N390 Urinary tract infection, site not specified: Secondary | ICD-10-CM

## 2016-04-27 DIAGNOSIS — M549 Dorsalgia, unspecified: Secondary | ICD-10-CM | POA: Insufficient documentation

## 2016-04-29 ENCOUNTER — Other Ambulatory Visit (HOSPITAL_BASED_OUTPATIENT_CLINIC_OR_DEPARTMENT_OTHER): Payer: Self-pay | Admitting: Family Medicine

## 2016-04-29 DIAGNOSIS — M545 Low back pain, unspecified: Secondary | ICD-10-CM

## 2016-04-29 LAB — URINE CULTURE: URINE CULTURE: 60000 — AB

## 2016-05-06 ENCOUNTER — Ambulatory Visit (HOSPITAL_BASED_OUTPATIENT_CLINIC_OR_DEPARTMENT_OTHER)
Admission: RE | Admit: 2016-05-06 | Discharge: 2016-05-06 | Disposition: A | Discharge status: Still In Hospital | Payer: MEDICAID | Source: Ambulatory Visit | Attending: Family Medicine | Admitting: Family Medicine

## 2016-05-06 DIAGNOSIS — M545 Low back pain, unspecified: Secondary | ICD-10-CM
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
## 2016-05-06 NOTE — PT Treatment (Signed)
[facility]  [place], [place] [id number]     Outpatient Rehabilitation Services  Physical Therapy Progress Note    Patient Name: Lindsay PeaksJessica Mccullough  Date of Birth: 12/31/1985  MRN: [id number]  Payor: [place] [employer] / Plan: [place] [employer] / Product Type: Medicaid [place] /   Diagnosis:  Lumbar pain.  Recertification Date: 06/06/16.  Referring provider:  Michelle Piperabeuna.  Next Physician Follow-up Visit: ?    05/06/2016 is the patient's 1st visit      Subjective       Objective       HP/ES  Table   LTR  anytype of painfree movement to the lumbar spine.     Total Treatment time:                          Assessment:         Plan:     The risks/benefits of therapy have been discussed with the patient and he/she is in agreement with the established plan of care.    Therapist :     Lance Sellimothy S Kolsen Choe, PT

## 2016-05-11 ENCOUNTER — Ambulatory Visit (HOSPITAL_BASED_OUTPATIENT_CLINIC_OR_DEPARTMENT_OTHER)
Admission: RE | Admit: 2016-05-11 | Discharge: 2016-05-11 | Disposition: A | Discharge status: Still In Hospital | Payer: MEDICAID | Source: Ambulatory Visit | Attending: Family Medicine | Admitting: Family Medicine

## 2016-05-11 NOTE — PT Treatment (Signed)
[facility]  [place], [place] [id number]     Outpatient Rehabilitation Services  Physical Therapy Progress Note    Patient Name: Lindsay Mccullough  Date of Birth: 08/23/1985  MRN: [id number]  Payor: [employer] [place] [employer] / Plan: [place] [employer] / Product Type: Medicaid [place] /   Diagnosis:  Lumbar pain.  Recertification Date: 06/06/16.  Referring provider:  Michelle Piperabeuna.  Next Physician Follow-up Visit: ?    05/11/2016 is the patient's 2nd visit    Subjective   I am in a lot of pain.  No new reason.  Unable to sleep even.  Severe neck, back all areas.    Objective   Movements and transitional movements all appear normal.      Nustep for 3:30 seconds before she had to stop.  Table  LTR x 5 each way.  HP/ES in left sidelying.  Pads on the lower l/s centrally.  (able to remain in this position without c/o of increased discomfort).     Total Treatment time:  29 minutes.                        Assessment:   Chronic pain.  Continue to use modalities but encourage as much active movement as possible.    Plan:   See 1 more time this week.    The risks/benefits of therapy have been discussed with the patient and he/she is in agreement with the established plan of care.    Therapist :     Lance Sellimothy S Madix Blowe, PT

## 2016-05-12 ENCOUNTER — Ambulatory Visit (INDEPENDENT_AMBULATORY_CARE_PROVIDER_SITE_OTHER): Payer: Self-pay | Admitting: Family Medicine

## 2016-05-14 ENCOUNTER — Ambulatory Visit (HOSPITAL_BASED_OUTPATIENT_CLINIC_OR_DEPARTMENT_OTHER): Payer: MEDICAID

## 2016-05-18 ENCOUNTER — Ambulatory Visit (HOSPITAL_BASED_OUTPATIENT_CLINIC_OR_DEPARTMENT_OTHER)
Admission: RE | Admit: 2016-05-18 | Discharge: 2016-05-18 | Disposition: A | Discharge status: Still In Hospital | Payer: MEDICAID | Source: Ambulatory Visit | Attending: Family Medicine | Admitting: Family Medicine

## 2016-05-18 NOTE — PT Treatment (Signed)
[facility]  [place], [place] [id number]     Outpatient Rehabilitation Services  Physical Therapy Progress Note    Patient Name: Armstead PeaksJessica Mcconaha  Date of Birth: 05/15/1985  MRN: [id number]  Payor: [place] [employer] / Plan: [place] [employer] / Product Type: Medicaid MC /   Diagnosis:  Lumbar pain.  Recertification Date: 06/06/16.  Referring provider:  Michelle Piperabeuna.  Next Physician Follow-up Visit: ?    05/18/2016 is the patient's 3rd visit    Subjective   I am in a lot of pain.  No new reason.  Unable to sleep even.  Severe neck, back all areas.  Still the same level of severe pain.    Objective   Movements and transitional movements all appear normal.      Nustep for 3:30 seconds before she had to stop.  Table  LTR x 5 each way.  Added double leg bridging  HP/ES in left sidelying.  Pads on the lower l/s centrally.  (able to remain in this position without c/o of increased discomfort).     Total Treatment time:  30  minutes.                        Assessment:   Chronic pain.  Continue to use modalities but encourage as much active movement as possible.   Slightly more receptive to movement.    Plan:   See Thursday.      The risks/benefits of therapy have been discussed with the patient and he/she is in agreement with the established plan of care.    Therapist :     Lance Sellimothy S Lachell Rochette, PT

## 2016-05-21 ENCOUNTER — Ambulatory Visit (HOSPITAL_BASED_OUTPATIENT_CLINIC_OR_DEPARTMENT_OTHER): Payer: MEDICAID

## 2016-05-25 ENCOUNTER — Ambulatory Visit (HOSPITAL_BASED_OUTPATIENT_CLINIC_OR_DEPARTMENT_OTHER)
Admission: RE | Admit: 2016-05-25 | Discharge: 2016-05-25 | Disposition: A | Discharge status: Still In Hospital | Payer: MEDICAID | Source: Ambulatory Visit | Attending: Family Medicine | Admitting: Family Medicine

## 2016-05-25 NOTE — PT Treatment (Signed)
[facility]  [place], [place] [id number]     Outpatient Rehabilitation Services  Physical Therapy Progress Note    Patient Name: Lindsay Mccullough  Date of Birth: 12/11/1985  MRN: [id number]  Payor: [place] [employer] / Plan: [place] [employer] / Product Type: Medicaid MC /   Diagnosis:  Lumbar pain.  Recertification Date: 06/06/16.  Referring provider:  Michelle Piperabeuna.  Next Physician Follow-up Visit: ?    05/25/2016 is the patient's 4th visit    Subjective   Again severe pain has been continuing.  "it has been like this for the past 2 years.  No respite.  Was awake all night last night because of pain  Going to make dinner tonight at home - big meal.    Objective   Movements and transitional movements all appear normal.      Nustep for 4:15 seconds before she had to stop.  Table  LTR x 15 each way.  double leg bridging.  X 15    HP/ES in left sidelying.  Pads on the lower l/s centrally.  (able to remain in this position without c/o of increased discomfort).     Total Treatment time:  32  minutes.                        Assessment:   Chronic pain.  Continue to use modalities but encourage as much active movement as possible.   Slightly more receptive to movement.  Pain c/o do not match generalized movement abilities.    Plan:   See Thursday.      The risks/benefits of therapy have been discussed with the patient and he/she is in agreement with the established plan of care.    Therapist :     Lance Sellimothy S Tishia Maestre, PT

## 2016-05-28 ENCOUNTER — Ambulatory Visit (HOSPITAL_BASED_OUTPATIENT_CLINIC_OR_DEPARTMENT_OTHER)
Admission: RE | Admit: 2016-05-28 | Discharge: 2016-05-28 | Disposition: A | Discharge status: Still In Hospital | Payer: MEDICAID | Source: Ambulatory Visit | Attending: Family Medicine | Admitting: Family Medicine

## 2016-05-28 NOTE — PT Treatment (Signed)
[facility]  [place], [place] [id number]     Outpatient Rehabilitation Services  Physical Therapy Progress Note    Patient Name: Lindsay PeaksJessica Mccullough  Date of Birth: 12/16/1985  MRN: [id number]  Payor: [place] [employer] / Plan: [place] [employer] / Product Type: Medicaid MC /   Diagnosis:  Lumbar pain.  Recertification Date: 06/06/16.  Referring provider:  Michelle Piperabeuna.  Next Physician Follow-up Visit: ?    05/28/2016 is the patient's 5th visit    Subjective   Patient reported that she is in severe pain today and she hasn't been able to do anything all day. She stated that the electrical stimulation makes her back hurt more and she doesn't think that it is helping.     Objective   Movements and transitional movements all appear normal.      Manual Therapy: 35 minutes   STM to back to decrease muscle spasms     MHP to back to decrease muscle spasms       Total Treatment time:  35  minutes.                        Assessment:   Patient was advised to watch her posture, she is elevating her shoulders and anteriorly position them, will progress as tolerated. Gave her UT, scalenes, SCM stretches to do at home.     Short Term Goals-  -50% recall of HEP.  -Lessen low back pain by 2 levels.  -Begin to be able to start sleeping at night somewhat.  Long Term Goals  -100% recall of HEP.  -Lessen low back pain by 4 levels.  -Only have 1 x week when she is unable to get out of bed for the day.    Plan:   See in PT for 2 x week for 4 weeks.  Will try 97161, modalities, therapeutic exercise, and neuro muscular reeducation.     The risks/benefits of therapy have been discussed with the patient and he/she is in agreement with the established plan of care.    Therapist :     Samule DryKesha Daelon Dunivan, PTA  05/28/2016 17:00

## 2016-06-02 ENCOUNTER — Ambulatory Visit (HOSPITAL_BASED_OUTPATIENT_CLINIC_OR_DEPARTMENT_OTHER)
Admission: RE | Admit: 2016-06-02 | Discharge: 2016-06-02 | Disposition: A | Discharge status: Still In Hospital | Payer: MEDICAID | Source: Ambulatory Visit | Attending: Family Medicine | Admitting: Family Medicine

## 2016-06-02 NOTE — PT Treatment (Signed)
[facility]  [place], [place] [address]     Outpatient Rehabilitation Services  Physical Therapy Progress Note    Patient Name: Chabely Norby  Date of Birth: [birth year]  MRN: [id number]  Payor: [place] [employer] / Plan: [place] [employer] / Product Type: Medicaid MC /   Diagnosis:  Lumbar pain.  Recertification Date: 06/06/16.  Referring provider:  Michelle Piper.  Next Physician Follow-up Visit: ?    06/02/2016 is the patient's 6th visit    Subjective   Patient reported that she was doing good for two days and then she is now hurting.     Objective   Movements and transitional movements all appear normal.      Manual Therapy: 35 minutes   STM to back to decrease muscle spasms     Electrical Stimulation 15 minutes       Total Treatment time:  50  minutes.                        Assessment:   Patient is needing VC's to watch posture, her neck is feeling better.     Short Term Goals-  -50% recall of HEP.  -Lessen low back pain by 2 levels.  -Begin to be able to start sleeping at night somewhat.  Long Term Goals  -100% recall of HEP.  -Lessen low back pain by 4 levels.  -Only have 1 x week when she is unable to get out of bed for the day.    Plan:   See in PT for 2 x week for 4 weeks.  Will try 97161, modalities, therapeutic exercise, and neuro muscular reeducation.     The risks/benefits of therapy have been discussed with the patient and he/she is in agreement with the established plan of care.    Therapist :     Samule Dry, PTA  06/02/2016 14:51

## 2016-06-04 ENCOUNTER — Ambulatory Visit (HOSPITAL_BASED_OUTPATIENT_CLINIC_OR_DEPARTMENT_OTHER)
Admission: RE | Admit: 2016-06-04 | Discharge: 2016-06-04 | Disposition: A | Discharge status: Still In Hospital | Payer: MEDICAID | Source: Ambulatory Visit | Attending: Family Medicine | Admitting: Family Medicine

## 2016-06-04 NOTE — PT Treatment (Signed)
[facility]  [place], [place] [address]     Outpatient Rehabilitation Services  Physical Therapy Progress Note    Patient Name: Lindsay Mccullough  Date of Birth: 1985-12-14  MRN: [id number]  Payor: [place] [employer] / Plan: [place] [employer] / Product Type: Medicaid MC /   Diagnosis:  Lumbar pain.  Recertification Date: 06/06/16.  Referring provider:  Michelle Piper.  Next Physician Follow-up Visit: ?    06/04/2016 is the patient's 6th visit    Subjective   Patient reports that she is hurting today because she cleaned the entire house yesterday.     Objective   Movements and transitional movements all appear normal.      Manual Therapy: 35 minutes   STM to back to decrease muscle spasms     Electrical Stimulation 15 minutes       Total Treatment time:  50  minutes.                        Assessment:   Her muscles weren't having spasms today, she is starting to respond well.     Short Term Goals-  -50% recall of HEP.  -Lessen low back pain by 2 levels.  -Begin to be able to start sleeping at night somewhat.  Long Term Goals  -100% recall of HEP.  -Lessen low back pain by 4 levels.  -Only have 1 x week when she is unable to get out of bed for the day.    Plan:   See in PT for 2 x week for 4 weeks.  Will try 97161, modalities, therapeutic exercise, and neuro muscular reeducation.     The risks/benefits of therapy have been discussed with the patient and he/she is in agreement with the established plan of care.    Therapist :     Samule Dry, PTA  06/04/2016 10:28

## 2016-06-09 ENCOUNTER — Ambulatory Visit (HOSPITAL_BASED_OUTPATIENT_CLINIC_OR_DEPARTMENT_OTHER)
Admission: RE | Admit: 2016-06-09 | Discharge: 2016-06-09 | Disposition: A | Discharge status: Still In Hospital | Payer: MEDICAID | Source: Ambulatory Visit | Attending: Family Medicine | Admitting: Family Medicine

## 2016-06-09 DIAGNOSIS — M545 Low back pain: Secondary | ICD-10-CM | POA: Insufficient documentation

## 2016-06-09 NOTE — PT Treatment (Signed)
[facility]  [place], [place] [address]     Outpatient Rehabilitation Services  Physical Therapy Progress Note    Patient Name: Lindsay Mccullough  Date of Birth: November 21, 1985  MRN: [id number]  Payor: [place] [employer] / Plan: [place] [employer] / Product Type: Medicaid MC /   Diagnosis:  Lumbar pain.  Recertification Date: 06/06/16.  Referring provider:  Michelle Piper.  Next Physician Follow-up Visit: ?    06/09/2016 is the patient's 7th visit    Subjective   Patient reports that she is getting better and has been working on her posture.     Objective   TE: 5 minutes  Nustep x 5    Manual Therapy: 35 minutes   STM to back to decrease muscle spasms     Electrical Stimulation 15 minutes       Total Treatment time:  55  minutes.                        Assessment:   We will be adding some exercises into next session to strengthen her scapula, will continue to progress as tolerated.     Short Term Goals-  -50% recall of HEP.  -Lessen low back pain by 2 levels.  -Begin to be able to start sleeping at night somewhat.  Long Term Goals  -100% recall of HEP.  -Lessen low back pain by 4 levels.  -Only have 1 x week when she is unable to get out of bed for the day.    Plan:   See in PT for 2 x week for 4 weeks.  Will try 97161, modalities, therapeutic exercise, and neuro muscular reeducation.     The risks/benefits of therapy have been discussed with the patient and he/she is in agreement with the established plan of care.    Therapist :     Samule Dry, PTA  06/09/2016 15:25

## 2016-06-11 ENCOUNTER — Ambulatory Visit (HOSPITAL_BASED_OUTPATIENT_CLINIC_OR_DEPARTMENT_OTHER): Payer: Self-pay

## 2016-06-18 ENCOUNTER — Telehealth (INDEPENDENT_AMBULATORY_CARE_PROVIDER_SITE_OTHER): Payer: Self-pay | Admitting: Family Medicine

## 2016-06-18 ENCOUNTER — Ambulatory Visit (HOSPITAL_BASED_OUTPATIENT_CLINIC_OR_DEPARTMENT_OTHER): Payer: Self-pay

## 2016-06-18 DIAGNOSIS — M545 Low back pain, unspecified: Secondary | ICD-10-CM

## 2016-06-18 DIAGNOSIS — M546 Pain in thoracic spine: Secondary | ICD-10-CM

## 2016-06-18 NOTE — Telephone Encounter (Signed)
PATIENT CALLED AND WANTED TO KNOW SINCE HER MRI WAS DENIED IF YOU COULD ORDER HER A CT SCAN?

## 2016-06-25 ENCOUNTER — Ambulatory Visit (HOSPITAL_BASED_OUTPATIENT_CLINIC_OR_DEPARTMENT_OTHER): Payer: Self-pay

## 2016-07-02 ENCOUNTER — Ambulatory Visit (HOSPITAL_BASED_OUTPATIENT_CLINIC_OR_DEPARTMENT_OTHER): Payer: Self-pay

## 2016-07-07 ENCOUNTER — Ambulatory Visit (HOSPITAL_BASED_OUTPATIENT_CLINIC_OR_DEPARTMENT_OTHER)
Admission: RE | Admit: 2016-07-07 | Discharge: 2016-07-07 | Disposition: A | Discharge status: Still In Hospital | Payer: MEDICAID | Source: Ambulatory Visit | Attending: Family Medicine | Admitting: Family Medicine

## 2016-07-07 DIAGNOSIS — M545 Low back pain: Secondary | ICD-10-CM | POA: Insufficient documentation

## 2016-07-07 NOTE — PT Treatment (Signed)
[facility]  [place], [place] [id number]     Outpatient Rehabilitation Services  Physical Therapy Progress Note    Patient Name: Lindsay Mccullough  Date of Birth: 02/07/1986  MRN: [id number]  Payor: [place] [employer] / Plan: [place] [employer] / Product Type: Medicaid MC /   Diagnosis:  Lumbar pain.  Recertification Date: 06/06/16.  Referring provider:  Michelle Piperabeuna.  Next Physician Follow-up Visit: ?    07/07/2016 is the patient's 8th visit    Subjective   Patient reports that she is really sore due to helping mow 2 acres of grass yesterday. She reported she ran out of her anti-depressant meds and it is not helping with her tension.      Objective   TE: 5 minutes HELD   Nustep x 5    Manual Therapy: 25 minutes   STM to back to decrease muscle spasms     Electrical Stimulation 15 minutes       Total Treatment time: 40  minutes.                        Assessment:   Patient had some tension in her shoulder and scapula area, but had relief after session.     Short Term Goals-  -50% recall of HEP.  -Lessen low back pain by 2 levels.  -Begin to be able to start sleeping at night somewhat.  Long Term Goals  -100% recall of HEP.  -Lessen low back pain by 4 levels.  -Only have 1 x week when she is unable to get out of bed for the day.    Plan:   See in PT for 2 x week for 4 weeks.  Will try 97161, modalities, therapeutic exercise, and neuro muscular reeducation.     The risks/benefits of therapy have been discussed with the patient and he/she is in agreement with the established plan of care.    Therapist :     Samule DryKesha Tamara Monteith, PTA  07/07/2016 15:52

## 2016-07-09 ENCOUNTER — Ambulatory Visit (INDEPENDENT_AMBULATORY_CARE_PROVIDER_SITE_OTHER): Payer: MEDICAID | Admitting: Family

## 2016-07-09 ENCOUNTER — Encounter (INDEPENDENT_AMBULATORY_CARE_PROVIDER_SITE_OTHER): Payer: Self-pay | Admitting: Family

## 2016-07-09 VITALS — BP 102/60 | HR 80 | Temp 98.4°F | Resp 18 | Ht 63.0 in | Wt 132.8 lb

## 2016-07-09 DIAGNOSIS — L739 Follicular disorder, unspecified: Secondary | ICD-10-CM

## 2016-07-09 MED ORDER — MUPIROCIN 2 % TOPICAL OINTMENT
TOPICAL_OINTMENT | CUTANEOUS | 0 refills | Status: DC
Start: 2016-07-09 — End: 2016-10-29

## 2016-07-09 MED ORDER — SULFAMETHOXAZOLE 800 MG-TRIMETHOPRIM 160 MG TABLET
1.00 | ORAL_TABLET | Freq: Two times a day (BID) | ORAL | 0 refills | Status: AC
Start: 2016-07-09 — End: 2016-07-14

## 2016-07-09 NOTE — Patient Instructions (Signed)
[facility]  [address]  [place], [place] [address]  Phone: [phone]  Fax: [phone]               Open Mon - Sat 8:00am - 8:00pm [place] Crocker- 8:00pm                                                              ~ Closed Thanksgiving and Christmas Day     Attending Caregiver: Fleeta Emmer, APRN      Today's orders:   Orders Placed This Encounter   . mupirocin (BACTROBAN) 2 % Ointment   . trimethoprim-sulfamethoxazole (BACTRIM DS) 160-800mg  per tablet        Prescription(s) E-Rx to:  [facility] - [place], [place] - [address]    ________________________________________________________________________  Short Term Disability and Family Medical Leave Act   Urgent Care does NOT provide assistance with any disability applications.  If you feel your medical condition requires you to be on disability, you will need to follow up with  Your primary care physician or a specialist.  We apologize for any inconvenience.    For Medication Prescribed by [facility]:  As an Urgent Care facility, our clinic does NOT offer prescription refills over the telephone.    If you need more of the medication one of our medical providers prescribed, you will  Either need to be re-evaluated by [place] or see your primary care physician.    ________________________________________________________________________      It is very important that we have a phone number that is the single best way to contact you in the event that we become aware of important clinical information or concerns after your discharge.  If the phone number you provided at registration is NOT this number you should inform staff and registration prior to leaving.      Your treatment and evaluation today was focused on identifying and treating potentially emergent conditions based on your presenting signs, symptoms, and history.  The resulting initial clinical impression and treatment plan is not intended to be definitive or a  substitute for a full physical examination and evaluation by your primary care provider.  If your symptoms persist, worsen, or you develop any new or concerning symptoms, you need to be evaluated.      If you received x-rays during your visit, be aware that the final and formal interpretation of those films by a radiologist may occur after your discharge.  If there is a significant discrepancy identified after your discharge, we will contact you at the telephone number provided at registration.      If you received a pelvic exam, you may have cultures pending for sexually transmitted diseases.  Positive cultures are reported to the [facility] Department of Health as required by state law.  You should be contacted if you cultures are positive.  We will not contact you if they are negative.  You did NOT receive a PAP smear (the screening test for cervical).  This specific test for women is best performed by your gynecologist or primary care provider when indicated.      If you are over 62 year old, we cannot discuss your personal health information with a parent, spouse, family member, or anyone else without  your express consent.  This does not include those who have legitimate access to your records and information to assist in your care under the provisions of HIPAA [facility](Health Insurance Portability and Accountability Act) law, or those to whom you have previously given express written consent to do so, such a legal guardian or Power of [place] BrooksAttorney.      You may have received medication that may cause you to feel drowsy and/or light headed for several hours.  You may even experience some amnesia of your stay.  You should avoid operating a motor vehicle or performing any activity requiring complete alertness or coordination until you feel fully awake (approximately 24-48 hours).  Avoid alcoholic beverages.  You may also have a dry mouth for several hours.  This is a normal side effect and will disappear as the effects of the  medication wear off.      Instructions discussed with patient upon discharge by clinical staff with all questions answered.  Please call [facility] [phone]) if any further questions.  Go immediately to the emergency department if any concern or worsening symptoms.      Fleeta EmmerSandra [facility], APRN 07/09/2016, 15:09

## 2016-07-09 NOTE — Progress Notes (Signed)
[facility]  [address]  [place] [address]  Dept: [phone]  Dept Fax: [phone]  Loc: [phone]  Loc Fax: [phone]     Patient Name: Lindsay Mccullough  Date of Service: 07/09/16  Date of Birth: Jan 23, 1986    Chief Complaint:   Chief Complaint     Lumps               HPI: Lindsay Mccullough is a 31 y.o. female who presents today with Lumps.   She woke up yesterday and noted 5  painful, red lumps in  her right  Axilla.  Today she noted only 1 lump.  She reports shaving her axilla 2 days ago.  She changes her razor every other shave.  No prior hx of similar lumps. Denies fever, chills, sweats, cough, chest tightness, chest pain.   She is worried she has cancer.     History:  Vital signs and history as obtained by clinical staff.  Past Medical History:   Diagnosis Date   . Asthma    . Dysplasia of cervix    . H/O seasonal allergies          Past Surgical History:   Procedure Laterality Date   . HX DILATION AND CURETTAGE     . HX NO SURGICAL PROCEDURES           Family Medical History     Problem Relation (Age of Onset)    Cirrhosis Father    Colon Cancer Other    Congestive Heart Failure Mother    Diabetes Paternal Grandmother    Hearing Loss Mother    Heart Attack Mother    Heart Disease Mother    High Cholesterol Mother, Father    Hypertension Mother, Father, Brother            Social History     Social History   . Marital status: Divorced     Spouse name: N/A   . Number of children: N/A   . Years of education: N/A     Social History Main Topics   . Smoking status: Former Smoker     Packs/day: 0.25     Start date: 11/20/1998     Quit date: 10/13/2015   . Smokeless tobacco: Never Used   . Alcohol use No   . Drug use: Not on file   . Sexual activity: Not on file     Other Topics Concern   . Breast Self Exam No     Social History Narrative     Expanded Substance History     Additional history       Allergies:  Allergies   Allergen Reactions   . Amoxicillin  Anaphylaxis   . Spironolactone Rash and Hives/ Urticaria   . Iodine      Blisters on left arm   . Penicillins Hives/ Urticaria     Problem List:  Patient Active Problem List    Diagnosis   . D-dimer, elevated   . Abnormal blood test   . Chest pain   . Elevated d-dimer   . Cyst of left ovary   . Hyperandrogenemia   . Elevated dehydroepiandrosterone (DHEA) level (HCC)   . Asthma   . Hirsutism     Medication:  Outpatient Encounter Prescriptions as of 07/09/2016   Medication Sig Dispense Refill   . ASPIRIN/ACETAMINOPHEN/CAFFEINE (EXCEDRIN MIGRAINE ORAL) Take by mouth     . DM/PSEUDOEPHED/ACETAMINOPH/CPM (TYLENOL COLD ORAL)  Take by mouth     . fluticasone (FLONASE) 50 mcg/actuation Nasal Spray, Suspension 1 Spray by Each Nostril route Once a day     . gabapentin (NEURONTIN) 100 mg Oral Capsule Take 100 mg by mouth Three times a day     . MULTIVITS,CA,MINERALS/IRON/FA (ONE-A-DAY WOMENS FORMULA ORAL) Take by mouth     . mupirocin (BACTROBAN) 2 % Ointment Apply a thin layer to effected area for 10 days 1 Tube 0   . sertraline (ZOLOFT) 50 mg Oral Tablet Take 100 mg by mouth Once a day      . trimethoprim-sulfamethoxazole (BACTRIM DS) 160-800mg  per tablet Take 1 Tab (160 mg total) by mouth Every 12 hours for 5 days 10 Tab 0   . ZOLPIDEM TARTRATE (AMBIEN ORAL) Take by mouth       No facility-administered encounter medications on file as of 07/09/2016.        Review of Systems:  Pertinent items are noted in HPI. All pertinent positives and negatives noted in the HPIAs plus below:   Constitutional: no fevers, chills, HA  CV: no chest pain, or palpitations  Resp: No cough, wheezing, or shortness of breath  GI: No Ab pain, nausea, vomiting, or diarrhea  Neuro: No deficits noted    Exam:  General Vitals: BP 102/60  Pulse 80  Temp 36.9 C (98.4 F) (Oral)   Resp 18  Ht 1.6 m (5\' 3" )  Wt 60.2 kg (132 lb 12.8 oz)  LMP 07/08/2016  SpO2 98%  BMI 23.52 kg/m2  General:  Healthy female, no acute distress  Eyes: Conjunctivae clear.   Head - normocephalic, atraumatic.  Neck- supple,  No adenopathy     Lungs: clear to auscultation bilaterally. No rales no rhonchi no wheezing  Cardiovascular: Heart regular rate and rhythm,  No murmur    Skin: Rt.   Axilla with 1 cm tender nodule,  No fluctuance,  No erythema  Neurologic: alert and oriented x3.       Assessment and Plan:    ICD-10-CM    1. Folliculitis L73.9      Medication Orders   Medications   . mupirocin (BACTROBAN) 2 % Ointment     Sig: Apply a thin layer to effected area for 10 days     Dispense:  1 Tube     Refill:  0   . trimethoprim-sulfamethoxazole (BACTRIM DS) 160-800mg  per tablet     Sig: Take 1 Tab (160 mg total) by mouth Every 12 hours for 5 days     Dispense:  10 Tab     Refill:  0      Rx for Bactrim Antibiotics as above with warnings including allergic reaction, which can be severe. Patient advised these type of reactions are unpredictable. Yoghurt/probiotics prophylaxis discussed.   Use Bactroban twice daily for 10 days.  Clean area with antibacterial soap daily.    Follow up with PCP if symptoms worsen including redness, fever, swelling.   Patient verbalized understanding and agreement of plan of care all questions answered.    Return if symptoms worsen or fail to improve.    Fleeta EmmerSandra Patty Lopezgarcia, Dyann RuddleANP-C, CPNP-PC   Supervising Physician Dr.  Lissa HoardLouis      Portions of this note may be dictated using voice recognition software or a dictation service. Variances in spelling and vocabulary are possible and unintentional. Not all errors are caught/corrected. Please notify the [facility] if any discrepancies are noted or if the meaning of any statement is not clear.

## 2016-07-09 NOTE — Nursing Note (Signed)
BP 102/60  Pulse 80  Temp 36.9 C (98.4 F) (Oral)   Resp 18  Ht 1.6 m (5\' 3" )  Wt 60.2 kg (132 lb 12.8 oz)  LMP 07/08/2016  SpO2 98%  BMI 23.52 kg/m2  Lindsay Mallingngela Jaking Thayer, LPN  4/74/25955/11/2016, 14:40

## 2016-07-10 ENCOUNTER — Telehealth (INDEPENDENT_AMBULATORY_CARE_PROVIDER_SITE_OTHER): Payer: Self-pay | Admitting: Family

## 2016-07-10 NOTE — Telephone Encounter (Signed)
Patient was seen at Urgent Care yesterday. Attempted to call for follow up, no answer at this time. Message was left on voicemail.       Harriette OharaHeather Linnea Todisco, CMA  07/10/2016, 13:51

## 2016-07-14 ENCOUNTER — Ambulatory Visit (HOSPITAL_BASED_OUTPATIENT_CLINIC_OR_DEPARTMENT_OTHER)
Admission: RE | Admit: 2016-07-14 | Discharge: 2016-07-14 | Disposition: A | Discharge status: Still In Hospital | Payer: MEDICAID | Source: Ambulatory Visit | Attending: Family Medicine | Admitting: Family Medicine

## 2016-07-14 NOTE — PT Treatment (Signed)
[facility]  [place], [place] [id number]     Outpatient Rehabilitation Services  Physical Therapy Progress Note    Patient Name: Armstead PeaksJessica Baldus  Date of Birth: 05/16/1985  MRN: [id number]  Payor: [place] [employer] / Plan: [place] [employer] / Product Type: Medicaid MC /   Diagnosis:  Lumbar pain.  Recertification Date: 06/06/16.  Referring provider:  Michelle Piperabeuna.  Next Physician Follow-up Visit: ?    07/14/2016 is the patient's 9th visit    Subjective   Patient reported that she didn't sleep last night, but she was able to get her Zoloft.     Objective   TE: 5 minutes HELD   Nustep x 5    Manual Therapy: 25 minutes   STM to back to decrease muscle spasms     Electrical Stimulation 15 minutes       Total Treatment time: 40  minutes.                        Assessment:   Patient is having less symptoms, she will be coming one time next week.     Short Term Goals-  -50% recall of HEP.  -Lessen low back pain by 2 levels.  -Begin to be able to start sleeping at night somewhat.  Long Term Goals  -100% recall of HEP.  -Lessen low back pain by 4 levels.  -Only have 1 x week when she is unable to get out of bed for the day.    Plan:   See in PT for 2 x week for 4 weeks.  Will try 97161, modalities, therapeutic exercise, and neuro muscular reeducation.     The risks/benefits of therapy have been discussed with the patient and he/she is in agreement with the established plan of care.    Therapist :     Samule DryKesha Chante Mayson, PTA  07/14/2016 16:09

## 2016-07-21 ENCOUNTER — Ambulatory Visit (HOSPITAL_BASED_OUTPATIENT_CLINIC_OR_DEPARTMENT_OTHER)
Admission: RE | Admit: 2016-07-21 | Discharge: 2016-07-21 | Disposition: A | Discharge status: Still In Hospital | Payer: MEDICAID | Source: Ambulatory Visit | Attending: Family Medicine | Admitting: Family Medicine

## 2016-07-21 NOTE — PT Treatment (Signed)
[facility]  [place], [place] [id number]     Outpatient Rehabilitation Services  Physical Therapy Progress Note    Patient Name: Lindsay Mccullough  Date of Birth: 04/24/1985  MRN: [id number]  Payor: [employer] / Plan: [employer] / Product Type: Medicaid MC /   Diagnosis:  Lumbar pain.  Recertification Date: 06/06/16.  Referring provider:  Michelle Piperabeuna.  Next Physician Follow-up Visit: ?    07/21/2016 is the patient's 10th visit    Subjective   Patient reported that she has been hurting all weekend due to her mowing the grass over the weekend. She stated that she is starting to have anxiety attacks and feels that she can't handle the stress.     Objective   TE: 5 minutes HELD   Nustep x 5    Manual Therapy: 25 minutes   STM to back to decrease muscle spasms     Electrical Stimulation 15 minutes       Total Treatment time: 40  minutes.                        Assessment:   Patient is having muscle spasms due to her medication imbalance, she is being d/c at next visit.     Short Term Goals-  -50% recall of HEP.  -Lessen low back pain by 2 levels.  -Begin to be able to start sleeping at night somewhat.  Long Term Goals  -100% recall of HEP.  -Lessen low back pain by 4 levels.  -Only have 1 x week when she is unable to get out of bed for the day.    Plan:   See in PT for 2 x week for 4 weeks.  Will try 97161, modalities, therapeutic exercise, and neuro muscular reeducation.     The risks/benefits of therapy have been discussed with the patient and he/she is in agreement with the established plan of care.    Therapist :     Samule DryKesha Kindrick Lankford, PTA  07/21/2016 13:31

## 2016-07-22 ENCOUNTER — Encounter (INDEPENDENT_AMBULATORY_CARE_PROVIDER_SITE_OTHER): Payer: Self-pay

## 2016-07-23 ENCOUNTER — Ambulatory Visit (HOSPITAL_BASED_OUTPATIENT_CLINIC_OR_DEPARTMENT_OTHER)
Admission: RE | Admit: 2016-07-23 | Discharge: 2016-07-23 | Disposition: A | Discharge status: Still In Hospital | Payer: MEDICAID | Source: Ambulatory Visit | Attending: Family Medicine | Admitting: Family Medicine
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
## 2016-07-30 ENCOUNTER — Encounter (INDEPENDENT_AMBULATORY_CARE_PROVIDER_SITE_OTHER): Payer: Self-pay | Admitting: Family Medicine

## 2016-07-30 ENCOUNTER — Ambulatory Visit (INDEPENDENT_AMBULATORY_CARE_PROVIDER_SITE_OTHER): Payer: Medicaid (Managed Care) | Admitting: Family Medicine

## 2016-07-30 VITALS — BP 118/64 | HR 101 | Temp 98.1°F | Resp 18 | Ht 63.0 in | Wt 137.2 lb

## 2016-07-30 DIAGNOSIS — R05 Cough: Secondary | ICD-10-CM

## 2016-07-30 DIAGNOSIS — F17200 Nicotine dependence, unspecified, uncomplicated: Secondary | ICD-10-CM

## 2016-07-30 DIAGNOSIS — R059 Cough, unspecified: Secondary | ICD-10-CM

## 2016-07-30 DIAGNOSIS — F17210 Nicotine dependence, cigarettes, uncomplicated: Secondary | ICD-10-CM

## 2016-07-30 DIAGNOSIS — G8929 Other chronic pain: Secondary | ICD-10-CM

## 2016-07-30 DIAGNOSIS — M545 Low back pain: Secondary | ICD-10-CM

## 2016-07-30 DIAGNOSIS — M549 Dorsalgia, unspecified: Secondary | ICD-10-CM

## 2016-07-30 MED ORDER — ALBUTEROL SULFATE HFA 108 (90 BASE) MCG/ACT IN AERS
2.0000 | INHALATION_SPRAY | Freq: Four times a day (QID) | RESPIRATORY_TRACT | 0 refills | Status: AC | PRN
Start: 2016-07-30 — End: ?
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
## 2016-08-02 ENCOUNTER — Ambulatory Visit (INDEPENDENT_AMBULATORY_CARE_PROVIDER_SITE_OTHER): Payer: MEDICAID | Admitting: Physician Assistant

## 2016-08-02 ENCOUNTER — Other Ambulatory Visit (HOSPITAL_BASED_OUTPATIENT_CLINIC_OR_DEPARTMENT_OTHER): Payer: MEDICAID | Attending: Physician Assistant

## 2016-08-02 ENCOUNTER — Encounter (INDEPENDENT_AMBULATORY_CARE_PROVIDER_SITE_OTHER): Payer: Self-pay | Admitting: Physician Assistant

## 2016-08-02 VITALS — BP 112/81 | HR 88 | Temp 98.3°F | Ht 63.0 in | Wt 136.0 lb

## 2016-08-02 DIAGNOSIS — J029 Acute pharyngitis, unspecified: Secondary | ICD-10-CM

## 2016-08-02 NOTE — Nursing Note (Signed)
08/02/16 1900   Rapid Strep   Rapid Strep Negative   Init[facility]   Lot# [id number]   Expiration Date 04/30/18   Internal Control Valid yes

## 2016-08-02 NOTE — Nursing Note (Signed)
BP 112/81  Pulse 88  Temp 36.8 C (98.3 F) (Oral)   Ht 1.6 m (5\' 3" )  Wt 61.7 kg (136 lb)  LMP 07/08/2016  SpO2 100%  BMI 24.09 kg/m2  Carlus Stay, CMA  08/02/2016, 19:46

## 2016-08-02 NOTE — Patient Instructions (Signed)
[facility]  [address]  [place], [place] [id number]  Phone: [phone]  Fax: [phone]               Open Mon - Sat 8:00am - 8:00pm L[place] CrockerSun Noon- 8:00pm                                                              ~ Closed Thanksgiving and Christmas Day     Attending Caregiver: Judeen HammansKalika Miller Pitcher, PA-C      Today's orders:   Orders Placed This Encounter   . POCT RAPID STREP A        Prescription(s) E-Rx to:  [facility] - [place], [place] - [address]    ________________________________________________________________________  Short Term Disability and Family Medical Leave Act  [facility] does NOT provide assistance with any disability applications.  If you feel your medical condition requires you to be on disability, you will need to follow up with  Your primary care physician or a specialist.  We apologize for any inconvenience.    For Medication Prescribed by [facility]:  As an Urgent Care facility, our clinic does NOT offer prescription refills over the telephone.    If you need more of the medication one of our medical providers prescribed, you will  Either need to be re-evaluated by us or see your primary care physician.    ________________________________________________________________________      It is very important that we have a phone number that is the single best way to contact you in the event that we become aware of important clinical information or concerns after your discharge.  If the phone number you provided at registration is NOT this number you should inform staff and registration prior to leaving.      Your treatment and evaluation today was focused on identifying and treating potentially emergent conditions based on your presenting signs, symptoms, and history.  The resulting initial clinical impression and treatment plan is not intended to be definitive or a substitute for a full physical examination and evaluation by your primary care  provider.  If your symptoms persist, worsen, or you develop any new or concerning symptoms, you need to be evaluated.      If you received x-rays during your visit, be aware that the final and formal interpretation of those films by a radiologist may occur after your discharge.  If there is a significant discrepancy identified after your discharge, we will contact you at the telephone number provided at registration.      If you received a pelvic exam, you may have cultures pending for sexually transmitted diseases.  Positive cultures are reported to the [facility] Department of Health as required by state law.  You should be contacted if you cultures are positive.  We will not contact you if they are negative.  You did NOT receive a PAP smear (the screening test for cervical).  This specific test for women is best performed by your gynecologist or primary care provider when indicated.      If you are over 10558 year old, we cannot discuss your personal health information with a parent, spouse, family member, or anyone else without your express consent.  This does not include those  who have legitimate access to your records and information to assist in your care under the provisions of HIPAA [facility] Portability and Accountability Act) law, or those to whom you have previously given express written consent to do so, such a legal guardian or Power of Jac[place].      You may have received medication that may cause you to feel drowsy and/or light headed for several hours.  You may even experience some amnesia of your stay.  You should avoid operating a motor vehicle or performing any activity requiring complete alertness or coordination until you feel fully awake (approximately 24-48 hours).  Avoid alcoholic beverages.  You may also have a dry mouth for several hours.  This is a normal side effect and will disappear as the effects of the medication wear off.      Instructions discussed with patient upon discharge by  clinical staff with all questions answered.  Please call [facility] [phone]) if any further questions.  Go immediately to the emergency department if any concern or worsening symptoms.      Judeen Hammans, PA-C 08/02/2016, 19:58

## 2016-08-02 NOTE — Progress Notes (Signed)
SUBJECTIVE:   Meda Dudzinski is a 31 y.o. female who complains of sore throat, scratchy throat, dry cough for 2 days. She denies a history of fever, wheezing, shortness of breath, chest pain, dizziness, nausea and vomiting and denies a history of asthma.  Pt has taken nothing for symptoms. No known sick contacts. Symptoms are worse in the mornings, better throughout the day.    ROS: no fever, no headaches, no wheezing or shortness of breath, no weakness or difficulty with ambulation, no skin rash or lesion      Social History   Substance Use Topics   . Smoking status: Former Smoker     Packs/day: 0.25     Start date: 11/20/1998     Quit date: 10/13/2015   . Smokeless tobacco: Never Used   . Alcohol use No     Allergy History as of 08/02/16     PENICILLINS       Noted Status Severity Type Reaction    10/28/13 1526 Maximiano Coss, [place] 10/28/13 Active Low  Hives/ Urticaria          AMOXICILLIN       Noted Status Severity Type Reaction    10/28/13 1526 Maximiano Coss, [place] 10/28/13 Active High  Anaphylaxis          IODINE       Noted Status Severity Type Reaction    10/05/14 0841 Daleen Bo 10/05/14 Active       Comments:  Blisters on left arm           SPIRONOLACTONE       Noted Status Severity Type Reaction    09/16/15 1057 Elvera Maria 09/16/15 Active Medium  Rash, Hives/ Urticaria              Family Medical History     Problem Relation (Age of Onset)    Cirrhosis Father    Colon Cancer Other    Congestive Heart Failure Mother    Diabetes Paternal Grandmother    Hearing Loss Mother    Heart Attack Mother    Heart Disease Mother    High Cholesterol Mother, Father    Hypertension Mother, Father, Brother            Past Medical History:   Diagnosis Date   . Asthma    . Dysplasia of cervix    . H/O seasonal allergies          Patient Active Problem List   Diagnosis   . Asthma   . Hirsutism   . Hyperandrogenemia   . Elevated dehydroepiandrosterone (DHEA) level (HCC)   . Cyst of left ovary   . Chest pain    . Elevated d-dimer   . Abnormal blood test   . D-dimer, elevated       Outpatient Medications Prior to Visit:  ASPIRIN/ACETAMINOPHEN/CAFFEINE (EXCEDRIN MIGRAINE ORAL) Take by mouth   DM/PSEUDOEPHED/ACETAMINOPH/CPM (TYLENOL COLD ORAL) Take by mouth   fluticasone (FLONASE) 50 mcg/actuation Nasal Spray, Suspension 1 Spray by Each Nostril route Once a day   gabapentin (NEURONTIN) 100 mg Oral Capsule Take 100 mg by mouth Three times a day   MULTIVITS,CA,MINERALS/IRON/FA (ONE-A-DAY WOMENS FORMULA ORAL) Take by mouth   mupirocin (BACTROBAN) 2 % Ointment Apply a thin layer to effected area for 10 days   sertraline (ZOLOFT) 50 mg Oral Tablet Take 100 mg by mouth Once a day    ZOLPIDEM TARTRATE (AMBIEN ORAL) Take by mouth     No facility-administered medications prior  to visit.      OBJECTIVE:  BP 112/81  Pulse 88  Temp 36.8 C (98.3 F) (Oral)   Ht 1.6 m (5\' 3" )  Wt 61.7 kg (136 lb)  LMP 07/08/2016  SpO2 100%  BMI 24.09 kg/m2  She appears well, vital signs are as noted.   Ears: normal TM's and external ear canals AU.    Throat and pharynx +posterior OP erythema and PND, no tonsillar hypertrophy or exudates.   Neck supple. No adenopathy in the neck. Nose clear mucus present in both nares. Maxillary sinuses nontender   The chest is clear, without wheezes or rales   Heart: regular rate and rhythm  Skin: Warm and dry, no rash or lesion    Rapid strep: negative. Sending molecular.    ASSESSMENT:     ICD-10-CM    1. Sore throat J02.9 POCT RAPID STREP A     STREP A MOLECULAR RAPID - [facility] ONLY       PLAN:  Sending molecular for verification. Conservative measures discussed. Antihistamines, flonase per epic orders, trial for 7-10 days.Discussed good handwashing, may use tylenol for fever or pain Q4-6hrs.  Symptomatic therapy suggested: push fluids, rest, use acetaminophen, ibuprofen prn and ROV prn if symptoms persist or worsen. Pt verbalized understanding and agrees with the plan of care. Advised pt to call or return to  clinic prn if these symptoms worsen or fail to improve as anticipated.    Wyn ForsterKalika Miller Pine HarborPitcher, PA-C 08/02/2016 20:02    My supervising physician today is Dr. Wyn Quakeroelkey.

## 2016-08-03 ENCOUNTER — Other Ambulatory Visit (INDEPENDENT_AMBULATORY_CARE_PROVIDER_SITE_OTHER): Payer: Self-pay

## 2016-08-03 ENCOUNTER — Encounter (INDEPENDENT_AMBULATORY_CARE_PROVIDER_SITE_OTHER): Payer: Self-pay | Admitting: Physician Assistant

## 2016-08-03 LAB — STREP A MOLECULAR RAPID - BMC/JMC ONLY: STREP A MOLECULAR RAPID: NEGATIVE

## 2016-08-03 NOTE — Nursing Note (Signed)
Spoke with patient, no questions or concerns at this time.   [place] Brentwood[place]ittany Delton Mccullough, [place]  08/03/2016, 09:21

## 2016-08-03 NOTE — Progress Notes (Signed)
Molecular strep was negative:    Continue with current plan of care.  Follow up with PCP if symptoms do not improve.

## 2016-08-04 MED ORDER — CYCLOBENZAPRINE HCL 10 MG PO TABS
10.0000 mg | ORAL_TABLET | Freq: Three times a day (TID) | ORAL | 1 refills | Status: DC | PRN
Start: 2016-08-04 — End: 2016-11-20

## 2016-08-13 ENCOUNTER — Telehealth (INDEPENDENT_AMBULATORY_CARE_PROVIDER_SITE_OTHER): Payer: Self-pay

## 2016-08-13 ENCOUNTER — Other Ambulatory Visit (HOSPITAL_BASED_OUTPATIENT_CLINIC_OR_DEPARTMENT_OTHER): Payer: Self-pay | Admitting: Family Medicine

## 2016-08-13 DIAGNOSIS — M545 Low back pain, unspecified: Secondary | ICD-10-CM

## 2016-08-13 DIAGNOSIS — M549 Dorsalgia, unspecified: Secondary | ICD-10-CM

## 2016-08-13 DIAGNOSIS — G8929 Other chronic pain: Secondary | ICD-10-CM

## 2016-08-13 NOTE — Telephone Encounter (Signed)
LM TO INFORM PT OF LSPINE/TSPINE MRI APPT @ [facility]. [id number] ARRIVE 1045 AM. CALL TO PRE-REGISTER [phone] OPT#2. ST

## 2016-08-24 ENCOUNTER — Ambulatory Visit (HOSPITAL_BASED_OUTPATIENT_CLINIC_OR_DEPARTMENT_OTHER): Payer: MEDICAID

## 2016-08-24 ENCOUNTER — Ambulatory Visit (HOSPITAL_BASED_OUTPATIENT_CLINIC_OR_DEPARTMENT_OTHER)
Admission: RE | Admit: 2016-08-24 | Discharge: 2016-08-24 | Disposition: A | Discharge status: Home / Self Care | Payer: MEDICAID | Source: Ambulatory Visit | Attending: Family Medicine | Admitting: Family Medicine

## 2016-08-24 DIAGNOSIS — M545 Low back pain, unspecified: Secondary | ICD-10-CM

## 2016-08-24 DIAGNOSIS — G8929 Other chronic pain: Secondary | ICD-10-CM

## 2016-08-24 DIAGNOSIS — M549 Dorsalgia, unspecified: Secondary | ICD-10-CM | POA: Insufficient documentation

## 2016-08-25 ENCOUNTER — Other Ambulatory Visit (INDEPENDENT_AMBULATORY_CARE_PROVIDER_SITE_OTHER): Payer: Self-pay

## 2016-08-25 MED ORDER — DICLOFENAC POTASSIUM(MIGRAINE) 50 MG PO PACK
50.0000 mg | PACK | Freq: Two times a day (BID) | ORAL | 2 refills | Status: DC
Start: 2016-08-25 — End: 2016-12-17

## 2016-08-28 ENCOUNTER — Telehealth (INDEPENDENT_AMBULATORY_CARE_PROVIDER_SITE_OTHER): Payer: Self-pay

## 2016-08-28 NOTE — Telephone Encounter (Signed)
Pt MRI thoracic spine and lumbosacral spine shows no abnormality.

## 2016-08-28 NOTE — Telephone Encounter (Signed)
PT INFORMED OF MRI RESULTS. ST

## 2016-09-17 ENCOUNTER — Ambulatory Visit (INDEPENDENT_AMBULATORY_CARE_PROVIDER_SITE_OTHER): Payer: Medicaid (Managed Care) | Admitting: Family

## 2016-09-17 ENCOUNTER — Encounter (INDEPENDENT_AMBULATORY_CARE_PROVIDER_SITE_OTHER): Payer: Self-pay | Admitting: Family

## 2016-09-17 ENCOUNTER — Other Ambulatory Visit
Admission: RE | Admit: 2016-09-17 | Discharge: 2016-09-17 | Disposition: A | Discharge status: Home / Self Care | Payer: Medicaid (Managed Care) | Source: Ambulatory Visit | Attending: Family | Admitting: Family

## 2016-09-17 VITALS — BP 110/84 | HR 91 | Temp 98.0°F | Resp 16 | Ht 63.0 in | Wt 138.0 lb

## 2016-09-17 DIAGNOSIS — R399 Unspecified symptoms and signs involving the genitourinary system: Secondary | ICD-10-CM

## 2016-09-17 DIAGNOSIS — N760 Acute vaginitis: Secondary | ICD-10-CM

## 2016-09-17 DIAGNOSIS — B9689 Other specified bacterial agents as the cause of diseases classified elsewhere: Secondary | ICD-10-CM

## 2016-09-17 LAB — VH POCT UA-AUTOMATED(UCC)
Glucose, UA POCT: 250 — AB
Ketones, UA POCT: 15 mg/dL — AB
Nitrite, UA POCT: POSITIVE — AB
PH, UA POCT: 5 (ref 4.6–8)
Protein, UA POCT: 100 mg/dL — AB
Specific Gravity, UA POCT: 1.02 mg/dL (ref 1.001–1.035)
Urobilinogen, UA POCT: 4 mg/dL — AB

## 2016-09-17 MED ORDER — SULFAMETHOXAZOLE-TRIMETHOPRIM 800-160 MG PO TABS
1.0000 | ORAL_TABLET | Freq: Two times a day (BID) | ORAL | 0 refills | Status: AC
Start: 2016-09-17 — End: 2016-09-22

## 2016-09-17 NOTE — Progress Notes (Signed)
Subjective:    Patient ID: Regina Adams is a 31 y.o. female.    Urinary Tract Infection Symptoms    This is a new problem. The current episode started in the past 7 days. The problem occurs every urination. The problem has been gradually worsening. The quality of the pain is described as aching and burning. The pain is at a severity of 10/10. There has been no fever. She is not sexually active. There is no history of pyelonephritis. Associated symptoms include frequency. Pertinent negatives include no chills, discharge, flank pain, hematuria, hesitancy, nausea, possible pregnancy, sweats, urgency or vomiting. Treatments tried: azo x last 2 days. The treatment provided no relief. There is no history of catheterization or recurrent UTIs.       The following portions of the patient's history were reviewed and updated as appropriate: allergies, current medications, past family history, past medical history, past social history, past surgical history and problem list.    Review of Systems   Constitutional: Negative for activity change, appetite change, chills and fever.   Gastrointestinal: Negative for nausea and vomiting.   Genitourinary: Positive for decreased urine volume, difficulty urinating, dysuria, frequency and pelvic pain. Negative for flank pain, hematuria, hesitancy, urgency and vaginal discharge.   All other systems reviewed and are negative.        Objective:    BP 110/84   Pulse 91   Temp 98 F (36.7 C) (Oral)   Resp 16   Ht 1.6 m (5\' 3" )   Wt 62.6 kg (138 lb)   LMP 09/13/2016   BMI 24.45 kg/m     Physical Exam   Constitutional: She is oriented to person, place, and time. Vital signs are normal. She appears well-developed and well-nourished. No distress.   HENT:   Head: Normocephalic.   Eyes: Right eye exhibits no discharge. Left eye exhibits no discharge.   Cardiovascular: Normal rate, regular rhythm and normal heart sounds.    Pulmonary/Chest: Effort normal and breath sounds normal. No  respiratory distress. She has no decreased breath sounds. She has no wheezes. She has no rhonchi. She has no rales.   Abdominal: Soft. Normal appearance. She exhibits no distension, no ascites and no mass. There is no hepatosplenomegaly. There is tenderness in the suprapubic area. There is no rigidity, no guarding and no CVA tenderness.   Neurological: She is alert and oriented to person, place, and time.   Skin: Skin is warm, dry and intact. She is not diaphoretic.   Psychiatric: She has a normal mood and affect. Her speech is normal.   Nursing note and vitals reviewed.     Results     Procedure Component Value Units Date/Time    [facility] POCT UA AUTO [[id number]]  (Abnormal) Collected:  09/17/16 1657     Updated:  09/17/16 1709     Color UA POCT Orange     Clarity UA POCT Turbid     Glucose, UA POCT =250 (A)     Bilirubin, UA POCT Moderate (A)     Ketones, UA POCT =15 (A) mg/dL      Specific Gravity, UA POCT 1.020 mg/dL      Blood, UA POCT  Moderate (A)     PH, UA POCT 5.0     Protein, UA POCT =100 (A) mg/dL      Urobilinogen, UA POCT 4.0 (A) mg/dL      Nitrite, UA POCT Positive (A)     Leukocytes, UA POCT Large (A)  Assessment and Plan:       Daianna was seen today for urinary tract infection symptoms.    Diagnoses and all orders for this visit:    UTI symptoms  -     [facility] POCT UA AUTO  -     sulfamethoxazole-trimethoprim (BACTRIM DS) 800-160 MG per tablet; Take 1 tablet by mouth 2 (two) times daily.for 5 days  -     Urine Culture; Future    -Discussed UA results: results derranged due to Azo use  -Discussed antibiotic treatment with bactrim  -will change antibiotic if needed based on C&S  -Discussed RTC or report to ER if new/worsening symptoms such as: intractable fever, nausea, vomiting, severe flank pain, sweats  -Follow up with PCP if symptoms lasting longer than expected  -Patient/guardian agreed to plan          Maree Erie, FNP  [facility]  09/17/2016  5:05 PM

## 2016-09-17 NOTE — Patient Instructions (Signed)
If urinary symptoms not improving in 48 hours please call back or return to clinic for re-evaluation.  If you have any signs of worsening infection return immediately for re-check, especially any upper back pain, fever, nausea or vomiting.    Go to the ER for any new or worsening symptoms that concern you.  Follow-up with your primary care doctor if your symptoms do not improve.      Urinary Tract Infections in Women  Urinary tract infections (UTIs) are most often caused by bacteria (germs). These bacteria enter the urinary tract. The bacteria may come from outside the body. Or they may travel from the skin outside the rectum or vagina into the urethra. Female anatomy makes it easier for bacteria from the bowel to enter a woman's urinary tract, which is the most common source of UTI. This means women develop UTIs more often than men. Pain in or around the urinary tract is a common UTI symptom. But the only way to know for sure if you have a UTI for the doctor to test your urine. The two tests that may be done are the urinalysis and urine culture.  Three Types of UTIs   Cystitis: A bladder infection (cystitis) is the most common UTI in women. You may have urgent or frequent urination. You may also have pain, burning when you urinate, and bloody urine.   Urethritis: This is an inflamed urethra, which is the tube that carries urine from the bladder to outside the body. You may have lower stomach or back pain. You may also have urgent or frequent urination.   Pyelonephritis: This is a kidney infection. If not treated, it can be serious and damage your kidneys. In severe cases, you may be hospitalized. You may have a fever and upper back pain.  Medications to Treat a UTI  Most UTIs are treated with antibiotics. These kill the bacteria. The length of time you need to take them depends on the type of infection. It may be as short as 3 days. If you have repeated UTIs, a low-dose  antibiotic may be needed for several months. Take antibiotics exactly as directed. Don't stop taking them until all of the medication is gone. If you stop taking the antibiotic too soon, the infection may not go away, and you may develop a resistance to the antibiotic. This can make it much harder to treat.  Lifestyle Changes to Treat and Prevent UTIs  The lifestyle changes below will help get rid of your UTI. They may also help prevent future UTIs.   Drink plenty of fluids. This includes water, juice, or other caffeine-free drinks. Fluids help flush bacteria out of your body.   Empty your bladder. Always empty your bladder when you feel the urge to urinate. And always urinate before going to sleep. Urine that stays in your bladder can lead to infection. Try to urinate before and after sex as well.   Practice good personal hygiene. Wipe yourself from front to back after using the toilet. This helps keep bacteria from getting into the urethra.   Use condoms during sex. These help prevent UTIs caused by sexually transmitted bacteria. Also, avoid using spermicides during sex. These can increase the risk of UTIs. Choose other forms of birth control instead. For women who tend to get UTIs after sex, a low-dose of a preventive antibiotic may be used. Be sure to discuss this option with your health care provider.   Follow up with your health care provider as   directed. He or she may test to make sure the infection has cleared. If necessary, additional treatment may be started.   2000-2014 [employer], [address], [place] [address]. All rights reserved. This information is not intended as a substitute for professional medical care. Always follow your healthcare professional's instructions.

## 2016-09-17 NOTE — Progress Notes (Signed)
Urine culture collected from patient by myself.This staff member prepared specimen, applied no charges and placed in courier box for lab courier to pick up.

## 2016-09-21 ENCOUNTER — Encounter (INDEPENDENT_AMBULATORY_CARE_PROVIDER_SITE_OTHER): Payer: Self-pay

## 2016-09-21 ENCOUNTER — Other Ambulatory Visit
Admission: RE | Admit: 2016-09-21 | Discharge: 2016-09-21 | Disposition: A | Discharge status: Home / Self Care | Payer: Medicaid (Managed Care) | Source: Ambulatory Visit | Attending: Family | Admitting: Family

## 2016-09-21 ENCOUNTER — Ambulatory Visit (INDEPENDENT_AMBULATORY_CARE_PROVIDER_SITE_OTHER): Payer: Medicaid (Managed Care) | Admitting: Family

## 2016-09-21 ENCOUNTER — Telehealth (INDEPENDENT_AMBULATORY_CARE_PROVIDER_SITE_OTHER): Payer: Self-pay

## 2016-09-21 ENCOUNTER — Emergency Department (HOSPITAL_BASED_OUTPATIENT_CLINIC_OR_DEPARTMENT_OTHER): Payer: MEDICAID

## 2016-09-21 ENCOUNTER — Emergency Department (HOSPITAL_BASED_OUTPATIENT_CLINIC_OR_DEPARTMENT_OTHER)
Admission: EM | Admit: 2016-09-21 | Discharge: 2016-09-21 | Disposition: A | Discharge status: Home / Self Care | Payer: MEDICAID | Attending: Emergency Medicine | Admitting: Emergency Medicine

## 2016-09-21 ENCOUNTER — Encounter (HOSPITAL_BASED_OUTPATIENT_CLINIC_OR_DEPARTMENT_OTHER): Payer: Self-pay

## 2016-09-21 VITALS — BP 98/72 | HR 108 | Temp 98.8°F | Resp 14 | Ht 63.0 in | Wt 138.3 lb

## 2016-09-21 DIAGNOSIS — F17210 Nicotine dependence, cigarettes, uncomplicated: Secondary | ICD-10-CM | POA: Insufficient documentation

## 2016-09-21 DIAGNOSIS — J45909 Unspecified asthma, uncomplicated: Secondary | ICD-10-CM | POA: Insufficient documentation

## 2016-09-21 DIAGNOSIS — R1030 Lower abdominal pain, unspecified: Secondary | ICD-10-CM | POA: Insufficient documentation

## 2016-09-21 DIAGNOSIS — N132 Hydronephrosis with renal and ureteral calculous obstruction: Secondary | ICD-10-CM | POA: Insufficient documentation

## 2016-09-21 DIAGNOSIS — R51 Headache: Secondary | ICD-10-CM | POA: Insufficient documentation

## 2016-09-21 DIAGNOSIS — R319 Hematuria, unspecified: Secondary | ICD-10-CM

## 2016-09-21 DIAGNOSIS — R300 Dysuria: Secondary | ICD-10-CM

## 2016-09-21 DIAGNOSIS — R509 Fever, unspecified: Secondary | ICD-10-CM

## 2016-09-21 LAB — VH POCT UA-AUTOMATED(UCC)
Glucose, UA POCT: NEGATIVE
Nitrite, UA POCT: NEGATIVE
PH, UA POCT: 6 (ref 4.6–8)
Protein, UA POCT: 100 mg/dL — AB
Specific Gravity, UA POCT: >=1.03 mg/dL (ref 1.001–1.035)
Urine Leukocytes POCT: NEGATIVE
Urobilinogen, UA POCT: 1 mg/dL

## 2016-09-21 LAB — POCT CHEM8
Anion Gap, VH POCT: 14 mmol/L (ref 10–20)
BUN, VH POCT: 14 mg/dL (ref 6–20)
Chloride, VH POCT: 102 mmol/L (ref 98–112)
Creatinine, VH POCT: 0.7 mg/dL (ref 0.6–1.1)
Glucose, VH POCT: 98 mg/dL (ref 70–99)
Ionized Calcium, VH POCT: 4.9 mg/dL (ref 4.35–5.1)
Potassium, VH POCT: 4.5 mmol/L (ref 3.5–5.3)
Sodium, VH POCT: 139 mmol/L (ref 135–145)
Total Co2, VH POCT: 28 mmol/L (ref 24–29)

## 2016-09-21 LAB — VH AMB POCT CBC W/ AUTO DIFF
GR #, VH POCT: 2.6 10*3/uL
GR %, VH POCT: 50.6 % (ref 42–78)
HCT, VH POCT: 38.4 % (ref 33–48)
HGB, VH POCT: 13.5 g/dL (ref 11–16)
LY #, VH POCT: 2.1 10*3/uL
MCH, VH POCT: 33.4 pg — AB (ref 27–31)
MCHC, VH POCT: 35.2 g/dL (ref 33–37)
MCV, VH POCT: 95.1 fL (ref 81–99)
MO #, VH POCT: 0.4 10*3/uL
MONO %, VH POCT: 7.6 % (ref 0–10)
MPV, VH POCT: 7.1 fL
PLT, VH POCT: 253 10*3/uL (ref 120–450)
RBC, VH POCT: 4.03 10*6/uL (ref 3.2–5.1)
RDW, VH POCT: 12.2 %
VH POCT, Percent Lymphocyte: 41.8 % (ref 14–46)
WBC, VH POCT: 5.1 10*3/uL (ref 4.2–10)

## 2016-09-21 LAB — URINALYSIS WITH MICROSCOPIC REFLEX IF INDICATED BMC/JMC ONLY
BILIRUBIN: NEGATIVE mg/dL
GLUCOSE: NEGATIVE mg/dL
KETONES: NEGATIVE mg/dL
NITRITE: NEGATIVE
PH: 7 (ref <8.0)
PROTEIN: NEGATIVE mg/dL
SPECIFIC GRAVITY: 1.01 (ref <1.022)
UROBILINOGEN: 0.2 mg/dL (ref <=2.0)

## 2016-09-21 LAB — URINALYSIS, MICROSCOPIC

## 2016-09-21 LAB — HCG, URINE QUALITATIVE, PREGNANCY
HCG URINE QUALITATIVE: UNDETERMINED — AB
SPECIFIC GRAVITY: 1.01 (ref <1.022)

## 2016-09-21 LAB — HCG, SERUM QUALITATIVE, PREGNANCY: HCG, QUALITATIVE SERUM: NEGATIVE

## 2016-09-21 MED ORDER — PHENAZOPYRIDINE 100 MG TABLET
100.00 mg | ORAL_TABLET | ORAL | Status: AC
Start: 2016-09-21 — End: 2016-09-21
  Administered 2016-09-21: 100 mg via ORAL
  Filled 2016-09-21: qty 1

## 2016-09-21 MED ORDER — NAPROXEN SODIUM 550 MG TABLET: 550 mg | Tab | Freq: Two times a day (BID) | ORAL | 0 refills | 0 days | Status: DC

## 2016-09-21 MED ORDER — METRONIDAZOLE 500 MG PO TABS
500.0000 mg | ORAL_TABLET | Freq: Two times a day (BID) | ORAL | 0 refills | Status: DC
Start: 2016-09-21 — End: 2016-09-21

## 2016-09-21 NOTE — ED Nurses Note (Signed)
pt ambulatory to ct

## 2016-09-21 NOTE — ED Provider Notes (Signed)
Lindsay Mccullough, Jentry Mcqueary M, MD  [facility]  Emergency Department Visit Note    Date:  09/21/2016  Primary care provider:  Gean BirchwoodPhilomela Tabuena, MD  Means of arrival:  private car  History obtained from: patient  History limited by: none    Chief Complaint:  Hematuria    HISTORY OF PRESENT ILLNESS     Lindsay Mccullough, date of birth 01/09/1986, is a 31 y.o. female who presents to the Emergency Department complaining of constant hematuria that started three days ago and has worsened into today. She notes that she has also experienced a headache and lower abdominal pain along with this, which she rates the severity of pain as an 8 out of 10. The patient explains that she went to Urgent Care today regarding the symptoms and was started on antibiotics without relief (after one dose). She went back to Urgent Care today and they performed blood work then started her on a different antibiotic.     The patient affirms nausea. She denies vomiting, diarrhea, chest pain, shortness of breath, or dysuria.     REVIEW OF SYSTEMS     The pertinent positive and negative symptoms are as per HPI. All other systems reviewed and are negative.     PATIENT HISTORY     Past Medical History:  Past Medical History:   Diagnosis Date   . Asthma    . Dysplasia of cervix    . H/O seasonal allergies      Past Surgical History:  Past Surgical History:   Procedure Laterality Date   . Hx dilation and curettage     . Hx no surgical procedures       Family History:  Family Medical History     Problem Relation (Age of Onset)    Cirrhosis Father    Colon Cancer Other    Congestive Heart Failure Mother    Diabetes Paternal Grandmother    Hearing Loss Mother    Heart Attack Mother    Heart Disease Mother    High Cholesterol Mother, Father    Hypertension Mother, Father, Brother        Social History:  Social History   Substance Use Topics   . Smoking status: Current Every Day Smoker     Packs/day: 0.25     Start date: 11/20/1998     Last attempt to quit:  10/13/2015   . Smokeless tobacco: Never Used   . Alcohol use No     History   Drug Use No     Medications:  Outpatient Prescriptions Marked as Taking for the 09/21/16 encounter [facility](Hospital Encounter)   Medication Sig   . cephalexin (KEFLEX ORAL) Take by mouth   . sulfamethoxazole/trimethoprim (BACTRIM ORAL) Take 800 mg by mouth     Allergies:  Allergies   Allergen Reactions   . Amoxicillin Anaphylaxis   . Spironolactone Rash and Hives/ Urticaria   . Iodine      Blisters on left arm   . Penicillins Hives/ Urticaria     PHYSICAL EXAM     Vitals:  Filed Vitals:    09/21/16 2019   BP: 115/69   Pulse: 94   Resp: 18   Temp: 36.8 C (98.2 F)   SpO2: 100%     Pulse ox  100% on None (Room Air) interpreted by me as: Normal    Constitutional: The patient is in no acute distress.     Head: Normocephalic and atraumatic.   ENT: Moist mucous  membranes. No erythema or exudates in the oropharynx.  Eyes: EOM are normal. Pupils are equal, round, and reactive to light. No scleral icterus.   Neck: Neck supple. No meningismus.  Cardiovascular: Normal rate and regular rhythm. No murmur heard. 2+ distal pulses all 4 extremities.  Pulmonary/Chest: Effort normal and breath sounds normal.   Abdominal: Soft. No distension. There is no tenderness.   Back: There is no CVA tenderness.   Musculoskeletal: Normal range of motion. No edema and no tenderness. No clubbing or cyanosis.  Neurological: Patient is alert and oriented to person, place, and time. Strength and sensation normal in all extremities. Normal facial symmetry and speech.   Skin: Skin is warm and dry. No rash noted.     DIAGNOSTIC STUDIES     Labs:    Results for orders placed or performed during the hospital encounter of 09/21/16   URINALYSIS WITH MICROSCOPIC REFLEX IF INDICATED [facility] ONLY   Result Value Ref Range    COLOR Light Yellow Light Yellow, Straw, Yellow    APPEARANCE Clear Clear    PH 7.0 <8.0    LEUKOCYTES Trace (A) Negative WBCs/uL    NITRITE Negative Negative    PROTEIN  Negative Negative mg/dL    GLUCOSE Negative Negative mg/dL    KETONES Negative Negative mg/dL    UROBILINOGEN 0.2  <=9.1 mg/dL    BILIRUBIN Negative Negative mg/dL    BLOOD Trace (A) Negative mg/dL    SPECIFIC GRAVITY 4.782 <1.022   URINALYSIS, MICROSCOPIC   Result Value Ref Range    RBCS 0-2 0 - 2 /hpf    WBCS 2-5 (A) 0 - 2 /hpf    BACTERIA Slight (A) None /hpf    SQUAMOUS EPITHELIAL 2-5 (A) 0 - 2 /hpf    TRANSITIONAL EPITHELIAL 0-2 0 - 2 /hpf    MUCOUS Slight (A) None, Light /hpf    CALCIUM OXALATE CRYSTALS Slight (A) None /hpf   HCG, URINE QUALITATIVE, PREGNANCY   Result Value Ref Range    HCG URINE QUALITATIVE Indeterminate (A) Negative    SPECIFIC GRAVITY 1.010 <1.022   HCG, SERUM QUALITATIVE, PREGNANCY   Result Value Ref Range    HCG, QUALITATIVE SERUM Negative Negative     Labs reviewed and interpreted by me.    Radiology:    CT ABDOMEN PELVIS WO IV CONTRAST: 5 mm left UVJ stone. Minimal left hydronephrosis. Multiple nonobstructing stones in both kidneys.  Radiological imaging interpreted by radiologist and independently reviewed by me.    ED PROGRESS NOTE / MEDICAL DECISION MAKING     Old records reviewed by me:  I have reviewed the nurse's notes. I have reviewed the patient's problem list. I reviewed prior available records and pertinent past medical history.    Orders Placed This Encounter   . CT ABDOMEN PELVIS WO IV CONTRAST   . URINALYSIS WITH MICROSCOPIC REFLEX IF INDICATED [facility] ONLY   . ADDITIONAL MICRO TESTING REQUEST - URINE CULTURE   . URINALYSIS, MICROSCOPIC   . HCG, URINE QUALITATIVE, PREGNANCY   . HCG, SERUM QUALITATIVE, PREGNANCY   . phenazopyridine (PYRIDIUM) tablet     UA ordered.    9:02 PM: Initial evaluation is complete at this time. I discussed with the patient that I would treat with Pyridium PO. I will proceed with UA to further evaluate. Patient is agreeable with the treatment plan at this time.    9:30 PM: UA reviewed. I will order an HCG and CT abdomen/pelvis.     10:54  PM: CT  reviewed.     11:03 PM: On recheck, the patient is feeling improved after treatment. I explained the results of the diagnostic studies. Patient will be discharged with Anaprox PO. Patient is to follow up with Wandra Feinstein, MD. I discussed the diagnosis, disposition, and follow-up plan. The patient understood and is in accordance with the treatment plan at this time. Patient is to return here to the Emergency Department if new or worsening symptoms appear. All of their questions have been answered to their satisfaction. The patient is in stable condition at the time of discharge.       Pre-Disposition Vitals:  Filed Vitals:    09/21/16 2019   BP: 115/69   Pulse: 94   Resp: 18   Temp: 36.8 C (98.2 F)   SpO2: 100%     CLINICAL IMPRESSION     1. 5 mm left UVJ kidney stone    DISPOSITION/PLAN     Discharged        Prescriptions:     New Prescriptions    NAPROXEN SODIUM (ANAPROX) 550 MG ORAL TABLET    Take 1 Tab (550 mg total) by mouth Twice daily with food     Follow-Up:     Wandra Feinstein, MD  [address]  [address]  [place] [address]  [phone]    Call  As needed for persistent symptoms    Condition at Disposition: Stable      SCRIBE ATTESTATION STATEMENT  I Lonzo Hilbun, SCRIBE scribed for Lindsay Crouch, MD on 09/21/2016 at 9:02 PM.     Documentation assistance provided for Lindsay Crouch, MD  by Lonzo Spano, SCRIBE. Information recorded by the scribe was done at my direction and has been reviewed and validated by me Renee Rival, Araceli Bouche, MD.

## 2016-09-21 NOTE — ED Nurses Note (Signed)
pt returned from ct

## 2016-09-21 NOTE — ED Nurses Note (Signed)
Discharged by criteria per RN.

## 2016-09-21 NOTE — ED Triage Notes (Addendum)
Peeing blood for the past 3 days.  Went urgent care and they gave me abx.  Now continues to bleed.  Having pain in the lower abd.  Also having headache and feeling tired.  Went back to urgent care again today.  They took blood work and gave me another abx.  Called and made a follow up with her doctor Wednesday at 1130.

## 2016-09-21 NOTE — Progress Notes (Signed)
Date Specimen Drawn: 09/21/2016  Time Specimen Drawn: 1352  Test(s) Ordered:  CBC, iSTAT  Patient's Tolerance: Good  Location Specimen Drawn: Left antecubital  Katheren Shams Shyasia Funches  2:03 PM

## 2016-09-21 NOTE — Addendum Note (Signed)
Addended by: Sherran Needs on: 09/21/2016 02:38 PM     Modules accepted: Orders

## 2016-09-21 NOTE — Addendum Note (Signed)
Addended by: Cyndi Lennert on: 09/21/2016 10:23 AM     Modules accepted: Orders

## 2016-09-21 NOTE — Patient Instructions (Addendum)
Pick up your medication, finish all of your medication  Take all of your medication  Increase fluid intake  Antipyretics for any pain or fever  -Follow up with PCP for blood in urine  -RTC or ER if new/worsening symptoms  -Pt/guardian agreed to plan    Hematuria: Possible Causes    Many things can lead to blood in the urine (hematuria). The blood may be seen with the eye (macroscopic or gross hematuria). Or it may only be seen when the urine is looked at under a microscope (microscopic hematuria). Some of the most common causes of blood in the urine are listed below. Often, no cause for the blood can be found. This is called idiopathic hematuria.   Kidney or bladder stones are collections of crystals. They form in the urine. Stones may be found anywhere in the urinary tract. But they form most often in the kidneys or bladder. In addition to blood in the urine, they can cause severe pain.   BPH stands for benign prostatic hyperplasia. It is enlargement of the prostate gland. It happens as men age. BPH often causes problems with urination. It sometimes causes blood in the urine.   A urinary tract infection (UTI) is due to bacteria growing in the urinary tract. It can cause blood in the urine. Other symptoms include burning or pain with urination. You may need to urinate often or urgently. You may also have a fever.   Damage to the urinary tract may cause blood in the urine. This damage may be due to a blow or accident. It may also result from the use of a urinary catheter. Very hard exercise may sometimes irritate the urinary tract and cause bleeding.   Cancer may occur anywhere in the urinary tract. A tumor may sometimes cause no symptoms other than bleeding.    Other possible causes of bleeding include:   Prostatitis (infection of the prostate gland)   Taking anticoagulants   Blockage in the urinary tract   Disease or inflammation of the kidney   Cystic diseases of the kidneys   Sickle cell  anemia   Vigorous exercise   Endometriosis  Date Last Reviewed: 01/31/2015   2000-2017 [employer] [address] [place], [place] [address]. All rights reserved. This information is not intended as a substitute for professional medical care. Always follow your healthcare professional's instructions.

## 2016-09-21 NOTE — Progress Notes (Signed)
Subjective:    Patient ID: Regina Adams is a 31 y.o. female.    Patient states temperature of 100.1 - 100.3 degrees for 5 days      Urinary Tract Infection Symptoms    This is a recurrent problem. The current episode started in the past 7 days (5 days). The problem occurs intermittently. The problem has been waxing and waning. The quality of the pain is described as burning. The pain is at a severity of 0/10. The patient is experiencing no pain. The maximum temperature recorded prior to her arrival was 100 - 100.9 F. There is no history of pyelonephritis. Associated symptoms include frequency, hematuria, hesitancy, sweats and urgency. Pertinent negatives include no chills, discharge, flank pain, nausea, possible pregnancy or vomiting. She has tried antibiotics (5 days bactrim for UTI, haven't startd flagyl for BV) for the symptoms. The treatment provided moderate relief.       The following portions of the patient's history were reviewed and updated as appropriate: allergies, current medications, past family history, past medical history, past social history, past surgical history and problem list.    Review of Systems   Constitutional: Negative for chills.   Gastrointestinal: Negative for nausea and vomiting.   Genitourinary: Positive for dysuria, frequency, hematuria, hesitancy and urgency. Negative for flank pain.   All other systems reviewed and are negative.        Objective:    BP 98/72   Pulse (!) 108   Temp 98.8 F (37.1 C) (Oral)   Resp 14   Ht 1.6 m (5\' 3" )   Wt 62.7 kg (138 lb 4.8 oz)   LMP 09/13/2016   BMI 24.50 kg/m     Physical Exam   Constitutional: She is oriented to person, place, and time. Vital signs are normal. She appears well-developed and well-nourished. No distress.   HENT:   Head: Normocephalic and atraumatic.   Right Ear: Hearing, tympanic membrane, external ear and ear canal normal.   Left Ear: Hearing, tympanic membrane, external ear and ear canal normal.   Nose: Nose  normal. Right sinus exhibits no maxillary sinus tenderness and no frontal sinus tenderness. Left sinus exhibits no maxillary sinus tenderness and no frontal sinus tenderness.   Mouth/Throat: Uvula is midline, oropharynx is clear and moist and mucous membranes are normal. No oropharyngeal exudate.   Eyes: Pupils are equal, round, and reactive to light.   Neck: Neck supple.   Cardiovascular: Normal rate, regular rhythm and normal heart sounds.    No murmur heard.  Pulmonary/Chest: Effort normal and breath sounds normal. No respiratory distress.   Abdominal: Soft. Normal appearance and bowel sounds are normal. She exhibits no distension and no mass. There is no hepatosplenomegaly, splenomegaly or hepatomegaly. There is no tenderness. There is no rigidity, no rebound, no guarding, no CVA tenderness, no tenderness at McBurney's point and negative Murphy's sign.   Neurological: She is alert and oriented to person, place, and time.   Skin: Skin is warm and dry. Capillary refill takes less than 2 seconds. She is not diaphoretic.   Nursing note and vitals reviewed.       Labs  Recent Results (from the past 24 hour(s))   VH POCT UA AUTO    Collection Time: 09/21/16  1:34 PM   Result Value Ref Range    Color UA POCT Other     Clarity UA POCT Cloudy     Glucose, UA POCT Negative Negative    Bilirubin, UA POCT Moderate (  A) Negative    Ketones, UA POCT Trace (A) Negative mg/dL    Specific Gravity, UA POCT >=1.030 1.001 - 1.035 mg/dL    Blood, UA POCT  Large (A) Negative    PH, UA POCT 6.0 4.6 - 8    Protein, UA POCT =100 (A) Negative mg/dL    Urobilinogen, UA POCT 1.0 0.2, 1.0 mg/dL    Nitrite, UA POCT Negative Negative    Leukocytes, UA POCT Negative Negative   VH POCT I-STAT CHEM8    Collection Time: 09/21/16  1:52 PM   Result Value Ref Range    Sodium, VH POCT 139 135 - 145 mmol/L    Potassium, VH POCT 4.5 3.5 - 5.3 mmol/L    Chloride, VH POCT 102 98 - 112 mmol/L    Ionized Calcium, VH POCT 4.9 4.35 - 5.1 mg/dL    Total Co2,  VH POCT 28 24 - 29 mmol/L    Glucose, VH POCT 98 70 - 99 mg/dL    BUN, VH POCT 14 6 - 20 mg/dL    Creatinine, VH POCT 0.7 0.6 - 1.1 mg/dL    Anion Gap, VH POCT 14 10 - 20 mmol/L   VH POCT CBC W/ AUTO DIFF    Collection Time: 09/21/16  1:52 PM   Result Value Ref Range    WBC, VH POCT 5.1 4.2 - 10 x10^3/uL    VH POCT, Percent Lymphocyte 41.8 14 - 46 %    MONO %, VH POCT 7.6 0 - 10 %    GR %, VH POCT 50.6 42 - 78 %    LY #, VH POCT 2.1 x10^3/uL    MO #, VH POCT 0.4 x10^3/uL    GR #, VH POCT 2.6 x10^3/uL    RBC, VH POCT 4.03 3.2 - 5.1 x10^6/uL    HGB, VH POCT 13.5 11 - 16 g/dL    HCT, VH POCT 96.2 33 - 48 %    MCV, VH POCT 95.1 81 - 99 fL    MCH, VH POCT 33.4 (A) 27 - 31 pg    MCHC, VH POCT 35.2 33 - 37 g/dL    RDW, VH POCT 95.2 %    PLT, VH POCT 253 120 - 450 x10^3/uL    MPV, VH POCT 7.1 fL       Radiology  No results found.        Assessment and Plan:       Regina Adams was seen today for fever and dysuria.    Diagnoses and all orders for this visit:    Dysuria  -     VH POCT UA AUTO    Temperature elevated  -     VH POCT I-STAT CHEM8  -     VH POCT CBC W/ AUTO DIFF    Hematuria, unspecified type    Take all of your medication for BV  Finish your medication prescribed for UTI  Increase fluid intake  Antipyretics for any pain or fever  -Follow up with PCP for hematuria  -RTC or ER if new/worsening symptoms  -Pt/guardian agreed to plan          Etter Sjogren, NP  [facility]  09/21/2016  2:12 PM

## 2016-09-21 NOTE — Telephone Encounter (Signed)
-----   Message from Etter Sjogren, NP sent at 09/21/2016 10:25 AM EDT -----  Continue current antibiotic, patient positive for BV, sent medication to pharmacy for patient, follow up with PCP

## 2016-09-21 NOTE — Telephone Encounter (Signed)
Courtesy call attempted. Message left for patient to call our office back to discuss lab results.  Regina Adams  11:17 AM

## 2016-09-21 NOTE — Progress Notes (Signed)
Urine culture collected from patient by JS. This staff member prepared specimen, no charges placed due to insurance and placed in courier box for lab courier to pick up.  Chancey Cullinane Jaeson Molstad  2:37 PM

## 2016-09-23 ENCOUNTER — Inpatient Hospital Stay (INDEPENDENT_AMBULATORY_CARE_PROVIDER_SITE_OTHER): Payer: Medicaid (Managed Care) | Admitting: Family Medicine

## 2016-09-23 ENCOUNTER — Telehealth (INDEPENDENT_AMBULATORY_CARE_PROVIDER_SITE_OTHER): Payer: Self-pay

## 2016-09-23 NOTE — Telephone Encounter (Signed)
Left voicemail for patient to return call regarding lab results.   Regina Adams M Poetry Cerro   9:36 AM

## 2016-09-24 ENCOUNTER — Telehealth (INDEPENDENT_AMBULATORY_CARE_PROVIDER_SITE_OTHER): Payer: Self-pay

## 2016-09-24 NOTE — Telephone Encounter (Signed)
Left voicemail for patient to return call regarding lab results.   Regina Adams M Regina Adams  9:23 AM

## 2016-09-25 ENCOUNTER — Telehealth (INDEPENDENT_AMBULATORY_CARE_PROVIDER_SITE_OTHER): Payer: Self-pay

## 2016-09-25 NOTE — Telephone Encounter (Signed)
Left voicemail for patient to return call regarding lab results.   Regina Adams M Regina Adams  9:03 AM

## 2016-10-02 ENCOUNTER — Other Ambulatory Visit (INDEPENDENT_AMBULATORY_CARE_PROVIDER_SITE_OTHER): Payer: Self-pay

## 2016-10-03 MED ORDER — GABAPENTIN 100 MG PO CAPS
100.0000 mg | ORAL_CAPSULE | Freq: Three times a day (TID) | ORAL | 0 refills | Status: DC
Start: 2016-10-03 — End: 2016-10-12

## 2016-10-12 ENCOUNTER — Other Ambulatory Visit (INDEPENDENT_AMBULATORY_CARE_PROVIDER_SITE_OTHER): Payer: Self-pay

## 2016-10-12 MED ORDER — GABAPENTIN 100 MG PO CAPS
100.0000 mg | ORAL_CAPSULE | Freq: Three times a day (TID) | ORAL | 5 refills | Status: DC
Start: 2016-10-12 — End: 2017-05-22

## 2016-10-13 ENCOUNTER — Ambulatory Visit (INDEPENDENT_AMBULATORY_CARE_PROVIDER_SITE_OTHER): Payer: Medicaid (Managed Care) | Admitting: Family Medicine

## 2016-10-13 VITALS — BP 100/70 | HR 113 | Temp 98.8°F | Resp 16 | Ht 63.0 in | Wt 137.4 lb

## 2016-10-13 DIAGNOSIS — M545 Low back pain, unspecified: Secondary | ICD-10-CM

## 2016-10-13 DIAGNOSIS — M546 Pain in thoracic spine: Secondary | ICD-10-CM

## 2016-10-13 NOTE — Progress Notes (Signed)
Subjective:    Patient ID: Regina Adams is a 31 y.o. female.    She is here for FU back pain. She came with her mother.   Still having issues on her upper back and mid back. Finished PT without improvement inspite of doing exercises at home.  Had MRI back in June which is unremarkable. Already referred to back specialist.   Taking Diclofenac and Tizanidine barely helps. No neuropathies. Wants to see chiropractor instead.       Past Medical History:   Diagnosis Date   . Anemia    . Anxiety    . Asthma without status asthmaticus    . Depression    . Hormone disorder    . 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 NO PAST SURGERIES       Family History   Problem Relation Age of Onset   . Hyperlipidemia Mother    . Hypertension Mother    . Heart attack Mother    . Arthritis Mother    . Cancer Father    . Hypertension Father    . Hyperlipidemia Father    . Diabetes Father    . Hypertension Maternal Uncle    . Diabetes Maternal Grandmother      Social History     Social History   . Marital status: Divorced     Spouse name: N/A   . Number of children: N/A   . Years of education: N/A     Occupational History   . Not on file.     Social History Main Topics   . Smoking status: Current Every Day Smoker     Packs/day: 1.00     Types: Cigarettes     Last attempt to quit: 11/18/2015   . Smokeless tobacco: Former Neurosurgeon     Quit date: 09/19/2013   . Alcohol use No   . Drug use: No   . Sexual activity: No     Other Topics Concern   . Not on file     Social History Narrative   . No narrative on file     The following portions of the patient's history were reviewed and updated as appropriate: allergies, current medications, past family history, past medical history, past social history, past surgical history and problem list.    Review of Systems   Constitutional: Positive for fatigue.   Respiratory: Negative for cough, chest tightness, shortness of breath and wheezing.     Cardiovascular: Negative for chest pain.   Musculoskeletal: Positive for arthralgias, back pain, myalgias, neck pain and neck stiffness.   Neurological: Negative for dizziness, weakness, numbness and headaches.           Objective:    Physical Exam   Constitutional: She is oriented to person, place, and time. She appears well-developed and well-nourished. No distress.   Not in distressed. Sitting with her legs crossed.    Neck: Normal range of motion. Neck supple. No thyromegaly present.   Cardiovascular: Regular rhythm and normal heart sounds.    Pulmonary/Chest: Effort normal. She has no wheezes. She has no rales.   Musculoskeletal: Normal range of motion. She exhibits no edema or tenderness.   Lymphadenopathy:     She has no cervical adenopathy.   Neurological: She is alert and oriented to person, place, and time. No cranial nerve deficit or sensory deficit.  Skin: Skin is warm and dry.   Nursing note and vitals reviewed.        Assessment:       1. Bilateral low back pain without sciatica, unspecified chronicity    2. Bilateral thoracic back pain, unspecified chronicity          Plan:       Healthy back practices. She needs to apply the PT exercises she learned for her back. Also improve posture and to not lift heavy for now. Lower back exercises. Referral to back specialist needs FU.

## 2016-10-29 ENCOUNTER — Ambulatory Visit
Admission: RE | Admit: 2016-10-29 | Discharge: 2016-10-29 | Disposition: A | Discharge status: Home / Self Care | Payer: MEDICAID | Source: Ambulatory Visit | Attending: Urology | Admitting: Urology

## 2016-10-29 ENCOUNTER — Ambulatory Visit (INDEPENDENT_AMBULATORY_CARE_PROVIDER_SITE_OTHER): Payer: MEDICAID | Admitting: Urology

## 2016-10-29 ENCOUNTER — Encounter (INDEPENDENT_AMBULATORY_CARE_PROVIDER_SITE_OTHER): Payer: Self-pay | Admitting: Urology

## 2016-10-29 VITALS — BP 117/81 | HR 120 | Temp 97.7°F | Ht 63.0 in | Wt 136.6 lb

## 2016-10-29 DIAGNOSIS — N201 Calculus of ureter: Secondary | ICD-10-CM | POA: Insufficient documentation

## 2016-10-29 DIAGNOSIS — N200 Calculus of kidney: Secondary | ICD-10-CM

## 2016-10-29 DIAGNOSIS — I878 Other specified disorders of veins: Secondary | ICD-10-CM | POA: Insufficient documentation

## 2016-10-29 NOTE — Patient Instructions (Signed)
Patient counseled on risks, benefits and alternatives to ESWL and all questions were answered.  Risks include, but are not limited to: bleeding, infection, damage to urethra, bladder or ureter which may require prolonged ureteral stenting or Foley catheter drainage or a secondary surgery.  Alternatives include continued trial of passage or ureteroscopy.  The patient agrees that ureteroscopy should be performed if the stone cannot be visualized for ESWL.  The patient expressed understanding of this counseling and wishes to proceed.

## 2016-10-29 NOTE — Assessment & Plan Note (Signed)
Plan for shockwave lithotripsy on the left-sided lower pole stone as long as KUB confirms at the ureterovesical junction stone has passed.  If the UVJ stone is still there, she will require ureteroscopy for removal of that stone before considering shockwave for the upper tract stone.  Patient counseled on risks, benefits and alternatives to ESWL and all questions were answered.  Risks include, but are not limited to: bleeding, infection, damage to urethra, bladder or ureter which may require prolonged ureteral stenting or Foley catheter drainage or a secondary surgery.  Alternatives include continued trial of passage or ureteroscopy.  The patient agrees that ureteroscopy should be performed if the stone cannot be visualized for ESWL.  The patient expressed understanding of this counseling and wishes to proceed.

## 2016-10-29 NOTE — H&P (Signed)
[facility]  [address]  [place], [place] [id number]    New Office Visit    SUBJECTIVE:    Patient ID:   Lindsay PeaksJessica Mccullough, 31 y.o., female      Chief Complaint:   Chief Complaint   Patient presents with    Kidney Stones       History of Present Illness:    31 y.o. female presents for evaluation of nephrolithiasis.  Patient developed severe, sharp, intermittent, left-sided flank pain associated with urinary frequency and urgency last month.  She also had associated hematuria.  After 4-5 days of the symptoms she went emergency department and was diagnosed with a 5 mm left ureterovesical junction stone.  The CT also showed hydronephrosis and a 6-7 mm lower pole stone.  Both stones are easily visible on scout film.  Her pain resolved completely as did her bladder symptoms about 2 weeks ago, but she did not notice passage of any stones or stone fragments.  This is her 1st ever stone episode in her life.    Review of systems is positive for fevers, chills, weight loss, fatigue, sore throat, chest pain, shortness of breath, abdominal pain, joint pain, back pain, seasonal allergies and depression.    Past Medical History:  Past Medical History:   Diagnosis Date    Asthma     Dysplasia of cervix     H/O seasonal allergies            Past Surgical History:  Past Surgical History:   Procedure Laterality Date    HX DILATION AND CURETTAGE      HX NO SURGICAL PROCEDURES             Allergies:  Allergies   Allergen Reactions    Amoxicillin Anaphylaxis    Dilaudid [Hydromorphone] Shortness of Breath    Spironolactone Rash and Hives/ Urticaria    Iodine      Blisters on left arm    Penicillins Hives/ Urticaria       Home Medications:  Prior to Admission medications    Medication Sig Start Date End Date Taking? Authorizing Provider   albuterol sulfate (PROVENTIL OR VENTOLIN OR PROAIR) 90 mcg/actuation Inhalation HFA Aerosol Inhaler Take 2 Puffs by inhalation 07/30/16  Yes Provider, Historical    ASPIRIN/ACETAMINOPHEN/CAFFEINE (EXCEDRIN MIGRAINE ORAL) Take by mouth    Provider, Historical   cephalexin (KEFLEX ORAL) Take by mouth   Yes Provider, Historical   cyclobenzaprine (FLEXERIL) 10 mg Oral Tablet  06/07/16  Yes Provider, Historical   fluticasone (FLONASE) 50 mcg/actuation Nasal Spray, Suspension 1 Spray by Each Nostril route Once a day   Yes Provider, Historical   gabapentin (NEURONTIN) 100 mg Oral Capsule Take 100 mg by mouth Three times a day   Yes Provider, Historical   MULTIVITS,CA,MINERALS/IRON/FA (ONE-A-DAY WOMENS FORMULA ORAL) Take by mouth   Yes Provider, Historical   naproxen sodium (ANAPROX) 550 mg Oral Tablet Take 1 Tab (550 mg total) by mouth Twice daily with food 09/21/16   Idelle CrouchMuyderman, Joshua M, MD   sertraline (ZOLOFT) 50 mg Oral Tablet Take 100 mg by mouth Once a day    Yes Provider, Historical   sulfamethoxazole/trimethoprim (BACTRIM ORAL) Take 800 mg by mouth   Yes Provider, Historical   tiZANidine (ZANAFLEX) 2 mg Oral Tablet  04/27/16  Yes Provider, Historical   ZOLPIDEM TARTRATE (AMBIEN ORAL) Take by mouth   Yes Provider, Historical   mupirocin (BACTROBAN) 2 % Ointment Apply a thin layer to  effected area for 10 days 07/09/16 10/29/16  Ross, Sandra, APRN       Social History:  Social History   Substance Use Topics   . Smoking status: Current Every Day Smoker     Packs/day: 0.25     Start date: 11/20/1998     Last attempt to quit: 10/13/2015   . Smokeless tobacco: Never Used   . Alcohol use No       Family History:  Family Medical History     Problem Relation (Age of Onset)    Cirrhosis Father    Colon Cancer Other    Congestive Heart Failure Mother    Diabetes Paternal Grandmother    Hearing Loss Mother    Heart Attack Mother    Heart Disease Mother    High Cholesterol Mother, Father    Hypertension Mother, Father, Brother              Review of systems:  Constitutional: No weight loss or night sweats   Genitourinary: see HPI above.  ALL OTHERS NEGATIVE, Except as mentioned in HPI     OBJECTIVE:    Vitals:  BP 117/81  Pulse (!) 120  Temp 36.5 C (97.7 F) (Thermal Scan)   Ht 1.6 m (5' 3")  Wt 62 kg (136 lb 9.6 oz)  BMI 24.2 kg/m2    Physical examination:  Constitutional: Appears well, no acute distress.   Eyes: EOMI  ENT: Neck supple, MMM   Cardiovascular: Normal sinus rate, regular rhythm.  No murmur audible.  Respiratory: Clear to auscultation.   GI: Abdomen soft, flat, non-tender. No mass. +BS's.       Integumentary/Skin: No rashes or lesions  Musculoskeletal: Grossly normal  GU: no CVA tenderness.    no bladder tenderness  Psychiatric: Mood and affect normal.     Labs:  Pertinent labs reviewed.    Urine Dip Results:   Glucose: Negative  Bilirubin: Negative  Ketones: Negative  Urine Specific Gravity: 1.010  Blood (urine): Negative  Protein: Negative  Urobilinogen: 0.2mg/dL (Normal)  Nitrite: Negative  Leukocytes: Negative         Imaging studies:  Pertinent imaging studies reviewed.  My interpretation of the CT scan is included in the HPI.    Assessment and Plan:    Nephrolithiasis  Plan for shockwave lithotripsy on the left-sided lower pole stone as long as KUB confirms at the ureterovesical junction stone has passed.  If the UVJ stone is still there, she will require ureteroscopy for removal of that stone before considering shockwave for the upper tract stone.  Patient counseled on risks, benefits and alternatives to ESWL and all questions were answered.  Risks include, but are not limited to: bleeding, infection, damage to urethra, bladder or ureter which may require prolonged ureteral stenting or Foley catheter drainage or a secondary surgery.  Alternatives include continued trial of passage or ureteroscopy.  The patient agrees that ureteroscopy should be performed if the stone cannot be visualized for ESWL.  The patient expressed understanding of this counseling and wishes to proceed.     Orders Placed This Encounter   . XR ABDOMEN, SUPINE (KUB)   . POCT URINE DIPSTICK         This  note was completed using voice recognition technology. Please excuse any errors that have occurred as a result and contact me if any parts of the note are unclear.

## 2016-10-29 NOTE — Nursing Note (Signed)
10/29/16 1200   Urine   Glucose Negative   Bilirubin Negative   Ketones Negative   Urine Specific Gravity 1.010   Blood (urine) Negative   pH 8.0   Protein Negative   Urobilinogen 0.2mg /dL (Normal)   Nitrite Negative   Leukocytes Negative

## 2016-10-30 ENCOUNTER — Other Ambulatory Visit: Payer: MEDICAID | Attending: Family

## 2016-10-30 ENCOUNTER — Encounter (INDEPENDENT_AMBULATORY_CARE_PROVIDER_SITE_OTHER): Payer: Self-pay | Admitting: Family

## 2016-10-30 ENCOUNTER — Ambulatory Visit (INDEPENDENT_AMBULATORY_CARE_PROVIDER_SITE_OTHER): Payer: MEDICAID | Admitting: Family

## 2016-10-30 VITALS — BP 108/72 | HR 94 | Temp 98.9°F | Resp 22 | Ht 63.0 in | Wt 137.6 lb

## 2016-10-30 DIAGNOSIS — J029 Acute pharyngitis, unspecified: Secondary | ICD-10-CM

## 2016-10-30 DIAGNOSIS — B9789 Other viral agents as the cause of diseases classified elsewhere: Secondary | ICD-10-CM

## 2016-10-30 DIAGNOSIS — J069 Acute upper respiratory infection, unspecified: Secondary | ICD-10-CM

## 2016-10-30 LAB — POCT RAPID STREP A: RAPID STREP A (POCT): NEGATIVE

## 2016-10-30 NOTE — Nursing Note (Signed)
10/30/16 1900   Rapid Strep   Rapid Strep Negative   Lot# [id number]   Expiration Date 05/29/18   Internal Control Valid yes   Nurse [facility]s [facility]   Ordering Physician Wyn QuakerOELKEY

## 2016-10-30 NOTE — Nursing Note (Signed)
BP 108/72  Pulse 94  Temp 37.2 C (98.9 F) (Oral)   Resp (!) 22  Ht 1.6 m (5\' 3" )  Wt 62.4 kg (137 lb 9.6 oz)  LMP 10/16/2016  SpO2 99%  BMI 24.37 kg/m2  Jeanelle Mallingngela Andreia Gandolfi, LPN  8/11/91478/31/2018, 19:17

## 2016-10-30 NOTE — Progress Notes (Signed)
[facility]  [address]  [place] [id number]       Name: Lindsay PeaksJessica Mccullough MRN:  [id number]   Date: 10/30/2016 Age: 31 y.o.; 02/09/1986      Chief Complaint(s):   Chief Complaint   Patient presents with   . Sinus Infection       SUBJECTIVE:  Lindsay PeaksJessica Stern is a 31 y.o. female who presents today with cough, runny nose, headache, fever 100.5 last night and this morning,  bilateral ear pain, and sore throat since yesterday. Patient medicated with Motrin with relief of fever. Pt states pain is 4/10.      ROS:   Constitutional: Denies fevers, chills, weight changes, or fatigue  Eyes: Denies change in vision, eye redness or discharge  Ears: Positive for bilateral ear pain   Mouth/Throat: Positive for sore throat and rhinorrhea.  Denies nasal congestion.  Cardiovascular: Denies chest pain or palpitations  Respiratory: Positive for cough. Denies shortness of breath, wheezing.  Musculoskeletal: Denies muscle/joint pain or swelling  Neurological: Positive for headache.  Denies dizziness  Skin:  Denies rashes      Rapid Strep: Negative      Past Medical History  Current Outpatient Prescriptions   Medication Sig   . albuterol sulfate (PROVENTIL OR VENTOLIN OR PROAIR) 90 mcg/actuation Inhalation HFA Aerosol Inhaler Take 2 Puffs by inhalation   . ASPIRIN/ACETAMINOPHEN/CAFFEINE (EXCEDRIN MIGRAINE ORAL) Take by mouth   . fluticasone (FLONASE) 50 mcg/actuation Nasal Spray, Suspension 1 Spray by Each Nostril route Once a day   . gabapentin (NEURONTIN) 100 mg Oral Capsule Take 100 mg by mouth Three times a day   . MULTIVITS,CA,MINERALS/IRON/FA (ONE-A-DAY WOMENS FORMULA ORAL) Take by mouth   . naproxen sodium (ANAPROX) 550 mg Oral Tablet Take 1 Tab (550 mg total) by mouth Twice daily with food   . sertraline (ZOLOFT) 50 mg Oral Tablet Take 100 mg by mouth Once a day    . tiZANidine (ZANAFLEX) 2 mg Oral Tablet    . ZOLPIDEM TARTRATE (AMBIEN ORAL) Take by mouth     Allergies   Allergen Reactions   . Amoxicillin Anaphylaxis   .  Dilaudid [Hydromorphone] Shortness of Breath   . Spironolactone Rash and Hives/ Urticaria   . Iodine      Blisters on left arm   . Penicillins Hives/ Urticaria     Past Medical History:   Diagnosis Date   . Asthma    . Dysplasia of cervix    . H/O seasonal allergies      Past Surgical History:   Procedure Laterality Date   . HX DILATION AND CURETTAGE     . HX NO SURGICAL PROCEDURES       Family Medical History     Problem Relation (Age of Onset)    Cirrhosis Father    Colon Cancer Other    Congestive Heart Failure Mother    Diabetes Paternal Grandmother    Hearing Loss Mother    Heart Attack Mother    Heart Disease Mother    High Cholesterol Mother, Father    Hypertension Mother, Father, Brother        Social History     Social History   . Marital status: Divorced     Spouse name: N/A   . Number of children: N/A   . Years of education: N/A     Social History Main Topics   . Smoking status: Current Every Day Smoker     Packs/day: 0.25  Start date: 11/20/1998     Last attempt to quit: 10/13/2015   . Smokeless tobacco: Never Used   . Alcohol use No   . Drug use: No   . Sexual activity: Not on file     Other Topics Concern   . Breast Self Exam No     Social History Narrative       OBJECTIVE:  BP 108/72  Pulse 94  Temp 37.2 C (98.9 F) (Oral)   Resp (!) 22  Ht 1.6 m (5\' 3" )  Wt 62.4 kg (137 lb 9.6 oz)  LMP 10/16/2016  SpO2 99%  BMI 24.37 kg/m2     Physical Exam:   General: alert and oriented x 3, no apparent acute distress,  pleasant.  Eyes: Conjunctiva are clear, no discharge or icterus noted, PERRLA  HENT: Mucous membranes are moist, nares are clear, posterior oropharynx is with erythema and exudates, bilateral TM and canals are normal   Lymphadenopathy: No cervical lymphadenopathy  Cardiovascular: Normal rate and regular rhythmregular  Lungs: Clear to auscultation bilaterally, good air movement, no wheezes, crackle, or rhonchi,  cough noted during exam    MS: No heat or swelling noted to any joint   Neurologic: Alert and oriented x 3.  normal gait, speech clear  Skin: Warm and dry, no rashes     ASSESSMENT and PLAN:    ICD-10-CM    1. Viral upper respiratory tract infection J06.9     B97.89    2. Sore throat J02.9 POCT RAPID STREP A     1)  Viral upper respiratory tract infection:  Viral illness should run a benign course over 10-14 days.  Use Motrin/Tylenol for pain and or fever.  Can use saline nasal spray PRN daily.    2)  Sore throat:  Rapid strep performed in clinic with a negative result. Throat culture will be sent to lab.  Discussed results with patient.  Use Motrin/Tylenol PRN pain and or fever. Increase fluids daily.  Call or return to clinic prn if these symptoms worsen or fail to improve as anticipated. Pt verbalized understanding and agrees with the plan of care    Jonna Coup, APRN  10/30/2016, 19:30    Supervising Physician: Talmage Nap    Portions of this note may be dictated using voice recognition software.   Variances in spelling and vocabulary are possible and unintentional. Not all errors are caught/corrected. Please notify the Thereasa Parkin if any discrepancies are noted or if the meaning of any statement is not clear.

## 2016-10-30 NOTE — Patient Instructions (Signed)
[facility]  [address]  [place] [address]  Phone: [phone]  Fax: [phone]           Open Daily 8:00am - 8:00pm, except Sundays 12pm-8pm         ~ Closed Thanksgiving and Christmas Day     Attending Caregiver: Larkin Ina, APRN    Today's orders: No orders of the defined types were placed in this encounter.       Prescription(s) E-Rx to:  [facility] [place]    ________________________________________________________________________  Short Term Disability and [facility] does NOT provide assistance with any disability applications.  If you feel your medical condition requires you to be on disability, you will need to follow up with  Your primary care physician or a specialist.  We apologize for any inconvenience.    For Medication Prescribed by [facility]:  As an Urgent Care facility, our clinic does NOT offer prescription refills over the telephone.    If you need more of the medication one of our medical providers prescribed, you will  Either need to be re-evaluated by [place] or see your primary care physician.    ________________________________________________________________________      It is very important that we have a phone number that is the single best way to contact you in the event that we become aware of important clinical information or concerns after your discharge.  If the phone number you provided at registration is NOT this number you should inform staff and registration prior to leaving.      Your treatment and evaluation today was focused on identifying and treating potentially emergent conditions based on your presenting signs, symptoms, and history.  The resulting initial clinical impression and treatment plan is not intended to be definitive or a substitute for a full physical examination and evaluation by your primary care provider.  If your symptoms persist, worsen, or you develop any new or  concerning symptoms, you need to be evaluated.      If you received x-rays during your visit, be aware that the final and formal interpretation of those films by a radiologist may occur after your discharge.  If there is a significant discrepancy identified after your discharge, we will contact you at the telephone number provided at registration.      If you received a pelvic exam, you may have cultures pending for sexually transmitted diseases.  Positive cultures are reported to the [place] Department of Health as required by state law.  You should be contacted if you cultures are positive.  We will not contact you if they are negative.  You did NOT receive a PAP smear (the screening test for cervical).  This specific test for women is best performed by your gynecologist or primary care provider when indicated.      If you are over 40 year old, we cannot discuss your personal health information with a parent, spouse, family member, or anyone else without your express consent.  This does not include those who have legitimate access to your records and information to assist in your care under the provisions of HIPAA ([place] and [place]) law, or those to whom you have previously given express written consent to do so, such a legal guardian or Power of [place].      You may have received medication that may cause you to feel drowsy and/or light headed  for several hours.  You may even experience some amnesia of your stay.  You should avoid operating a motor vehicle or performing any activity requiring complete alertness or coordination until you feel fully awake (approximately 24-48 hours).  Avoid alcoholic beverages.  You may also have a dry mouth for several hours.  This is a normal side effect and will disappear as the effects of the medication wear off.      Instructions discussed with patient upon discharge by clinical staff with all questions answered.  Please call  Urgent Care  [phone] if any further questions.  Go immediately to the emergency department if any concern or worsening symptoms.    Jonna Couparol Severino Paolo, APRN 10/30/2016, 19:30

## 2016-10-30 NOTE — Nursing Note (Signed)
10/30/16 1900   Rapid Strep   Rapid Strep Negative   Throat culture sent Yes   Initials ms   Lot# [id number]   Expiration Date 05/29/18   Internal Control Valid yes

## 2016-10-31 HISTORY — PX: HX LITHOTRIPSY: SHX66

## 2016-10-31 LAB — STREP A MOLECULAR RAPID - BMC/JMC ONLY: STREP A MOLECULAR RAPID: NEGATIVE

## 2016-10-31 NOTE — Progress Notes (Signed)
Strep a molecular rapid test is negative.  Patient is not on antibiotics.  No need to start antibiotics at this time.  Continue symptomatic therapy as previously instructed.    Eveline KetoBrian J Jamy Whyte, MD  10/31/2016, 17:53

## 2016-11-10 ENCOUNTER — Encounter (INDEPENDENT_AMBULATORY_CARE_PROVIDER_SITE_OTHER): Payer: MEDICAID

## 2016-11-10 ENCOUNTER — Ambulatory Visit (INDEPENDENT_AMBULATORY_CARE_PROVIDER_SITE_OTHER): Payer: MEDICAID | Admitting: Student in an Organized Health Care Education/Training Program

## 2016-11-10 ENCOUNTER — Encounter (INDEPENDENT_AMBULATORY_CARE_PROVIDER_SITE_OTHER): Payer: Self-pay | Admitting: Student in an Organized Health Care Education/Training Program

## 2016-11-10 VITALS — BP 116/76 | HR 99 | Temp 98.2°F | Ht 63.0 in | Wt 138.8 lb

## 2016-11-10 DIAGNOSIS — M7502 Adhesive capsulitis of left shoulder: Secondary | ICD-10-CM

## 2016-11-10 DIAGNOSIS — S46912A Strain of unspecified muscle, fascia and tendon at shoulder and upper arm level, left arm, initial encounter: Principal | ICD-10-CM

## 2016-11-10 MED ORDER — CAPSAICIN 0.025 % TOPICAL CREAM: g | Freq: Four times a day (QID) | CUTANEOUS | 0 refills | 0 days | Status: DC | PRN

## 2016-11-10 NOTE — Progress Notes (Signed)
[facility]  [address]  [place] [id number]       Name: Lindsay PeaksJessica Stanard MRN:  [id number]   Date: 11/10/2016 Age: 31 y.o.     Chief Complaint     Shoulder Pain               SUBJECTIVE:   Lindsay Mccullough is a 31 y.o. female who complains of worsening left shoulder/back pain x 2 weeks without known injury.    Patient states she was sick 2 weeks ago a lot of coughing.  She noticed when she was ill that with the coughing she had pain behind her left shoulder blade.  She states that is limited her movement of her left arm she has been favoring her left arm for the last 2 weeks.  She states that today her pain is worsening, it is difficult to take a deep breath and it is increasingly difficult to move her left arm.  Patient states she does not feel that she has had any trauma to the area.  She has been taking ibuprofen 400 mg every 6 hr for last 2 weeks.  Patient also takes Flexeril and gabapentin from her PCP.        Denies trauma, chest pain, palpitations difficulty breathing.      Past Medical History  Current Outpatient Prescriptions   Medication Sig    albuterol sulfate (PROVENTIL OR VENTOLIN OR PROAIR) 90 mcg/actuation Inhalation HFA Aerosol Inhaler Take 2 Puffs by inhalation    ASPIRIN/ACETAMINOPHEN/CAFFEINE (EXCEDRIN MIGRAINE ORAL) Take by mouth    capsaicin (ZOSTRIX) 0.025 % Cream by Apply Topically route Four times a day as needed    cyclobenzaprine (FLEXERIL) 10 mg Oral Tablet     fluticasone (FLONASE) 50 mcg/actuation Nasal Spray, Suspension 1 Spray by Each Nostril route Once a day    gabapentin (NEURONTIN) 100 mg Oral Capsule Take 100 mg by mouth Three times a day    MULTIVITS,CA,MINERALS/IRON/FA (ONE-A-DAY WOMENS FORMULA ORAL) Take by mouth    naproxen sodium (ANAPROX) 550 mg Oral Tablet Take 1 Tab (550 mg total) by mouth Twice daily with food    sertraline (ZOLOFT) 50 mg Oral Tablet Take 100 mg by mouth Once a day     ZOLPIDEM TARTRATE (AMBIEN ORAL) Take by mouth     Allergies   Allergen  Reactions    Amoxicillin Anaphylaxis    Dilaudid [Hydromorphone] Shortness of Breath    Spironolactone Rash and Hives/ Urticaria    Iodine      Blisters on left arm    Penicillins Hives/ Urticaria     Past Medical History:   Diagnosis Date    Asthma     Dysplasia of cervix     H/O seasonal allergies          Past Surgical History:   Procedure Laterality Date    HX DILATION AND CURETTAGE      HX NO SURGICAL PROCEDURES           Family Medical History     Problem Relation (Age of Onset)    Cirrhosis Father    Colon Cancer Other    Congestive Heart Failure Mother    Diabetes Paternal Grandmother    Hearing Loss Mother    Heart Attack Mother    Heart Disease Mother    High Cholesterol Mother, Father    Hypertension Mother, Father, Brother            Social History     Social  History    Marital status: Divorced     Spouse name: N/A    Number of children: N/A    Years of education: N/A     Social History Main Topics    Smoking status: Current Every Day Smoker     Packs/day: 0.25     Start date: 11/20/1998     Last attempt to quit: 10/13/2015    Smokeless tobacco: Never Used    Alcohol use No    Drug use: No    Sexual activity: Not on file     Other Topics Concern    Breast Self Exam No     Social History Narrative       Review of Systems:  Review of Systems   Constitutional: Negative for chills and fever.   Musculoskeletal: Negative for myalgias.        Left shoulder blade pain, difficulty moving left arm   Skin: Negative for itching and rash.   Neurological: Negative for dizziness.         OBJECTIVE:  BP 116/76   Pulse 99   Temp 36.8 C (98.2 F) (Oral)    Ht 1.6 m ( )   Wt 63 kg (138 lb 12.8 oz)   LMP 11/07/2016   SpO2 99%   BMI 24.59 kg/m2  Appearance: non-toxic, alert and oriented times 3, mild distress from shoulder pain  Cardiovascular:    Heart regular rate and rhythm and S1, S2 normal  Pulmonary Lungs: clear to auscultation bilaterally.     MSK:  Tenderness palpation of medial aspect scapula,  patient unable to abduct left arm beyond 90 even with assistance secondary to pain, patient in guarded position with both arms flexed and crossed across chest, patient favoring left arm and not moving it    ASSESSMENT/PLAN  1. Muscle strain, shoulder region, left, initial encounter    2. Adhesive capsulitis of left shoulder        Orders Placed This Encounter    XR SHOULDER LEFT SERIES    capsaicin (ZOSTRIX) 0.025 % Cream      1. Adhesive capsulitis of left shoulder  - x-ray of left shoulder negative for dislocation or fracture, no visible fractures on ribs   -questionable bone fragment between left rights 3 and 4 however not visible on other views   -will send for night hawk read and notify patient of change in read this evening   - advised to request referral to physical therapy from PCP     2. Muscle strain   - capsaicin cream 4 times daily p.r.n.   - may continue use of Flexeril as prescribed by her PCP  -apply heating pad PRN  -would not recommend to prescribe any additional sedating medication   Portions of this note may be dictated using voice recognition software or a dictation service. Variances in spelling and vocabulary are possible and unintentional. Not all errors are caught/corrected. Please notify the Thereasa Parkin if any discrepancies are noted or if the meaning of any statement is not clear.       Eveline Keto, MD

## 2016-11-10 NOTE — Patient Instructions (Addendum)
[facility]  [address]  [place] [id number]  Phone: [phone]  Fax: [phone]           Open Daily 8:00am - 8:00pm, except Sundays 12pm-8pm         ~ Closed Thanksgiving and Christmas Day     Attending Caregiver: Eveline KetoBrian J Matej Sappenfield, MD    Today's orders:   Orders Placed This Encounter   . XR SHOULDER LEFT SERIES   . capsaicin (ZOSTRIX) 0.025 % Cream        Prescription(s) E-Rx to:  [facility] - [place], [place] - [address]    ________________________________________________________________________  Short Term Disability and Family Medical Leave Act  [facility] does NOT provide assistance with any disability applications.  If you feel your medical condition requires you to be on disability, you will need to follow up with  Your primary care physician or a specialist.  We apologize for any inconvenience.    For Medication Prescribed by [facility]:  As an Urgent Care facility, our clinic does NOT offer prescription refills over the telephone.    If you need more of the medication one of our medical providers prescribed, you will  Either need to be re-evaluated by us or see your primary care physician.    ________________________________________________________________________      It is very important that we have a phone number that is the single best way to contact you in the event that we become aware of important clinical information or concerns after your discharge.  If the phone number you provided at registration is NOT this number you should inform staff and registration prior to leaving.      Your treatment and evaluation today was focused on identifying and treating potentially emergent conditions based on your presenting signs, symptoms, and history.  The resulting initial clinical impression and treatment plan is not intended to be definitive or a substitute for a full physical examination and evaluation by your primary care provider.  If your symptoms  persist, worsen, or you develop any new or concerning symptoms, you need to be evaluated.      If you received x-rays during your visit, be aware that the final and formal interpretation of those films by a radiologist may occur after your discharge.  If there is a significant discrepancy identified after your discharge, we will contact you at the telephone number provided at registration.      If you received a pelvic exam, you may have cultures pending for sexually transmitted diseases.  Positive cultures are reported to the [facility] Department of Health as required by state law.  You should be contacted if you cultures are positive.  We will not contact you if they are negative.  You did NOT receive a PAP smear (the screening test for cervical).  This specific test for women is best performed by your gynecologist or primary care provider when indicated.      If you are over 33 year old, we cannot discuss your personal health information with a parent, spouse, family member, or anyone else without your express consent.  This does not include those who have legitimate access to your records and information to assist in your care under the provisions of HIPAA [facility](Health Insurance Portability and Accountability Act) law, or those to whom you have previously given express written consent to do so, such a legal guardian or Power of [place]one WolfAttorney.      You may have  received medication that may cause you to feel drowsy and/or light headed for several hours.  You may even experience some amnesia of your stay.  You should avoid operating a motor vehicle or performing any activity requiring complete alertness or coordination until you feel fully awake (approximately 24-48 hours).  Avoid alcoholic beverages.  You may also have a dry mouth for several hours.  This is a normal side effect and will disappear as the effects of the medication wear off.      Instructions discussed with patient upon discharge by clinical staff with all questions  answered.  Please call [facility] [phone] if any further questions.  Go immediately to the emergency department if any concern or worsening symptoms.    Adhesive capsulitis of left shoulder; request a referral to physical therapy from your PCP.    Eveline Keto, MD 11/10/2016, 17:14

## 2016-11-10 NOTE — Nursing Note (Signed)
BP 116/76  Pulse 99  Temp 36.8 C (98.2 F) (Oral)   Ht 1.6 m (5\' 3" )  Wt 63 kg (138 lb 12.8 oz)  LMP 11/07/2016  SpO2 99%  BMI 24.59 kg/m2  Sharee PimpleJoyce Akito Boomhower, RN  11/10/2016, 16:57

## 2016-11-11 ENCOUNTER — Encounter (INDEPENDENT_AMBULATORY_CARE_PROVIDER_SITE_OTHER): Payer: Self-pay

## 2016-11-11 ENCOUNTER — Encounter (INDEPENDENT_AMBULATORY_CARE_PROVIDER_SITE_OTHER): Payer: Self-pay | Admitting: Student in an Organized Health Care Education/Training Program

## 2016-11-11 NOTE — Progress Notes (Signed)
No fracture or concerning findings on x-ray of left shoulder.  No changes in management.    Eveline KetoBrian J Minh Jasper, MD  11/11/2016, 07:53

## 2016-11-11 NOTE — Nurses Notes (Signed)
Pt seen in urgent care yesterday. Attempted to call for follow up, no answer at this time. Left VM to call back with any questions or concerns.

## 2016-11-13 ENCOUNTER — Encounter (INDEPENDENT_AMBULATORY_CARE_PROVIDER_SITE_OTHER): Payer: Self-pay

## 2016-11-13 NOTE — Nursing Note (Signed)
Pt seen in urgent care today. Called today to follow up, concerning xray results. Results provided.Lindsay Mccullough states that she is still having pain. I informed that she should follow up with her PCP or Ortho. No questions or concerns at this time. Informed pt to call back if there are any questions or concerns.   Marcy Siren, RTR  11/13/2016, 10:26

## 2016-11-16 ENCOUNTER — Ambulatory Visit (INDEPENDENT_AMBULATORY_CARE_PROVIDER_SITE_OTHER): Payer: Medicaid (Managed Care) | Admitting: Family Medicine

## 2016-11-16 DIAGNOSIS — K921 Melena: Secondary | ICD-10-CM

## 2016-11-16 HISTORY — DX: Melena: K92.1

## 2016-11-17 ENCOUNTER — Ambulatory Visit (HOSPITAL_BASED_OUTPATIENT_CLINIC_OR_DEPARTMENT_OTHER)
Admission: RE | Admit: 2016-11-17 | Discharge: 2016-11-17 | Disposition: A | Discharge status: Home / Self Care | Payer: MEDICAID | Source: Ambulatory Visit | Attending: Urology | Admitting: Urology

## 2016-11-17 ENCOUNTER — Encounter (HOSPITAL_BASED_OUTPATIENT_CLINIC_OR_DEPARTMENT_OTHER): Payer: Self-pay

## 2016-11-17 DIAGNOSIS — I499 Cardiac arrhythmia, unspecified: Secondary | ICD-10-CM

## 2016-11-17 DIAGNOSIS — R0602 Shortness of breath: Secondary | ICD-10-CM | POA: Insufficient documentation

## 2016-11-17 DIAGNOSIS — F329 Major depressive disorder, single episode, unspecified: Secondary | ICD-10-CM | POA: Insufficient documentation

## 2016-11-17 DIAGNOSIS — F32A Depression, unspecified: Secondary | ICD-10-CM | POA: Insufficient documentation

## 2016-11-17 DIAGNOSIS — M25512 Pain in left shoulder: Secondary | ICD-10-CM | POA: Insufficient documentation

## 2016-11-17 DIAGNOSIS — R9431 Abnormal electrocardiogram [ECG] [EKG]: Secondary | ICD-10-CM | POA: Insufficient documentation

## 2016-11-17 DIAGNOSIS — F419 Anxiety disorder, unspecified: Secondary | ICD-10-CM

## 2016-11-17 DIAGNOSIS — I4519 Other right bundle-branch block: Secondary | ICD-10-CM

## 2016-11-17 DIAGNOSIS — Z01818 Encounter for other preprocedural examination: Secondary | ICD-10-CM

## 2016-11-17 HISTORY — DX: Anxiety disorder, unspecified: F41.9

## 2016-11-17 HISTORY — DX: Depression, unspecified: F32.A

## 2016-11-17 HISTORY — DX: Calculus of kidney: N20.0

## 2016-11-17 HISTORY — DX: Other chronic pain: G89.29

## 2016-11-17 HISTORY — DX: Panic disorder (episodic paroxysmal anxiety): F41.0

## 2016-11-17 HISTORY — DX: Other specified abnormal findings of blood chemistry: R79.89

## 2016-11-17 HISTORY — DX: Pain in left shoulder: M25.512

## 2016-11-17 HISTORY — DX: Personal history of other diseases of the respiratory system: Z87.09

## 2016-11-17 HISTORY — DX: Other general symptoms and signs: R68.89

## 2016-11-17 HISTORY — DX: Shortness of breath: R06.02

## 2016-11-17 HISTORY — DX: Dorsopathy, unspecified: M53.9

## 2016-11-17 HISTORY — DX: Migraine, unspecified, not intractable, without status migrainosus: G43.909

## 2016-11-17 HISTORY — DX: Nicotine dependence, cigarettes, uncomplicated: F17.210

## 2016-11-17 LAB — CBC
HCT: 40 % (ref 36.0–45.0)
HGB: 13.8 g/dL (ref 12.0–15.5)
MCH: 33.5 pg — ABNORMAL HIGH (ref 27.5–33.2)
MCHC: 34.5 g/dL (ref 32.0–36.0)
MCV: 97 fL (ref 82.0–97.0)
MPV: 7.9 fL (ref 7.4–10.5)
PLATELETS: 380 x10ˆ3/uL (ref 150–450)
RBC: 4.12 x10ˆ6/uL (ref 4.00–5.10)
RDW: 17.2 % — ABNORMAL HIGH (ref 11.0–16.0)
WBC: 6.7 x10ˆ3/uL (ref 4.0–11.0)

## 2016-11-17 LAB — ECG 12-LEAD
Atrial Rate: 92 {beats}/min
Calculated P Axis: 69 degrees
Calculated R Axis: 87 degrees
Calculated T Axis: 50 degrees
Calculated T Axis: 50 degrees
PR Interval: 180 ms
QRS Duration: 104 ms
QT Interval: 366 ms
QTC Calculation: 452 ms
Ventricular rate: 92 {beats}/min

## 2016-11-17 NOTE — Nurses Notes (Signed)
PST-Patient received blue folder and education regarding surgical stay from the physicians office. Questions and concerns addressed.

## 2016-11-17 NOTE — Discharge Instructions (Signed)
Nothing to eat or drink after midnight the day of surgery except a sip of water with Gabapentin, Zoloft;     Please bring your inhaler(s) with you on the day of surgery; May use Inhaler am of OR as instructed

## 2016-11-17 NOTE — Nurses Notes (Signed)
Dr. Margo Aye evaluated EKG, he states EKG ok; Pt. Is to see Dr. Namon Cirri 11/19/16 at 1400; Pt. Instructed if patient gets severe to go to Emergency Room or Urgent to be evaluated; Pt. With verbal understanding;

## 2016-11-17 NOTE — Nurses Notes (Signed)
Patient complaining of left shoulder blade pain, SOB, has been evaluated in Urgent x 2 last week; Patient evaluated by Dr. Lia Hopping request EKG to be done; He instructed patient to see PCP (Dr. Namon Cirri) concerning left shoulder pain. Dr. Margo Aye to evaluated EKG when completed

## 2016-11-18 ENCOUNTER — Emergency Department (HOSPITAL_BASED_OUTPATIENT_CLINIC_OR_DEPARTMENT_OTHER)
Admission: EM | Admit: 2016-11-18 | Discharge: 2016-11-18 | Disposition: A | Discharge status: Home / Self Care | Payer: MEDICAID | Attending: Emergency Medicine | Admitting: Emergency Medicine

## 2016-11-18 ENCOUNTER — Encounter (HOSPITAL_BASED_OUTPATIENT_CLINIC_OR_DEPARTMENT_OTHER): Payer: Self-pay

## 2016-11-18 ENCOUNTER — Emergency Department (HOSPITAL_BASED_OUTPATIENT_CLINIC_OR_DEPARTMENT_OTHER): Payer: MEDICAID

## 2016-11-18 DIAGNOSIS — F17210 Nicotine dependence, cigarettes, uncomplicated: Secondary | ICD-10-CM | POA: Insufficient documentation

## 2016-11-18 DIAGNOSIS — G43909 Migraine, unspecified, not intractable, without status migrainosus: Secondary | ICD-10-CM | POA: Insufficient documentation

## 2016-11-18 DIAGNOSIS — R079 Chest pain, unspecified: Secondary | ICD-10-CM | POA: Insufficient documentation

## 2016-11-18 DIAGNOSIS — J45909 Unspecified asthma, uncomplicated: Secondary | ICD-10-CM | POA: Insufficient documentation

## 2016-11-18 DIAGNOSIS — F329 Major depressive disorder, single episode, unspecified: Secondary | ICD-10-CM | POA: Insufficient documentation

## 2016-11-18 DIAGNOSIS — M25512 Pain in left shoulder: Secondary | ICD-10-CM | POA: Insufficient documentation

## 2016-11-18 DIAGNOSIS — R0602 Shortness of breath: Secondary | ICD-10-CM | POA: Insufficient documentation

## 2016-11-18 DIAGNOSIS — Z8249 Family history of ischemic heart disease and other diseases of the circulatory system: Secondary | ICD-10-CM | POA: Insufficient documentation

## 2016-11-18 DIAGNOSIS — G8929 Other chronic pain: Secondary | ICD-10-CM | POA: Insufficient documentation

## 2016-11-18 DIAGNOSIS — F419 Anxiety disorder, unspecified: Secondary | ICD-10-CM | POA: Insufficient documentation

## 2016-11-18 DIAGNOSIS — Z79899 Other long term (current) drug therapy: Secondary | ICD-10-CM | POA: Insufficient documentation

## 2016-11-18 DIAGNOSIS — R05 Cough: Secondary | ICD-10-CM | POA: Insufficient documentation

## 2016-11-18 LAB — COMPREHENSIVE METABOLIC PROFILE - BMC/JMC ONLY
ALBUMIN/GLOBULIN RATIO: 1.3 (ref 0.8–2.0)
ALBUMIN: 3.9 g/dL (ref 3.5–5.0)
ALKALINE PHOSPHATASE: 92 U/L (ref 38–126)
ALT (SGPT): 20 U/L (ref 14–54)
ANION GAP: 8 mmol/L (ref 3–11)
AST (SGOT): 26 U/L (ref 15–41)
BILIRUBIN TOTAL: 0.3 mg/dL (ref 0.3–1.2)
BUN/CREA RATIO: 8 (ref 6–22)
BUN: 5 mg/dL — ABNORMAL LOW (ref 6–20)
CALCIUM: 9.1 mg/dL (ref 8.6–10.3)
CHLORIDE: 102 mmol/L (ref 101–111)
CO2 TOTAL: 27 mmol/L (ref 22–32)
CREATININE: 0.61 mg/dL (ref 0.44–1.00)
ESTIMATED GFR: >60 mL/min/1.73mˆ2 (ref >60)
GLUCOSE: 121 mg/dL — ABNORMAL HIGH (ref 70–110)
POTASSIUM: 3.8 mmol/L (ref 3.4–5.1)
PROTEIN TOTAL: 6.9 g/dL (ref 6.4–8.3)
SODIUM: 137 mmol/L (ref 136–145)

## 2016-11-18 LAB — HCG, URINE QUALITATIVE, PREGNANCY
HCG URINE QUALITATIVE: UNDETERMINED — AB
SPECIFIC GRAVITY: 1.007 (ref <1.022)

## 2016-11-18 LAB — TROPONIN-I: TROPONIN I: <0.03 ng/mL (ref <=0.06)

## 2016-11-18 LAB — C-REACTIVE PROTEIN(CRP),INFLAMMATION: CRP INFLAMMATION: 4.7 mg/L (ref <=10.0)

## 2016-11-18 MED ORDER — IBUPROFEN 600 MG TABLET
600.00 mg | ORAL_TABLET | ORAL | Status: AC
Start: 2016-11-18 — End: 2016-11-18
  Administered 2016-11-18: 600 mg via ORAL
  Filled 2016-11-18: qty 1

## 2016-11-18 MED ORDER — DIPHENHYDRAMINE 50 MG CAPSULE
ORAL_CAPSULE | ORAL | Status: AC
Start: 2016-11-18 — End: 2016-11-18
  Filled 2016-11-18: qty 1

## 2016-11-18 MED ORDER — DIAZEPAM 5 MG TABLET
10.00 mg | ORAL_TABLET | ORAL | Status: AC
Start: 2016-11-18 — End: 2016-11-18
  Administered 2016-11-18: 10 mg via ORAL
  Filled 2016-11-18: qty 2

## 2016-11-18 MED ORDER — PREDNISONE 10 MG TABLET
50.00 mg | ORAL_TABLET | Freq: Four times a day (QID) | ORAL | Status: DC
Start: 2016-11-18 — End: 2016-11-18
  Administered 2016-11-18 (×2): 50 mg via ORAL
  Filled 2016-11-18: qty 2
  Filled 2016-11-18 (×2): qty 1
  Filled 2016-11-18: qty 2

## 2016-11-18 MED ORDER — DIAZEPAM 5 MG TABLET
5.00 mg | ORAL_TABLET | ORAL | Status: DC
Start: 2016-11-18 — End: 2016-11-18

## 2016-11-18 MED ORDER — IOPAMIDOL 370 MG IODINE/ML (76 %) INTRAVENOUS SOLUTION
100.00 mL | INTRAVENOUS | Status: AC
Start: 2016-11-18 — End: 2016-11-18
  Administered 2016-11-18 (×2): 85 mL via INTRAVENOUS
  Filled 2016-11-18: qty 100

## 2016-11-18 MED ORDER — DIPHENHYDRAMINE 50 MG CAPSULE
50.0000 mg | ORAL_CAPSULE | Freq: Once | ORAL | Status: AC
Start: 2016-11-19 — End: 2016-11-18
  Administered 2016-11-18: 50 mg via ORAL

## 2016-11-18 MED ORDER — LIDOCAINE 5 % TOPICAL PATCH
1.00 | MEDICATED_PATCH | CUTANEOUS | Status: DC
Start: 2016-11-18 — End: 2016-11-18
  Administered 2016-11-18: 700 mg via TRANSDERMAL
  Filled 2016-11-18: qty 1

## 2016-11-18 MED ADMIN — diazePAM 5 mg tablet: ORAL | @ 14:00:00

## 2016-11-18 NOTE — ED Provider Notes (Signed)
[facility] Pall  [facility]  [address]  [place], [place] [address]    Emergency Department Visit Note      Date: 11/18/2016  Primary care provider: Gean Birchwood, MD  Means of arrival: private car  History obtained by: patient  History limited by: none    Chief Complaint: left shoulder pain     History of Present Illness     Lindsay Mccullough, date of birth 04-05-1985, is a 31 y.o. female who presents to the Emergency Department complaining of pain around her posterior left shoulder, that radiates around to her left axilla and anterior ribs, for 4 weeks. Patient states that she had been sick and was coughing when she felt sudden onset of pain, and the pain has been constant ever since. It is described as "like I pulled something" and is currently 10/10, worse with movement. Patient also feels as though she can't catch her breath, for the last 4-5 days. Patient has been to Urgent Care three times for the same complaint and has had xrays which were negative. She has tried motrin, tylenol, flexeril, icyhot, heating pad, and gabapentin, without relief. Denies fevers but felt like she had chills yesterday. Patient reports the cough has resolved.   Denies hemoptysis, leg pain or swelling, abdominal pain, N/V/D, rashes, trauma to the shoulder or chest, palpitations, dizziness or syncope. No history of DVT or PE. Patient does smoke. No recent surgeries.    Review of Systems     The pertinent positive and negative symptoms are as per HPI. All other systems reviewed and are negative.    Patient History      Past Medical History:  Past Medical History:   Diagnosis Date   . Anxiety    . Asthma     controlled with prn Inhaler   . Back problem     chronic lower back pain   . Blood in stool 11/16/2016   . Chronic pain     chronic lower back pain   . Cigarette smoker    . Depression    . Dysplasia of cervix    . Elevated d-dimer     Hx of   . H/O seasonal allergies    . Hx of bronchitis most recent 05/2016   .  Left shoulder pain    . Migraine    . 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 Margo Aye evaluated neck 11/17/16, occ. neck pain   . Nephrolithiasis    . Panic attack    . Shortness of breath     Recenty with SOB and left shoulder blader pain-has to sit up to breathe    . Transfusion history 2013    with childbirth       Past Surgical History:  Past Surgical History:   Procedure Laterality Date   . HX DILATION AND CURETTAGE  2006    sec. to miscarriage   . HX NO SURGICAL PROCEDURES             Family History:  Family Medical History     Problem Relation (Age of Onset)    Cirrhosis Father    Colon Cancer Other    Congestive Heart Failure Mother    Diabetes Paternal Grandmother    Hearing Loss Mother    Heart Attack Mother    Heart Disease Mother    High Cholesterol Mother, Father  Hypertension Mother, Father, Brother              Social History:  Social History   Substance Use Topics   . Smoking status: Current Every Day Smoker     Packs/day: 0.25     Years: 17.00     Types: Cigarettes   . Smokeless tobacco: Never Used   . Alcohol use No     History   Drug Use No       Medications:  Discharge Medication List as of 11/18/2016  1:50 PM      CONTINUE these medications which have NOT CHANGED    Details   albuterol sulfate (PROVENTIL OR VENTOLIN OR PROAIR) 90 mcg/actuation Inhalation HFA Aerosol Inhaler Take 2 Puffs by inhalation Every 6 hours as needed , Historical Med      ASPIRIN/ACETAMINOPHEN/CAFFEINE (EXCEDRIN MIGRAINE ORAL) Take 1 Tab by mouth Every 6 hours as needed , Historical Med      capsaicin (ZOSTRIX) 0.025 % Cream Apply Topically, 4 TIMES DAILY PRN Starting 11/10/2016, Until Discontinued, Disp-45 g, R-0, E-Rx      cyclobenzaprine (FLEXERIL) 10 mg Oral Tablet 10 mg Three times a day , R-0, Historical Med      fluticasone (FLONASE) 50 mcg/actuation Nasal Spray, Suspension 1 Spray by Each Nostril route Once a day, Historical Med      gabapentin  (NEURONTIN) 100 mg Oral Capsule Take 100 mg by mouth Three times a day, Historical Med      MULTIVITS,CA,MINERALS/IRON/FA (ONE-A-DAY WOMENS FORMULA ORAL) Take 1 Tab by mouth Once a day , Historical Med      sertraline (ZOLOFT) 50 mg Oral Tablet Take 100 mg by mouth Once a day , Historical Med      ZOLPIDEM TARTRATE (AMBIEN ORAL) Take 10 mg by mouth Every night as needed , Historical Med             Allergies:   Allergies   Allergen Reactions   . Amoxicillin Anaphylaxis   . Dilaudid [Hydromorphone] Shortness of Breath   . Spironolactone Rash and Hives/ Urticaria   . Iodine      Blisters on left arm   . Penicillins Hives/ Urticaria       Physical Exam     Vital Signs:    Filed Vitals:    11/18/16 1124 11/18/16 1412   BP: (!) 134/93 110/82   Pulse: (!) 117 94   Resp: 20 17   Temp: 36.8 C (98.3 F)    SpO2: 96% 100%       Pulse Ox: 96% on None (Room Air); interpreted by me OZ:[id number]    Constitutional: This is an awake, WDWN female patient who appears uncomfortable. Tearful. No immediate distress.     Neuro/Psychiatric: Patient is interactive and appropriate. Very pleasant.    Neurological: Normal facial symmetry and speech. Oriented to person, place, time and event.  Skin: Warm, dry and normal color. No rash noted.  HEENT:   Head: Normocephalic and atraumatic.   Mouth/Throat: Oropharynx is clear and moist without erythema or exudates.   Eyes: Conjunctiva normal in appearance.   Neck: Trachea midline. Neck supple.   Cardiovascular: RRR, tachycardic, no obvious murmurs, rubs or gallops appreciated. Intact distal pulses.    Pulmonary/Chest: No respiratory distress. BS clear and equal to auscultation bilaterally without wheezes, rales or rhonchi. There is tenderness to light palpation of the entire chest wall on the left side including anterior, axillary and posterior, especially around the shoulder blade. No obvious crepitus,  deformity or instability noted.   Abdominal: Abdomen soft, no tenderness, rebound or guarding. No  obvious masses or organomegaly.    Musculoskeletal: Moves spine and all extremities well. Patient is able to actively rotate, extend, abduct and flex the shoulder to just over 90 degrees without difficulty. Grossly normal sensation upper extremities bilaterally. 5/5 grip strength.     Diagnostics     Labs:    Results for orders placed or performed during the hospital encounter of 11/18/16   COMPREHENSIVE METABOLIC PROFILE - [facility] ONLY   Result Value Ref Range    SODIUM 137 136 - 145 mmol/L    POTASSIUM 3.8 3.4 - 5.1 mmol/L    CHLORIDE 102 101 - 111 mmol/L    CO2 TOTAL 27 22 - 32 mmol/L    ANION GAP 8 3 - 11 mmol/L    BUN 5 (L) 6 - 20 mg/dL    CREATININE 1.61 0.96 - 1.00 mg/dL    BUN/CREA RATIO 8 6 - 22    ESTIMATED GFR >60 >60 mL/min/1.56m^2    ALBUMIN 3.9 3.5 - 5.0 g/dL    CALCIUM 9.1 8.6 - 04.5 mg/dL    GLUCOSE 409 (H) 70 - 110 mg/dL    ALKALINE PHOSPHATASE 92 38 - 126 U/L    ALT (SGPT) 20 14 - 54 U/L    AST (SGOT) 26 15 - 41 U/L    BILIRUBIN TOTAL 0.3 0.3 - 1.2 mg/dL    PROTEIN TOTAL 6.9 6.4 - 8.3 g/dL    ALBUMIN/GLOBULIN RATIO 1.3 0.8 - 2.0   C-Reactive Protein (CRP), Inflammation   Result Value Ref Range    CRP INFLAMMATION 4.7 <=10.0 mg/L   TROPONIN-I   Result Value Ref Range    TROPONIN I <0.03 <=0.06 ng/mL   HCG, URINE QUALITATIVE, PREGNANCY   Result Value Ref Range    HCG URINE QUALITATIVE Indeterminate (A) Negative    SPECIFIC GRAVITY 1.007 <1.022   HCG, SERUM QUALITATIVE, PREGNANCY   Result Value Ref Range    HCG, QUALITATIVE SERUM Negative Negative     Labs reviewed and interpreted by me.    Radiology:    Ordered this visit:  CT ANGIO CHEST FOR PULMONARY EMBOLUS     Results:    IMPRESSION:  1. No pulmonary embolism.    2. The lungs are normal in appearance without acute radiographic findings.     3. No lung nodules.      Interpreted by radiologist and independently reviewed by me.    ED Progress Note/Medical Decision Making   Old records reviewed by me:   ED Visits  Previous ED Visits         Complaint Diagnosis Description Type Department Provider    09/21/16 Blood in urine  ED (Discharged) ER Idelle Crouch, MD    07/24/15 Shortness of Breath  ED to Hosp-Admission (Discharged) (Admit) 49[facility] Debord, Karolee Ohs, DO; Deuell...    08/07/14 Ear Pain Serous otitis media ... ED (Discharged) ER Gentle, Cristal Deer, MD     I have reviewed the patient's recent past medical history. Nurse's notes reviewed.  Orders placed during this encounter:  Orders Placed This Encounter   . CT ANGIO CHEST FOR PULMONARY EMBOLUS   . CANCELED: CBC/DIFF   . COMPREHENSIVE METABOLIC PROFILE - [facility] ONLY   . C-Reactive Protein (CRP), Inflammation   . TROPONIN-I   . CANCELED: CBC WITH DIFF   . HCG, URINE QUALITATIVE, PREGNANCY   . CANCELED: CBC/DIFF   . CANCELED: CBC  WITH DIFF   . HCG, SERUM QUALITATIVE, PREGNANCY   . CANCELED: ECG 12-Lead   . diphenhydrAMINE (BENADRYL) capsule   . diphenhydrAMINE (BENADRYL) 50 mg capsule ---Cabinet Override   . iopamidol (ISOVUE-370) 76% infusion   . ibuprofen (MOTRIN) tablet   . diazePAM (VALIUM) tablet       Pt was inially evaluated by me, possible etiologies for symptoms were discussed and the need for further evaluation with the above labs and CT scan, pt verbalized understanding and was in agreement with the plan at this time. She has an allergy to iodine, but states that she has received IV contrast before after being pre-treated with medications and did just fine.     Lab/radiology results discussed with pt, she verbalized understanding and had no further questions at this time. Discussed options for pain. Patient would like to try valium for muscle relaxation, but is declining injection. I will give one PO tablet now and one "to go" for use later this evening. Patient has a follow-up appointment already scheduled with her PCP tomorrow to discuss a longer term prescription or alternative therapy. Patient verbalizes understanding and agreement.     The patient was advised that if symptoms  are to worsen that she is to return to the ED immediately, she verbalized understanding. Return precautions were discussed, pt verbalized understanding and is currently stable for discharge. All questions and concerns were answered to her satisfaction and pt is in agreement with the plan.     Supervision physician: Dr. Thelma Barge Vitals:    11/18/16 1124 11/18/16 1412   BP: (!) 134/93 110/82   Pulse: (!) 117 94   Resp: 20 17   Temp: 36.8 C (98.3 F)    SpO2: 96% 100%       Clinical Impression      1. Left shoulder and chest pain    Plan/Disposition     Discharged    Prescriptions:  Discharge Medication List as of 11/18/2016  1:50 PM          Follow Up:  Gean Birchwood, MD  [facility]  [address]  [address]  [place] [address]  [phone]    Go in 1 day  Keep your appointment tomorrow!     [facility]  [address]  [place] [address]  [phone]    If symptoms worsen       Condition on Disposition: Stable

## 2016-11-18 NOTE — Nurses Notes (Signed)
Patient to CT.

## 2016-11-18 NOTE — Nurses Notes (Signed)
Discharge instructions reviewed and provided to patient, Provider aware of DC vitals, no new orders

## 2016-11-18 NOTE — ED Triage Notes (Signed)
Pt c/o left shoulder pain x 1 month. Has been seen at urgent care in [place] several times for same. Pt states the pain started after "I was coughing a lot". Pain worse when moving left arm and with deep breath. Pt appears uncomfortable but eupneic in triage.

## 2016-11-19 ENCOUNTER — Ambulatory Visit (INDEPENDENT_AMBULATORY_CARE_PROVIDER_SITE_OTHER): Payer: Medicaid (Managed Care) | Admitting: Family Medicine

## 2016-11-19 ENCOUNTER — Telehealth (INDEPENDENT_AMBULATORY_CARE_PROVIDER_SITE_OTHER): Payer: Self-pay | Admitting: Urology

## 2016-11-19 MED ORDER — SODIUM CHLORIDE 0.9 % INTRAVENOUS SOLUTION
600.0000 mg | Freq: Once | INTRAVENOUS | Status: AC
Start: 2016-11-20 — End: 2016-11-20
  Administered 2016-11-20 (×2): 600 mg via INTRAVENOUS
  Filled 2016-11-19: qty 4

## 2016-11-19 NOTE — Telephone Encounter (Signed)
Patient called back to confirm arrival time.Baxter Kail, LPN  1/61/0960, 14:45

## 2016-11-19 NOTE — Telephone Encounter (Signed)
LM advising patient of 10:30 arrival time for 11/20/16 procedure with Dr Darlin Drop. Call back requested.    Marland KitchenSmith Mince  11/19/2016, 14:27

## 2016-11-20 ENCOUNTER — Other Ambulatory Visit (INDEPENDENT_AMBULATORY_CARE_PROVIDER_SITE_OTHER): Payer: Self-pay | Admitting: Family Medicine

## 2016-11-20 ENCOUNTER — Ambulatory Visit (HOSPITAL_BASED_OUTPATIENT_CLINIC_OR_DEPARTMENT_OTHER): Payer: MEDICAID | Admitting: Anesthesiology

## 2016-11-20 ENCOUNTER — Encounter (HOSPITAL_BASED_OUTPATIENT_CLINIC_OR_DEPARTMENT_OTHER): Payer: Self-pay

## 2016-11-20 ENCOUNTER — Encounter (HOSPITAL_BASED_OUTPATIENT_CLINIC_OR_DEPARTMENT_OTHER)
Admission: RE | Disposition: A | Discharge status: Home / Self Care | Payer: Self-pay | Source: Ambulatory Visit | Attending: Urology

## 2016-11-20 ENCOUNTER — Inpatient Hospital Stay (HOSPITAL_BASED_OUTPATIENT_CLINIC_OR_DEPARTMENT_OTHER)
Admission: RE | Admit: 2016-11-20 | Discharge: 2016-11-20 | Disposition: A | Discharge status: Home / Self Care | Payer: MEDICAID | Source: Ambulatory Visit | Attending: Urology | Admitting: Urology

## 2016-11-20 DIAGNOSIS — Z885 Allergy status to narcotic agent status: Secondary | ICD-10-CM | POA: Insufficient documentation

## 2016-11-20 DIAGNOSIS — F17210 Nicotine dependence, cigarettes, uncomplicated: Secondary | ICD-10-CM | POA: Insufficient documentation

## 2016-11-20 DIAGNOSIS — F329 Major depressive disorder, single episode, unspecified: Secondary | ICD-10-CM | POA: Insufficient documentation

## 2016-11-20 DIAGNOSIS — Z7951 Long term (current) use of inhaled steroids: Secondary | ICD-10-CM | POA: Insufficient documentation

## 2016-11-20 DIAGNOSIS — N200 Calculus of kidney: Principal | ICD-10-CM | POA: Insufficient documentation

## 2016-11-20 DIAGNOSIS — J45909 Unspecified asthma, uncomplicated: Secondary | ICD-10-CM | POA: Insufficient documentation

## 2016-11-20 DIAGNOSIS — F419 Anxiety disorder, unspecified: Secondary | ICD-10-CM | POA: Insufficient documentation

## 2016-11-20 DIAGNOSIS — Z7982 Long term (current) use of aspirin: Secondary | ICD-10-CM | POA: Insufficient documentation

## 2016-11-20 DIAGNOSIS — Z888 Allergy status to other drugs, medicaments and biological substances status: Secondary | ICD-10-CM | POA: Insufficient documentation

## 2016-11-20 DIAGNOSIS — Z880 Allergy status to penicillin: Secondary | ICD-10-CM | POA: Insufficient documentation

## 2016-11-20 DIAGNOSIS — Z791 Long term (current) use of non-steroidal anti-inflammatories (NSAID): Secondary | ICD-10-CM | POA: Insufficient documentation

## 2016-11-20 DIAGNOSIS — Z79899 Other long term (current) drug therapy: Secondary | ICD-10-CM | POA: Insufficient documentation

## 2016-11-20 HISTORY — PX: KIDNEY STONE SURGERY: SHX686

## 2016-11-20 LAB — HCG, URINE QUALITATIVE, PREGNANCY: HCG URINE QUALITATIVE: NEGATIVE

## 2016-11-20 SURGERY — LITHOTRIPSY EXTRACORPORAL SHOCKWAVE
Anesthesia: IV General | Laterality: Left | Wound class: N/A - Imaging Only

## 2016-11-20 MED ORDER — OXYCODONE-ACETAMINOPHEN 5 MG-325 MG TABLET
1.0000 | ORAL_TABLET | ORAL | 0 refills | Status: DC | PRN
Start: 2016-11-20 — End: 2017-01-31

## 2016-11-20 MED ORDER — MIDAZOLAM 1 MG/ML INJECTION SOLUTION
Freq: Once | INTRAMUSCULAR | Status: DC | PRN
Start: 2016-11-20 — End: 2016-11-20
  Administered 2016-11-20: 2 mg via INTRAVENOUS

## 2016-11-20 MED ORDER — OXYCODONE-ACETAMINOPHEN 5 MG-325 MG TABLET
1.0000 | ORAL_TABLET | ORAL | Status: DC | PRN
Start: 2016-11-20 — End: 2016-11-20

## 2016-11-20 MED ORDER — LACTATED RINGERS INTRAVENOUS SOLUTION
INTRAVENOUS | Status: DC
Start: 2016-11-20 — End: 2016-11-20

## 2016-11-20 MED ORDER — PROMETHAZINE 25 MG/ML INJECTION SOLUTION
25.0000 mg | Freq: Four times a day (QID) | INTRAMUSCULAR | Status: DC | PRN
Start: 2016-11-20 — End: 2016-11-20

## 2016-11-20 MED ORDER — LIDOCAINE (PF) 100 MG/5 ML (2 %) INTRAVENOUS SYRINGE
INJECTION | Freq: Once | INTRAVENOUS | Status: DC | PRN
Start: 2016-11-20 — End: 2016-11-20
  Administered 2016-11-20: 60 mg via INTRAVENOUS

## 2016-11-20 MED ORDER — PROPOFOL 10 MG/ML INTRAVENOUS EMULSION
INTRAVENOUS | Status: DC | PRN
Start: 2016-11-20 — End: 2016-11-20
  Administered 2016-11-20: 150 ug/kg/min via INTRAVENOUS
  Administered 2016-11-20: 0 ug/kg/min via INTRAVENOUS
  Administered 2016-11-20: 130 ug/kg/min via INTRAVENOUS

## 2016-11-20 NOTE — Anesthesia Postprocedure Evaluation (Signed)
Anesthesia Post Op Evaluation    Patient: Lindsay Mccullough  Procedure(s):  LITHOTRIPSY EXTRACORPORAL SHOCKWAVE    Last Vitals:Temperature: 36.5 C (97.7 F) (11/20/16 1252)  Heart Rate: 90 (11/20/16 1515)  BP (Non-Invasive): 99/73 (11/20/16 1515)  Respiratory Rate: 14 (11/20/16 1515)  SpO2-1: 94 % (11/20/16 1515)  Pain Score (Numeric, Faces): 0 (11/20/16 1515)  Patient is sufficiently recovered from the effects of anesthesia to participate in the evaluation and has returned to their pre-procedure level.  Patient location during evaluation: bedside   Post-procedure handoff checklist completed    Patient participation: complete - patient participated  Level of consciousness: awake and alert and responsive to verbal stimuli  Pain management: adequate  Airway patency: patent  Anesthetic complications: no  Cardiovascular status: acceptable  Respiratory status: acceptable  Hydration status: acceptable  Patient post-procedure temperature: Pt Normothermic   PONV Status: Absent

## 2016-11-20 NOTE — H&P (View-Only) (Signed)
[facility]  [address]  [place], [place] [address]    New Office Visit    SUBJECTIVE:    Patient ID:   Lindsay Mccullough, 31 y.o., female      Chief Complaint:   Chief Complaint   Patient presents with   . Kidney Stones       History of Present Illness:    31 y.o. female presents for evaluation of nephrolithiasis.  Patient developed severe, sharp, intermittent, left-sided flank pain associated with urinary frequency and urgency last month.  She also had associated hematuria.  After 4-5 days of the symptoms she went emergency department and was diagnosed with a 5 mm left ureterovesical junction stone.  The CT also showed hydronephrosis and a 6-7 mm lower pole stone.  Both stones are easily visible on scout film.  Her pain resolved completely as did her bladder symptoms about 2 weeks ago, but she did not notice passage of any stones or stone fragments.  This is her 1st ever stone episode in her life.    Review of systems is positive for fevers, chills, weight loss, fatigue, sore throat, chest pain, shortness of breath, abdominal pain, joint pain, back pain, seasonal allergies and depression.    Past Medical History:  Past Medical History:   Diagnosis Date   . Asthma    . Dysplasia of cervix    . H/O seasonal allergies            Past Surgical History:  Past Surgical History:   Procedure Laterality Date   . HX DILATION AND CURETTAGE     . HX NO SURGICAL PROCEDURES             Allergies:  Allergies   Allergen Reactions   . Amoxicillin Anaphylaxis   . Dilaudid [Hydromorphone] Shortness of Breath   . Spironolactone Rash and Hives/ Urticaria   . Iodine      Blisters on left arm   . Penicillins Hives/ Urticaria       Home Medications:  Prior to Admission medications    Medication Sig Start Date End Date Taking? Authorizing Provider   albuterol sulfate (PROVENTIL OR VENTOLIN OR PROAIR) 90 mcg/actuation Inhalation HFA Aerosol Inhaler Take 2 Puffs by inhalation 07/30/16  Yes Provider, Historical    ASPIRIN/ACETAMINOPHEN/CAFFEINE (EXCEDRIN MIGRAINE ORAL) Take by mouth    Provider, Historical   cephalexin (KEFLEX ORAL) Take by mouth   Yes Provider, Historical   cyclobenzaprine (FLEXERIL) 10 mg Oral Tablet  06/07/16  Yes Provider, Historical   fluticasone (FLONASE) 50 mcg/actuation Nasal Spray, Suspension 1 Spray by Each Nostril route Once a day   Yes Provider, Historical   gabapentin (NEURONTIN) 100 mg Oral Capsule Take 100 mg by mouth Three times a day   Yes Provider, Historical   MULTIVITS,CA,MINERALS/IRON/FA (ONE-A-DAY WOMENS FORMULA ORAL) Take by mouth   Yes Provider, Historical   naproxen sodium (ANAPROX) 550 mg Oral Tablet Take 1 Tab (550 mg total) by mouth Twice daily with food 09/21/16   Idelle Crouch, MD   sertraline (ZOLOFT) 50 mg Oral Tablet Take 100 mg by mouth Once a day    Yes Provider, Historical   sulfamethoxazole/trimethoprim (BACTRIM ORAL) Take 800 mg by mouth   Yes Provider, Historical   tiZANidine (ZANAFLEX) 2 mg Oral Tablet  04/27/16  Yes Provider, Historical   ZOLPIDEM TARTRATE (AMBIEN ORAL) Take by mouth   Yes Provider, Historical   mupirocin (BACTROBAN) 2 % Ointment Apply a thin layer to  effected area for 10 days 07/09/16 10/29/16  Fleeta Emmer, APRN       Social History:  Social History   Substance Use Topics   . Smoking status: Current Every Day Smoker     Packs/day: 0.25     Start date: 11/20/1998     Last attempt to quit: 10/13/2015   . Smokeless tobacco: Never Used   . Alcohol use No       Family History:  Family Medical History     Problem Relation (Age of Onset)    Cirrhosis Father    Colon Cancer Other    Congestive Heart Failure Mother    Diabetes Paternal Grandmother    Hearing Loss Mother    Heart Attack Mother    Heart Disease Mother    High Cholesterol Mother, Father    Hypertension Mother, Father, Brother              Review of systems:  Constitutional: No weight loss or night sweats   Genitourinary: see HPI above.  ALL OTHERS NEGATIVE, Except as mentioned in HPI     OBJECTIVE:    Vitals:  BP 117/81  Pulse (!) 120  Temp 36.5 C (97.7 F) (Thermal Scan)   Ht 1.6 m ( )  Wt 62 kg (136 lb 9.6 oz)  BMI 24.2 kg/m2    Physical examination:  Constitutional: Appears well, no acute distress.   Eyes: EOMI  ENT: Neck supple, MMM   Cardiovascular: Normal sinus rate, regular rhythm.  No murmur audible.  Respiratory: Clear to auscultation.   GI: Abdomen soft, flat, non-tender. No mass. +BS's.       Integumentary/Skin: No rashes or lesions  Musculoskeletal: Grossly normal  GU: no CVA tenderness.    no bladder tenderness  Psychiatric: Mood and affect normal.     Labs:  Pertinent labs reviewed.    Urine Dip Results:   Glucose: Negative  Bilirubin: Negative  Ketones: Negative  Urine Specific Gravity: 1.010  Blood (urine): Negative  Protein: Negative  Urobilinogen: 0.2mg /dL (Normal)  Nitrite: Negative  Leukocytes: Negative         Imaging studies:  Pertinent imaging studies reviewed.  My interpretation of the CT scan is included in the HPI.    Assessment and Plan:    Nephrolithiasis  Plan for shockwave lithotripsy on the left-sided lower pole stone as long as KUB confirms at the ureterovesical junction stone has passed.  If the UVJ stone is still there, she will require ureteroscopy for removal of that stone before considering shockwave for the upper tract stone.  Patient counseled on risks, benefits and alternatives to ESWL and all questions were answered.  Risks include, but are not limited to: bleeding, infection, damage to urethra, bladder or ureter which may require prolonged ureteral stenting or Foley catheter drainage or a secondary surgery.  Alternatives include continued trial of passage or ureteroscopy.  The patient agrees that ureteroscopy should be performed if the stone cannot be visualized for ESWL.  The patient expressed understanding of this counseling and wishes to proceed.     Orders Placed This Encounter   . XR ABDOMEN, SUPINE (KUB)   . POCT URINE DIPSTICK         This  note was completed using voice recognition technology. Please excuse any errors that have occurred as a result and contact me if any parts of the note are unclear.

## 2016-11-20 NOTE — Anesthesia Preprocedure Evaluation (Signed)
ANESTHESIA PRE-OP EVALUATION  Planned Procedure: LITHOTRIPSY EXTRACORPORAL SHOCKWAVE (Left )  Review of Systems     anesthesia history negative     patient summary reviewed          Pulmonary   smoked in last 24hours,   Cardiovascular    ECG reviewed  Exercise Tolerance: <4 METS        GI/Hepatic/Renal    no GERD and no motion sickness     Endo/Other    Prednisone yesterday for left scapula and chronic steroid use,       Neuro/Psych/MS       Cancer                 Physical Assessment      Patient summary reviewed   Airway       Mallampati: I      Neck ROM: full              Dental           (+) edentulous           Pulmonary    Breath sounds clear to auscultation       Cardiovascular    Rhythm: regular         Other findings  NPO pMN  Contact lenses no  Piercings   no  Has anything changed since you spoke with the preanesthesia nurse? no  Any changes in the last 6 months that have not been documented or reported? no          Plan  Planned anesthesia type: IV general    ASA 2     Intravenous induction     Anesthetic plan and risks discussed with patient.             Patient's NPO status is appropriate for Anesthesia.           Plan discussed with CRNA.

## 2016-11-20 NOTE — Anesthesia Transfer of Care (Signed)
ANESTHESIA TRANSFER OF CARE   Lindsay Mccullough is a 31 y.o. ,female, Weight: 61.7 kg (136 lb)   had Procedure(s):  LITHOTRIPSY EXTRACORPORAL SHOCKWAVE  performed  11/20/16   Primary Service: Wandra Feinstein, MD    Past Medical History:   Diagnosis Date   . Anxiety    . Asthma     controlled with prn Inhaler   . Back problem     chronic lower back pain   . Blood in stool 11/16/2016   . Chronic pain     chronic lower back pain   . Cigarette smoker    . Depression    . Dysplasia of cervix    . Elevated d-dimer     Hx of   . H/O seasonal allergies    . Hx of bronchitis most recent 05/2016   . Left shoulder pain    . Migraine    . 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 Margo Aye evaluated neck 11/17/16, occ. neck pain   . Nephrolithiasis    . Panic attack    . Shortness of breath     Recenty with SOB and left shoulder blader pain-has to sit up to breathe    . Transfusion history 2013    with childbirth      Allergy History as of 11/20/16     PENICILLINS       Noted Status Severity Type Reaction    10/28/13 1526 Maximiano Coss, [place] 10/28/13 Active Low  Hives/ Urticaria          AMOXICILLIN       Noted Status Severity Type Reaction    10/28/13 1526 Maximiano Coss, [place] 10/28/13 Active High  Anaphylaxis          IODINE       Noted Status Severity Type Reaction    10/05/14 0841 Daleen Bo 10/05/14 Active       Comments:  Blisters on left arm           SPIRONOLACTONE       Noted Status Severity Type Reaction    09/16/15 1057 McVay, Joni Reining 09/16/15 Active Medium  Rash, Hives/ Urticaria          HYDROMORPHONE       Noted Status Severity Type Reaction    10/29/16 1251 Kristen Cardinal, [facility] 10/29/16 Active High  Shortness of Breath              I completed my transfer of care / handoff to the receiving personnel during which we discussed:  Access, Airway, All key/critical aspects of case discussed, Analgesia, Antibiotics, Expectation of post procedure,  Fluids/Product, Gave opportunity for questions and acknowledgement of understanding, Labs and PMHx    Post Location: Phase II                                                                Last OR Temp: Temperature: 36.5 C (97.7 F)  ABG:  POTASSIUM   Date Value Ref Range Status   11/18/2016 3.8 3.4 - 5.1 mmol/L Final     KETONES   Date Value Ref Range Status   09/21/2016 Negative Negative mg/dL Final     CALCIUM  Date Value Ref Range Status   11/18/2016 9.1 8.6 - 10.3 mg/dL Final     CALCIUM OXALATE CRYSTALS   Date Value Ref Range Status   09/21/2016 Slight (A) None /hpf Final     Calculated P Axis   Date Value Ref Range Status   11/17/2016 69 degrees Final     Calculated R Axis   Date Value Ref Range Status   11/17/2016 87 degrees Final     Calculated T Axis   Date Value Ref Range Status   11/17/2016 50 degrees Final     Airway:   Blood pressure 102/79, pulse 90, temperature 36.5 C (97.7 F), resp. rate 14, height 1.6 m ( ), weight 61.7 kg (136 lb), last menstrual period 11/07/2016, SpO2 95 %, not currently breastfeeding.

## 2016-11-20 NOTE — Nurses Notes (Signed)
Patient has met all discharge criteria and is waiting for her transportation to arrive.

## 2016-11-20 NOTE — Interval H&P Note (Signed)
[facility]      H&P UPDATE FORM                                                                                  [name], 31 y.o. female  Date of Admission:  11/20/2016  Date of Birth:  1985-05-28    11/20/2016    STOP: IF H&P IS GREATER THAN 30 DAYS FROM SURGICAL DAY COMPLETE NEW H&P IS REQUIRED.     H & P updated the day of the procedure.  1.  H&P completed within 30 days of surgical procedure  and has been reviewed within 24 hours of the surgery, the patient has been examined, and no change has occured in the patients condition since the H&P was completed.       Change in medications: No     Patient's last menstrual period was 11/07/2016.      Comments:     2.  Patient continues to be appropriate candidate for planned surgical procedure. YES    Wandra Feinstein, MD

## 2016-11-20 NOTE — Nurses Notes (Signed)
Pt states she is sexually active and uses no form of birth control at all, so there could be a possibility of pregnancy. Spoke with Dr. Vear Clock (anesthesia), ordered stat HCG urine pre-op.

## 2016-11-20 NOTE — Nurses Notes (Signed)
Patient has no s/s of distress noted at time of discharge. Pt ambulated without assistance, gait steady.

## 2016-11-20 NOTE — Nurses Notes (Signed)
RN can lock up belongings while in surgery, patient understands any missing belongings are patient's responsibility. Patient instructed to please send an valuable items with family/visitor to ensure no lost or stolen items.

## 2016-11-20 NOTE — Discharge Instructions (Signed)
[facility]  [place], [place] [address]     Anesthesia Discharge Instructions    1.  Do NOT drive an automobile today.    2.  Do NOT sign any legal documents for 24 hours.    3.  Do NOT operate any electrical equipment today.    Fall risk prevention education has been reviewed with the patient/significant other.

## 2016-11-20 NOTE — Care Plan (Signed)
Problem: Patient Care Overview (Adult,OB)  Goal: Plan of Care Review(Adult,OB)  The patient and/or their representative will communicate an understanding of their plan of care   Outcome: Ongoing (see interventions/notes)

## 2016-11-20 NOTE — OR Surgeon (Signed)
OPERATIVE NOTE        PATIENT NAME:  Lindsay Mccullough  MRN:  [id number]  DOB:  Oct 10, 1985  DATE OF SERVICE:  11/20/2016      PREOPERATIVE DIAGNOSES:  nephrolithiaisis    POSTOPERATIVE DIAGNOSES:    nephrolithiaisis    NAME OF PROCEDURES:  Procedure(s):  LEFT LITHOTRIPSY EXTRACORPORAL SHOCKWAVE    SURGEONS:  Dr Wandra Feinstein    ANESTHESIA:  General.    OPERATIVE FINDINGS:  8 mm left lower pole stone    SPECIMENS: None    ESTIMATED BLOOD LOSS:  Less than 5 mL.    COMPLICATIONS:  None    CONDITION:  Stable    DISPOSITION:   PACU - hemodynamically stable.    INDICATIONS: [name] is a 31 y.o. female found to have a 8 mm stone in the lower pole of the left kidney.  After discussion of options, they elected to undergo shock wave lithotripsy.    DESCRIPTION OF PROCEDURE::  After obtaining proper consent, the patient was brought to the operating room and anesthesia was induced.  They were positioned with the expected location of the stone overlying the treatment head.  The stone was then localized in three planes.  After coupling, the stone was treated with 2,500 shocks and excellent fragmentation was observed fluoroscopically.  The patient tolerated the procedure well, and was transferred to the recovery room in good condition after the treatment.

## 2016-11-30 ENCOUNTER — Ambulatory Visit (INDEPENDENT_AMBULATORY_CARE_PROVIDER_SITE_OTHER): Payer: Medicaid (Managed Care) | Admitting: Family Medicine

## 2016-12-03 ENCOUNTER — Encounter (INDEPENDENT_AMBULATORY_CARE_PROVIDER_SITE_OTHER): Payer: Self-pay | Admitting: Family Medicine

## 2016-12-03 ENCOUNTER — Ambulatory Visit (INDEPENDENT_AMBULATORY_CARE_PROVIDER_SITE_OTHER): Payer: Medicaid (Managed Care) | Admitting: Family Medicine

## 2016-12-03 VITALS — BP 112/70 | HR 111 | Temp 98.4°F | Resp 18 | Ht 63.0 in | Wt 140.8 lb

## 2016-12-03 DIAGNOSIS — Z09 Encounter for follow-up examination after completed treatment for conditions other than malignant neoplasm: Secondary | ICD-10-CM

## 2016-12-03 DIAGNOSIS — Z23 Encounter for immunization: Secondary | ICD-10-CM

## 2016-12-03 DIAGNOSIS — G43809 Other migraine, not intractable, without status migrainosus: Secondary | ICD-10-CM | POA: Insufficient documentation

## 2016-12-03 DIAGNOSIS — G43909 Migraine, unspecified, not intractable, without status migrainosus: Secondary | ICD-10-CM

## 2016-12-03 MED ORDER — PROPRANOLOL HCL 60 MG PO TABS
60.0000 mg | ORAL_TABLET | Freq: Every evening | ORAL | 3 refills | Status: DC
Start: 2016-12-03 — End: 2017-02-05

## 2016-12-03 MED ORDER — PROMETHAZINE HCL 25 MG PO TABS
25.0000 mg | ORAL_TABLET | Freq: Three times a day (TID) | ORAL | 1 refills | Status: DC | PRN
Start: 2016-12-03 — End: 2017-02-05

## 2016-12-03 MED ORDER — SUMATRIPTAN SUCCINATE 50 MG PO TABS
50.0000 mg | ORAL_TABLET | ORAL | 0 refills | Status: DC | PRN
Start: 2016-12-03 — End: 2017-02-05

## 2016-12-03 NOTE — Progress Notes (Signed)
Subjective:    Patient ID: Regina Adams is a 31 y.o. female.    Complains of migraine headache for 1 week(s). She has a well established history of recurrent migraines. She wakes up with HA. 8/10 pain scale.     Description of pain: throbbing pain, sharp pain.  Associated symptoms: facial pain, flashing lights, light sensitivity, nausea and vomiting. temporal  Patient has already taken  Excedrine, tylenol, ibuprofen, benadryl for this headache without relief.      Past Medical History:   Diagnosis Date   . Anemia    . Anxiety    . Asthma without status asthmaticus    . Depression    . Hormone disorder    . 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 KIDNEY STONE SURGERY  11/20/2016    SOUND WAVE BLAST   . NO PAST SURGERIES       Family History   Problem Relation Age of Onset   . Hyperlipidemia Mother    . Hypertension Mother    . Heart attack Mother    . Arthritis Mother    . Cancer Father    . Hypertension Father    . Hyperlipidemia Father    . Diabetes Father    . Hypertension Maternal Uncle    . Diabetes Maternal Grandmother      Social History     Social History   . Marital status: Divorced     Spouse name: N/A   . Number of children: N/A   . Years of education: N/A     Occupational History   . Not on file.     Social History Main Topics   . Smoking status: Current Every Day Smoker     Types: Cigarettes     Last attempt to quit: 11/18/2015   . Smokeless tobacco: Former Neurosurgeon     Quit date: 09/19/2013      Comment: 2 per day   . Alcohol use No   . Drug use: No   . Sexual activity: No     Other Topics Concern   . Not on file     Social History Narrative   . No narrative on file     The following portions of the patient's history were reviewed and updated as appropriate: allergies, current medications, past family history, past medical history, past social history, past surgical history and problem list.    Review of Systems   Constitutional: Positive  for fatigue. Negative for fever and unexpected weight change.   HENT: Negative for congestion.    Respiratory: Negative for cough, shortness of breath and wheezing.    Cardiovascular: Negative for chest pain and palpitations.   Neurological: Positive for headaches. Negative for syncope, weakness and numbness.   Hematological: Negative for adenopathy.           Objective:    Physical Exam   Constitutional: She is oriented to person, place, and time. She appears well-developed and well-nourished. No distress.   HENT:   Head: Normocephalic and atraumatic.   Right Ear: External ear normal.   Left Ear: External ear normal.   Mouth/Throat: Oropharynx is clear and moist. No oropharyngeal exudate.   Eyes: Conjunctivae and EOM are normal.   Neck: Neck supple.   Cardiovascular: Normal heart sounds.    Pulmonary/Chest: Breath sounds normal.   Lymphadenopathy:     She has  no cervical adenopathy.   Neurological: She is alert and oriented to person, place, and time. No cranial nerve deficit or sensory deficit.   Psychiatric: She has a normal mood and affect.   Nursing note and vitals reviewed.          Assessment:       1. Other migraine     2. Need for immunization follow-up          Plan:       1. Other migraine - start on Imitrex and propranolol. Promethazine for nausea   2. Need for immunization follow-up - flu shot given today.

## 2016-12-03 NOTE — Progress Notes (Signed)
Patient received flu vaccine injection at time of office visit. No issue with injection.

## 2016-12-17 ENCOUNTER — Encounter (INDEPENDENT_AMBULATORY_CARE_PROVIDER_SITE_OTHER): Payer: Self-pay | Admitting: Family Medicine

## 2016-12-17 ENCOUNTER — Ambulatory Visit (INDEPENDENT_AMBULATORY_CARE_PROVIDER_SITE_OTHER): Payer: Medicaid (Managed Care) | Admitting: Family Medicine

## 2016-12-17 VITALS — BP 104/80 | HR 99 | Temp 98.3°F | Resp 18 | Ht 63.0 in | Wt 140.0 lb

## 2016-12-17 DIAGNOSIS — G43809 Other migraine, not intractable, without status migrainosus: Secondary | ICD-10-CM

## 2016-12-17 MED ORDER — AMITRIPTYLINE HCL 50 MG PO TABS
150.0000 mg | ORAL_TABLET | Freq: Every evening | ORAL | 2 refills | Status: DC
Start: 2016-12-17 — End: 2017-02-05

## 2016-12-17 NOTE — Progress Notes (Signed)
Subjective:    Patient ID: Regina Adams is a 31 y.o. female.    She is still having the headache Imitrex, propranolol and Promethazine not helping. Admits she is under a lot of stress at home cleaning her house taking care of her daughter and helping her 1st grade homework, attending PTA meetings. She states she cannot handle the stress of everyday life. She has appointment with Psychiatry coming up. admits  What works for headache best what she noticed was when she took Percocet for her shoulder it helped with her headache as well.       Past Medical History:   Diagnosis Date   . Anemia    . Anxiety    . Asthma without status asthmaticus    . Depression    . Hormone disorder    . 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 KIDNEY STONE SURGERY  11/20/2016    SOUND WAVE BLAST   . NO PAST SURGERIES       Family History   Problem Relation Age of Onset   . Hyperlipidemia Mother    . Hypertension Mother    . Heart attack Mother    . Arthritis Mother    . Cancer Father    . Hypertension Father    . Hyperlipidemia Father    . Diabetes Father    . Hypertension Maternal Uncle    . Diabetes Maternal Grandmother      Social History     Social History   . Marital status: Divorced     Spouse name: N/A   . Number of children: N/A   . Years of education: N/A     Occupational History   . Not on file.     Social History Main Topics   . Smoking status: Current Every Day Smoker     Types: Cigarettes   . Smokeless tobacco: Former Neurosurgeon     Quit date: 09/19/2013      Comment: 2 per day   . Alcohol use No   . Drug use: No   . Sexual activity: No     Other Topics Concern   . Not on file     Social History Narrative   . No narrative on file     The following portions of the patient's history were reviewed and updated as appropriate: allergies, current medications, past family history, past medical history, past social history, past surgical history and problem list.    Review  of Systems   Constitutional: Positive for fatigue. Negative for fever and unexpected weight change.   HENT: Negative for congestion.    Respiratory: Negative for cough, shortness of breath and wheezing.    Cardiovascular: Negative for chest pain and palpitations.   Neurological: Positive for headaches. Negative for syncope, weakness and numbness.   Hematological: Negative for adenopathy.           Objective:    Physical Exam   Constitutional: She is oriented to person, place, and time. She appears well-developed and well-nourished. No distress.   HENT:   Head: Normocephalic and atraumatic.   Right Ear: External ear normal.   Left Ear: External ear normal.   Mouth/Throat: Oropharynx is clear and moist. No oropharyngeal exudate.   Eyes: Conjunctivae and EOM are normal.   Neck: Neck supple.   Cardiovascular: Normal heart sounds.    Pulmonary/Chest: Breath sounds  normal.   Lymphadenopathy:     She has no cervical adenopathy.   Neurological: She is alert and oriented to person, place, and time. No cranial nerve deficit or sensory deficit.   Psychiatric: She has a normal mood and affect.   Nursing note and vitals reviewed.          Assessment:       1. Other migraine without status migrainosus, not intractable          Plan:       1. Other migraine - amitryptiline 50 mg nightly continue with propranolol and imitrex. It will be helpful if she lessen her stress as well. Her headache is also triggered by this.  Stress relieving activities and do not overdo it at home. She states she has a lot to do at home and in her children's school that too much responsibilities that she needs to back up as it becomes too overwhelming for her. Will give this a try first then if persistent and medications failed will refer to neurology for further evaluation.

## 2016-12-21 ENCOUNTER — Encounter (INDEPENDENT_AMBULATORY_CARE_PROVIDER_SITE_OTHER): Payer: Self-pay | Admitting: Urology

## 2016-12-24 ENCOUNTER — Ambulatory Visit (INDEPENDENT_AMBULATORY_CARE_PROVIDER_SITE_OTHER): Payer: MEDICAID | Admitting: Urology

## 2016-12-24 ENCOUNTER — Ambulatory Visit (HOSPITAL_BASED_OUTPATIENT_CLINIC_OR_DEPARTMENT_OTHER)
Admission: RE | Admit: 2016-12-24 | Discharge: 2016-12-24 | Disposition: A | Discharge status: Home / Self Care | Payer: MEDICAID | Source: Ambulatory Visit | Attending: Urology | Admitting: Urology

## 2016-12-24 ENCOUNTER — Encounter (INDEPENDENT_AMBULATORY_CARE_PROVIDER_SITE_OTHER): Payer: Self-pay | Admitting: Urology

## 2016-12-24 VITALS — BP 112/79 | HR 98 | Temp 96.9°F | Ht 63.0 in | Wt 141.8 lb

## 2016-12-24 DIAGNOSIS — N200 Calculus of kidney: Principal | ICD-10-CM

## 2016-12-24 NOTE — Patient Instructions (Signed)
Patient was counseled by me to stay well hydrated with a goal of 2.5 liters of urine output per day, which will typically require about 3 liters of hydration.  Any fluid counts as hydration except for colas.  Colas cause stones.  Reduction in meat products, to include chicken and fish, is also recommended to reduce protein intake that has been implicated in stone formation.  The recommended intake is one 8 oz serving of meat per day, which is about the size of a deck of cards.  Finally, sodium (salt) intake should be limited to 2 grams (2,000mg) or less per day to prevent formation of kidney stones. Additionally, I discussed the potential benefits of citrate (citric acid), as found in lemonade or lemon flavored drinks, for stone inhibition.  Is also important to maintain balance between calcium and oxalate intake to avoid excessive absorption one or the other.

## 2016-12-24 NOTE — Progress Notes (Signed)
Urology Progress note      Chief Complaint   Patient presents with   . Surgical Follow-up     ESWL        Lindsay Mccullough is a 31 y.o. female presents for follow-up after shock wave lithotripsy for a left-sided lower pole stone.  She did not collect any of the fragments that she passed and is completely pain-free.  Follow-up KUB shows no residual stones.    BP 112/79  Pulse 98  Temp 36.1 C (96.9 F) (Thermal Scan)   Ht 1.6 m (5\' 3" )  Wt 64.3 kg (141 lb 12.8 oz)  BMI 25.12 kg/m2     PE:   Appears well, no acute distress.    Nephrolithiasis:  Patient was counseled by me to stay well hydrated with a goal of 2.5 liters of urine output per day, which will typically require about 3 liters of hydration.  Any fluid counts as hydration except for colas.  Colas cause stones.  Reduction in meat products, to include chicken and fish, is also recommended to reduce protein intake that has been implicated in stone formation.  The recommended intake is one 8 oz serving of meat per day, which is about the size of a deck of cards.  Finally, sodium (salt) intake should be limited to 2 grams (2,000mg ) or less per day to prevent formation of kidney stones. Additionally, I discussed the potential benefits of citrate (citric acid), as found in lemonade or lemon flavored drinks, for stone inhibition.  Is also important to maintain balance between calcium and oxalate intake to avoid excessive absorption one or the other.    >50% of this 25 minute visit was spent counseling and coordinating care      Wandra FeinsteinEric Matilde Markie, MD

## 2017-01-31 ENCOUNTER — Emergency Department (HOSPITAL_BASED_OUTPATIENT_CLINIC_OR_DEPARTMENT_OTHER)
Admission: EM | Admit: 2017-01-31 | Discharge: 2017-01-31 | Disposition: A | Discharge status: Home / Self Care | Payer: Medicaid Other | Source: Ambulatory Visit | Attending: Emergency Medicine | Admitting: Emergency Medicine

## 2017-01-31 ENCOUNTER — Encounter (HOSPITAL_BASED_OUTPATIENT_CLINIC_OR_DEPARTMENT_OTHER): Payer: Self-pay

## 2017-01-31 ENCOUNTER — Encounter (INDEPENDENT_AMBULATORY_CARE_PROVIDER_SITE_OTHER): Payer: Self-pay | Admitting: Family

## 2017-01-31 ENCOUNTER — Ambulatory Visit (INDEPENDENT_AMBULATORY_CARE_PROVIDER_SITE_OTHER): Payer: Medicaid Other | Admitting: Family

## 2017-01-31 ENCOUNTER — Emergency Department (HOSPITAL_BASED_OUTPATIENT_CLINIC_OR_DEPARTMENT_OTHER)
Admission: RE | Admit: 2017-01-31 | Discharge: 2017-01-31 | Disposition: A | Discharge status: Home / Self Care | Payer: Medicaid Other | Source: Ambulatory Visit

## 2017-01-31 VITALS — BP 118/81 | HR 119 | Temp 98.8°F | Ht 63.0 in | Wt 141.0 lb

## 2017-01-31 DIAGNOSIS — Z79899 Other long term (current) drug therapy: Secondary | ICD-10-CM | POA: Insufficient documentation

## 2017-01-31 DIAGNOSIS — Z6824 Body mass index (BMI) 24.0-24.9, adult: Secondary | ICD-10-CM

## 2017-01-31 DIAGNOSIS — R296 Repeated falls: Secondary | ICD-10-CM

## 2017-01-31 DIAGNOSIS — Z7951 Long term (current) use of inhaled steroids: Secondary | ICD-10-CM | POA: Insufficient documentation

## 2017-01-31 DIAGNOSIS — R51 Headache: Secondary | ICD-10-CM

## 2017-01-31 DIAGNOSIS — R42 Dizziness and giddiness: Secondary | ICD-10-CM | POA: Insufficient documentation

## 2017-01-31 DIAGNOSIS — B349 Viral infection, unspecified: Secondary | ICD-10-CM | POA: Insufficient documentation

## 2017-01-31 DIAGNOSIS — F17210 Nicotine dependence, cigarettes, uncomplicated: Secondary | ICD-10-CM | POA: Insufficient documentation

## 2017-01-31 DIAGNOSIS — J45909 Unspecified asthma, uncomplicated: Secondary | ICD-10-CM | POA: Insufficient documentation

## 2017-01-31 DIAGNOSIS — R079 Chest pain, unspecified: Secondary | ICD-10-CM | POA: Insufficient documentation

## 2017-01-31 DIAGNOSIS — R519 Headache, unspecified: Secondary | ICD-10-CM

## 2017-01-31 LAB — CBC WITH DIFF
BASOPHIL #: 0 x10ˆ3/uL (ref 0.00–0.10)
BASOPHIL %: 0 % (ref 0–3)
EOSINOPHIL #: 0.1 10*3/uL (ref 0.00–0.50)
EOSINOPHIL %: 1 % (ref 0–5)
EOSINOPHIL %: 1 % (ref 0–5)
HCT: 44.2 % (ref 36.0–45.0)
HGB: 14.7 g/dL (ref 12.0–15.5)
LYMPHOCYTE #: 1.9 10*3/uL (ref 1.00–4.80)
LYMPHOCYTE %: 28 % (ref 15–43)
MCH: 32.7 pg (ref 27.5–33.2)
MCH: 32.7 pg (ref 27.5–33.2)
MCHC: 33.2 g/dL (ref 32.0–36.0)
MCHC: 33.2 g/dL (ref 32.0–36.0)
MCV: 98.5 fL — ABNORMAL HIGH (ref 82.0–97.0)
MONOCYTE #: 0.5 x10ˆ3/uL (ref 0.20–0.90)
MONOCYTE %: 8 % (ref 5–12)
MPV: 7.1 fL — ABNORMAL LOW (ref 7.4–10.5)
NEUTROPHIL #: 4.5 10*3/uL (ref 1.50–6.50)
NEUTROPHIL %: 64 % (ref 43–76)
PLATELETS: 314 10*3/uL (ref 150–450)
RBC: 4.48 x10ˆ6/uL (ref 4.00–5.10)
RDW: 13 % (ref 11.0–16.0)
WBC: 7 x10ˆ3/uL (ref 4.0–11.0)

## 2017-01-31 LAB — COMPREHENSIVE METABOLIC PROFILE - BMC/JMC ONLY
ALBUMIN/GLOBULIN RATIO: 1.2 (ref 0.8–2.0)
ALBUMIN: 4.1 g/dL (ref 3.5–5.0)
ALKALINE PHOSPHATASE: 96 U/L (ref 38–126)
ALT (SGPT): 17 U/L (ref 14–54)
ANION GAP: 8 mmol/L (ref 3–11)
AST (SGOT): 24 U/L (ref 15–41)
BILIRUBIN TOTAL: 0.2 mg/dL — ABNORMAL LOW (ref 0.3–1.2)
BUN/CREA RATIO: 17 (ref 6–22)
BUN: 10 mg/dL (ref 6–20)
CALCIUM: 9.6 mg/dL (ref 8.6–10.3)
CHLORIDE: 102 mmol/L (ref 101–111)
CO2 TOTAL: 27 mmol/L (ref 22–32)
CO2 TOTAL: 27 mmol/L (ref 22–32)
CREATININE: 0.6 mg/dL (ref 0.44–1.00)
CREATININE: 0.6 mg/dL (ref 0.44–1.00)
ESTIMATED GFR: >60 mL/min/{1.73_m2} (ref >60)
GLUCOSE: 112 mg/dL — ABNORMAL HIGH (ref 70–110)
POTASSIUM: 4.6 mmol/L (ref 3.4–5.1)
PROTEIN TOTAL: 7.5 g/dL (ref 6.4–8.3)
SODIUM: 137 mmol/L (ref 136–145)

## 2017-01-31 LAB — BLUE TOP TUBE

## 2017-01-31 LAB — TROPONIN-I: TROPONIN I: <0.03 ng/mL (ref <=0.06)

## 2017-01-31 LAB — RED TOP TUBE

## 2017-01-31 MED ORDER — MECLIZINE 25 MG TABLET
25.00 mg | ORAL_TABLET | Freq: Once | ORAL | Status: AC
Start: 2017-01-31 — End: 2017-01-31
  Administered 2017-01-31: 25 mg via ORAL
  Filled 2017-01-31: qty 1

## 2017-01-31 MED ORDER — SODIUM CHLORIDE 0.9 % (FLUSH) INJECTION SYRINGE
10.00 mL | INJECTION | Freq: Three times a day (TID) | INTRAMUSCULAR | Status: DC
Start: 2017-01-31 — End: 2017-01-31

## 2017-01-31 MED ORDER — MECLIZINE 25 MG TABLET: 25 mg | Tab | Freq: Three times a day (TID) | ORAL | 0 refills | 0 days | Status: DC | PRN

## 2017-01-31 NOTE — Patient Instructions (Signed)
[facility]  [address]  [place] [address]  Phone: [phone]  Fax: [phone]           Open Daily 8:00am - 8:00pm, except Sundays 12pm-8pm         ~ Closed Thanksgiving and Christmas Day     Attending Caregiver: Johney Maine, APRN    Today's orders: No orders of the defined types were placed in this encounter.       Prescription(s) E-Rx to:  [place], [place]    ________________________________________________________________________  Short Term Disability and [facility] does NOT provide assistance with any disability applications.  If you feel your medical condition requires you to be on disability, you will need to follow up with  Your primary care physician or a specialist.  We apologize for any inconvenience.    For Medication Prescribed by [facility]:  As an Urgent Care facility, our clinic does NOT offer prescription refills over the telephone.    If you need more of the medication one of our medical providers prescribed, you will  Either need to be re-evaluated by [place] or see your primary care physician.    ________________________________________________________________________      It is very important that we have a phone number that is the single best way to contact you in the event that we become aware of important clinical information or concerns after your discharge.  If the phone number you provided at registration is NOT this number you should inform staff and registration prior to leaving.      Your treatment and evaluation today was focused on identifying and treating potentially emergent conditions based on your presenting signs, symptoms, and history.  The resulting initial clinical impression and treatment plan is not intended to be definitive or a substitute for a full physical examination and evaluation by your primary care provider.  If your symptoms persist, worsen, or you develop any new or  concerning symptoms, you need to be evaluated.      If you received x-rays during your visit, be aware that the final and formal interpretation of those films by a radiologist may occur after your discharge.  If there is a significant discrepancy identified after your discharge, we will contact you at the telephone number provided at registration.      If you received a pelvic exam, you may have cultures pending for sexually transmitted diseases.  Positive cultures are reported to the [place] Department of Health as required by state law.  You should be contacted if you cultures are positive.  We will not contact you if they are negative.  You did NOT receive a PAP smear (the screening test for cervical).  This specific test for women is best performed by your gynecologist or primary care provider when indicated.      If you are over 71 year old, we cannot discuss your personal health information with a parent, spouse, family member, or anyone else without your express consent.  This does not include those who have legitimate access to your records and information to assist in your care under the provisions of HIPAA ([place]) law, or those to whom you have previously given express written consent to do so, such a legal guardian or Power of [place].      You may have received medication that may cause you to feel drowsy and/or light headed  for several hours.  You may even experience some amnesia of your stay.  You should avoid operating a motor vehicle or performing any activity requiring complete alertness or coordination until you feel fully awake (approximately 24-48 hours).  Avoid alcoholic beverages.  You may also have a dry mouth for several hours.  This is a normal side effect and will disappear as the effects of the medication wear off.      Instructions discussed with patient upon discharge by clinical staff with all questions answered.  Please call [facility]  [phone] if any further questions.  Go immediately to the emergency department if any concern or worsening symptoms.    Fleeta EmmerSandra Nicodemus Denk, APRN 01/31/2017, 13:55

## 2017-01-31 NOTE — Nursing Note (Signed)
BP 118/81   Pulse (!) 119   Temp 37.1 C (98.8 F) (Oral)    Ht 1.6 m (5\' 3" )   Wt 64 kg (141 lb)   LMP 01/09/2017 (Exact Date)   SpO2 97%   BMI 24.98 kg/m2    Lindsay Mccullough  01/31/2017, 13:19

## 2017-01-31 NOTE — Progress Notes (Signed)
[facility]  [address]  [place] [id number]       Name: Lindsay PeaksJessica Mccullough MRN:  [id number]   Date: 01/31/2017 Age: 31 y.o. 06/05/1985        Chief Complaint:   Chief Complaint     Nasal Congestion     Headache And Dizziness     Ear Pain both          HPI: Lindsay Mccullough is a 31 y.o. female who presents today with Nasal Congestion; Headache And Dizziness; and Ear Pain (both).    Patient presents with a 3 day history of dizziness, lightheadedness, headache reported as 9/10 pain, spinning room, unable to stand without falling,  Difficulty thinking, hard to focus, and "desire to sleep all the time". Near syncopal episodes, denies passing out or post episode fatigue.   Positive for bilateral ear pain, sinus congestion, clear rhinorrhea.  Patient is a smoker.  Patient states she had a URI 3 days ago and then her ear started hurting.    Denies chest pain, chest tightness, shortness of breath,  fever, chills.   Positive family history of BPPV,   However she has not been diagnosed with this condition.    She is not currently taking any medication for the symptoms. Tried Alkaseltzer cold with no improvement.   Here today with her mother.    History:  Vital signs and history as obtained by clinical staff.  Past Medical History:   Diagnosis Date   . Anxiety    . Asthma     controlled with prn Inhaler   . Back problem     chronic lower back pain   . Blood in stool 11/16/2016   . Chronic pain     chronic lower back pain   . Cigarette smoker    . Depression    . Dysplasia of cervix    . Elevated d-dimer     Hx of   . H/O seasonal allergies    . Hx of bronchitis most recent 05/2016   . Left shoulder pain    . Migraine    . 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 Margo AyeHall evaluated neck 11/17/16, occ. neck pain   . Nephrolithiasis    . Panic attack    . Shortness of breath     Recenty with SOB and left shoulder blader pain-has to sit up to breathe    . Transfusion  history 2013    with childbirth         Past Surgical History:   Procedure Laterality Date   . HX DILATION AND CURETTAGE  2006    sec. to miscarriage   . HX NO SURGICAL PROCEDURES           Family Medical History:     Problem Relation (Age of Onset)    Cirrhosis Father    Colon Cancer Other    Congestive Heart Failure Mother    Diabetes Paternal Grandmother    Hearing Loss Mother    Heart Attack Mother    Heart Disease Mother    High Cholesterol Mother, Father    Hypertension Mother, Father, Brother            Social History     Social History   . Marital status: Divorced     Spouse name: N/A   . Number of children: N/A   . Years of  education: N/A     Social History Main Topics   . Smoking status: Current Every Day Smoker     Packs/day: 0.25     Years: 17.00     Types: Cigarettes   . Smokeless tobacco: Never Used   . Alcohol use No   . Drug use: No   . Sexual activity: Not on file     Other Topics Concern   . Breast Self Exam No   . Ability To Walk 1 Flight Of Steps Without Sob/Cp Yes   . Routine Exercise Yes     walking   . Ability To Do Own Adl's Yes     Social History Narrative     Expanded Substance History     Additional history       Allergies:  Allergies   Allergen Reactions   . Amoxicillin Anaphylaxis   . Dilaudid [Hydromorphone] Shortness of Breath   . Spironolactone Rash and Hives/ Urticaria   . Iodine      Blisters on left arm   . Penicillins Hives/ Urticaria     Problem List:  Patient Active Problem List    Diagnosis   . Anxiety   . Depression   . Nephrolithiasis   . D-dimer, elevated   . Abnormal blood test   . Chest pain   . Elevated d-dimer   . Cyst of left ovary   . Hyperandrogenemia   . Elevated dehydroepiandrosterone (DHEA) level (CMS HCC)   . Asthma   . Hirsutism     Medication:  Outpatient Encounter Prescriptions as of 01/31/2017   Medication Sig Dispense Refill   . albuterol sulfate (PROVENTIL OR VENTOLIN OR PROAIR) 90 mcg/actuation Inhalation HFA Aerosol Inhaler Take 2 Puffs by inhalation Every  6 hours as needed      . amitriptyline (ELAVIL) 10 mg Oral Tablet Take 10 mg by mouth Every night     . ASPIRIN/ACETAMINOPHEN/CAFFEINE (EXCEDRIN MIGRAINE ORAL) Take 1 Tab by mouth Every 6 hours as needed      . capsaicin (ZOSTRIX) 0.025 % Cream by Apply Topically route Four times a day as needed (Patient not taking: Reported on 12/24/2016) 45 g 0   . cyclobenzaprine (FLEXERIL) 10 mg Oral Tablet 10 mg Three times a day   0   . fluticasone (FLONASE) 50 mcg/actuation Nasal Spray, Suspension 1 Spray by Each Nostril route Once a day     . gabapentin (NEURONTIN) 100 mg Oral Capsule Take 100 mg by mouth Three times a day     . MULTIVITS,CA,MINERALS/IRON/FA (ONE-A-DAY WOMENS FORMULA ORAL) Take 1 Tab by mouth Once a day      . oxyCODONE-acetaminophen (PERCOCET) 5-325 mg Oral Tablet Take 1-2 Tabs by mouth Every 4 hours as needed for Pain (Patient not taking: Reported on 12/24/2016) 10 Tab 0   . sertraline (ZOLOFT) 50 mg Oral Tablet Take 100 mg by mouth Once a day      . ZOLPIDEM TARTRATE (AMBIEN ORAL) Take 10 mg by mouth Every night as needed        No facility-administered encounter medications on file as of 01/31/2017.        Review of Systems:  Pertinent items are noted in HPI. All pertinent positives and negatives noted in the HPIAs plus below:  Constitutional: + recent illness, no fever, no chills, no night sweats, no weight loss, no loss of appetite.  Neuro: + dizziness. + lightheadedness. No syncope. No hx of seizures.  Eyes: No blurring of vision or diplopia.  ENT: No sore throat. No dysphagia. No odynophagia. No hearing loss. No tinnitus. + ear pain, + rhinorrhea,  sinus pains. No nasal congestion.  Respiratory: No cough. No shortness of breath.   Cardiovascular: No chest pain.  No palpitation. No exertional dyspnea.  Muskuloskeletal: No myalgias. No arthralgias.  GI: No abdominal pain. No nausea, vomiting, diarrhea.  Skin: No rashes.  Neuro: + headache    Exam:  General Vitals: BP 118/81  Pulse (!) 119  Temp 37.1  C (98.8 F) (Oral)   Ht 1.6 m (5\' 3" )  Wt 64 kg (141 lb)  LMP 01/09/2017 (Exact Date)  SpO2 97%  BMI 24.98 kg/m2      General: ill appearing, mild distress  Eyes: Conjunctivae/corneas clear, PERRLA.  Head - normocephalic, atraumatic.  Neck- supple  HENT:Left TM and canal normal.  Right TM and canal normal. Nose without erythema. Mouth mucous membranes moist. Pharynx without injection or exudate. No oral lesions.   Lungs: clear to auscultation bilaterally. No rales no rhonchi no wheezing  Cardiovascular: Heart regular rate and rhythm, no murmur  Skin: Skin color, texture, turgor normal.   Neurologic: alert and oriented x3. CN 2-12 grossly intact, ambulates with unstable gait, GCS 15      Assessment and Plan:    ICD-10-CM    1. Headache R51    2. Dizziness R42    3. Falling R29.6      Medication Orders   No Medications ordered       Due to patient's red flags of persistent headache, lightheadedness dizziness  With falling  Advised she go to the emergency department for further evaluation.  Patient is in agreement with this plan and will be driven to the ED by her mother.  Patient was in stable condition when she left the urgent care.  As  she had a viral illness in the past 3 days differential DDx  include labyrinthitis versus benign paroxysmal positional vertigo versus  Stroke-like symptoms   Which  cannot come be ruled out in the urgent care setting.  Discussed these possibilities with patient and she understands need for going to ED for further evaluation.  Return for sent to ER for further evaluation.    Fleeta EmmerSandra Gryphon Vanderveen, Dyann RuddleANP-C, CPNP-PC   Supervising Physician Dr. Allyson SabalBerry       Portions of this note may be dictated using voice recognition software or a dictation service. Variances in spelling and vocabulary are possible and unintentional. Not all errors are caught/corrected. Please notify the [facility] if any discrepancies are noted or if the meaning of any statement is not clear.

## 2017-01-31 NOTE — ED Triage Notes (Signed)
Patient c/o bilateral ear pain, chest pain, cough for 3 days. Patient ambulatory without assistance or difficulty on arrival, patient in no acute distress.

## 2017-01-31 NOTE — ED Provider Notes (Signed)
Idelle Crouch, MD  [facility]  Emergency Department Visit Note    Date:  01/31/2017  Primary care provider:  Gean Birchwood, MD  Means of arrival:  private car  History obtained from: patient  History limited by: none    Chief Complaint: Ear pain     HISTORY OF PRESENT ILLNESS     Lindsay Mccullough, date of birth [birth year], is a 31 y.o. female who presents to the Emergency Department complaining of constant ear pain that began three days ago. The patient reports associated chest congestion and discomfort, cough, dizziness and body aches. The patient reports being seen at Urgent Care today and sent here for further evaluation. She adds that she has recently gotten over a "stomach bug" with vomiting. She denies head injury, fever, shortness of breath, visual changes, numbness or tingling, or syncope.     REVIEW OF SYSTEMS     The pertinent positive and negative symptoms are as per HPI. All other systems reviewed and are negative.     PATIENT HISTORY     Past Medical History:  Past Medical History:   Diagnosis Date   . Anxiety    . Asthma     controlled with prn Inhaler   . Back problem     chronic lower back pain   . Blood in stool 11/16/2016   . Chronic pain     chronic lower back pain   . Cigarette smoker    . Depression    . Dysplasia of cervix    . Elevated d-dimer     Hx of   . H/O seasonal allergies    . Hx of bronchitis most recent 05/2016   . Left shoulder pain    . Migraine    . 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 Margo Aye evaluated neck 11/17/16, occ. neck pain   . Nephrolithiasis    . Panic attack    . Shortness of breath     Recenty with SOB and left shoulder blader pain-has to sit up to breathe    . Transfusion history 2013    with childbirth       Past Surgical History:  Past Surgical History:   Procedure Laterality Date   . Hx dilation and curettage  2006   . Hx no surgical procedures         Family History:  No history of  acute family illness given at this time.     Social History:  Social History   Substance Use Topics   . Smoking status: Current Every Day Smoker     Packs/day: 0.25     Years: 17.00     Types: Cigarettes   . Smokeless tobacco: Never Used   . Alcohol use No     History   Drug Use No       Medications:  Outpatient Prescriptions Marked as Taking for the 01/31/17 encounter [facility] Encounter)   Medication Sig   . albuterol sulfate (PROVENTIL OR VENTOLIN OR PROAIR) 90 mcg/actuation Inhalation HFA Aerosol Inhaler Take 2 Puffs by inhalation Every 6 hours as needed    . amitriptyline (ELAVIL) 10 mg Oral Tablet Take 10 mg by mouth Every night   . ASPIRIN/ACETAMINOPHEN/CAFFEINE (EXCEDRIN MIGRAINE ORAL) Take 1 Tab by mouth Every 6 hours as needed    . cyclobenzaprine (FLEXERIL) 10 mg Oral Tablet 10 mg Three times a day    .  gabapentin (NEURONTIN) 100 mg Oral Capsule Take 100 mg by mouth Three times a day   . MULTIVITS,CA,MINERALS/IRON/FA (ONE-A-DAY WOMENS FORMULA ORAL) Take 1 Tab by mouth Once a day    . ZOLPIDEM TARTRATE (AMBIEN ORAL) Take 10 mg by mouth Every night as needed        Allergies:  Allergies   Allergen Reactions   . Amoxicillin Anaphylaxis   . Dilaudid [Hydromorphone] Shortness of Breath   . Spironolactone Rash and Hives/ Urticaria   . Iodine      Blisters on left arm   . Penicillins Hives/ Urticaria       PHYSICAL EXAM     Vitals:  Filed Vitals:    01/31/17 1436   BP: 110/78   Pulse: (!) 110   Resp: 20   Temp: 36.8 C (98.3 F)   SpO2: 97%       Pulse ox  97% on None (Room Air) interpreted by me as: Normal    Constitutional: The patient is in no acute distress.    Head: Normocephalic and atraumatic.   ENT: Moist mucous membranes. No erythema or exudates in the oropharynx. TM's normal bilaterally.   Eyes: EOM are normal. Pupils are equal, round, and reactive to light. No scleral icterus.   Neck: Neck supple. No meningismus.  Cardiovascular: Normal rate and regular rhythm. No murmur heard. 2+ distal pulses all 4  extremities.  Pulmonary/Chest: Effort normal and breath sounds normal.   Abdominal: Soft. No distension. There is no tenderness.   Back: There is no CVA tenderness.   Musculoskeletal: Normal range of motion. No edema and no tenderness. No clubbing or cyanosis.  Neurological: Patient is alert and oriented to person, place, and time. Strength and sensation normal in all extremities. Normal facial symmetry and speech. CN II-XII normal.  No pronator drift.  No dysmetria. No truncal ataxia.  Skin: Skin is warm and dry. No rash noted.     DIAGNOSTIC STUDIES     Labs:    Results for orders placed or performed during the hospital encounter of 01/31/17   COMPREHENSIVE METABOLIC PROFILE - [facility] ONLY   Result Value Ref Range    SODIUM 137 136 - 145 mmol/L    POTASSIUM 4.6 3.4 - 5.1 mmol/L    CHLORIDE 102 101 - 111 mmol/L    CO2 TOTAL 27 22 - 32 mmol/L    ANION GAP 8 3 - 11 mmol/L    BUN 10 6 - 20 mg/dL    CREATININE 9.52 8.41 - 1.00 mg/dL    BUN/CREA RATIO 17 6 - 22    ESTIMATED GFR >60 >60 mL/min/1.32m^2    ALBUMIN 4.1 3.5 - 5.0 g/dL    CALCIUM 9.6 8.6 - 32.4 mg/dL    GLUCOSE 401 (H) 70 - 110 mg/dL    ALKALINE PHOSPHATASE 96 38 - 126 U/L    ALT (SGPT) 17 14 - 54 U/L    AST (SGOT) 24 15 - 41 U/L    BILIRUBIN TOTAL 0.2 (L) 0.3 - 1.2 mg/dL    PROTEIN TOTAL 7.5 6.4 - 8.3 g/dL    ALBUMIN/GLOBULIN RATIO 1.2 0.8 - 2.0   TROPONIN-I   Result Value Ref Range    TROPONIN I <0.03 <=0.06 ng/mL   CBC WITH DIFF   Result Value Ref Range    WBC 7.0 4.0 - 11.0 x10^3/uL    RBC 4.48 4.00 - 5.10 x10^6/uL    HGB 14.7 12.0 - 15.5 g/dL    HCT 44.2  36.0 - 45.0 %    MCV 98.5 (H) 82.0 - 97.0 fL    MCH 32.7 27.5 - 33.2 pg    MCHC 33.2 32.0 - 36.0 g/dL    RDW 16.113.0 09.611.0 - 04.516.0 %    PLATELETS 314 150 - 450 x10^3/uL    MPV 7.1 (L) 7.4 - 10.5 fL    NEUTROPHIL % 64 43 - 76 %    LYMPHOCYTE % 28 15 - 43 %    MONOCYTE % 8 5 - 12 %    EOSINOPHIL % 1 0 - 5 %    BASOPHIL % 0 0 - 3 %    NEUTROPHIL # 4.50 1.50 - 6.50 x10^3/uL    LYMPHOCYTE # 1.90 1.00 - 4.80  x10^3/uL    MONOCYTE # 0.50 0.20 - 0.90 x10^3/uL    EOSINOPHIL # 0.10 0.00 - 0.50 x10^3/uL    BASOPHIL # 0.00 0.00 - 0.10 x10^3/uL   BLUE TOP TUBE   Result Value Ref Range    RAINBOW/EXTRA TUBE AUTO RESULT Yes      Labs reviewed and interpreted by me.    Radiology:    XR CHEST PA & LATERAL   Final Result   No acute radiographic abnormality..     Radiological imaging interpreted by radiologist and independently reviewed by me.    EKG:  12 lead EKG interpreted by me shows sinus tachycardia rhythm, rate of 117 bpm, right axis deviation, incomplete right BBB.     ED PROGRESS NOTE / MEDICAL DECISION MAKING     Old records reviewed by me:  I reviewed prior available records and pertinent past medical history.      Orders Placed This Encounter   . XR CHEST PA & LATERAL   . COMPREHENSIVE METABOLIC PROFILE - [facility] ONLY   . TROPONIN-I   . RAINBOW DRAW - [facility] ONLY   . CBC WITH DIFF   . ECG 12-LEAD (Take to provider with a brief history)   . INSERT & MAINTAIN PERIPHERAL IV ACCESS   . NS flush syringe   . meclizine (ANTIVERT) tablet     1713: Initial evaluation complete at this time. Patient had a chest xray, CBC, CMP, troponin, and EKG ordered prior to evaluation. I have discussed the results of the diagnostic studies. I explained my suspicion that this is likely a viral illness with vertiginous like symptoms. I discussed the plan to treat with Antivert PO and monitor her here. She understands and is in accordance with the treatment plan at this time.     1800: On recheck, the patient has tolerated the Antivert here and will be discharged with prescription for same. Patient is to follow up with PCP as needed. I discussed the diagnosis, disposition, and follow-up plan. The patient understood and is in accordance with the treatment plan at this time. Patient is to return here to the Emergency Department if new or worsening symptoms appear. All of their questions have been answered to their satisfaction. The patient is in  stable condition at the time of discharge.    MIPS     Not applicable     OPIATE PRESCRIPTION      Not applicable    CORE MEASURES     Not applicable    CRITICAL CARE TIME     Not applicable     PRE-DISPOSITION VITALS      Pre-Disposition Vitals:  Filed Vitals:    01/31/17 1436   BP: 110/78   Pulse: Marland Kitchen(!)  110   Resp: 20   Temp: 36.8 C (98.3 F)   SpO2: 97%     CLINICAL IMPRESSION     1. Viral illness  2. Vertigo     DISPOSITION/PLAN     Discharged        Prescriptions:      Details   meclizine (ANTIVERT) 25 mg Oral Tablet Take 1 Tab (25 mg total) by mouth Three times a day as needed, Disp-30 Tab, R-0, Print       Follow-Up:     Gean Birchwoodabuena, Philomela, MD  [facility]  [address]  [address]  [place] [id number]  [phone]  Call  As needed for persistent symptoms      Condition at Disposition: Stable        SCRIBE ATTESTATION STATEMENT  I Jobie QuakerMahayla Moore, SCRIBE scribed for Idelle CrouchMuyderman, Cailee Blanke M, MD on 01/31/2017 at 5:10 PM.     Documentation assistance provided for Idelle CrouchMuyderman, Redford Behrle M, MD  by Jobie QuakerMahayla Moore, SCRIBE. Information recorded by the scribe was done at my direction and has been reviewed and validated by me Renee RivalMuyderman, Araceli BoucheJoshua M, MD.

## 2017-02-01 LAB — ECG 12-LEAD
Atrial Rate: 117 {beats}/min
Calculated P Axis: 70 degrees
Calculated R Axis: 94 degrees
Calculated T Axis: 46 degrees
PR Interval: 156 ms
QRS Duration: 100 ms
QT Interval: 336 ms
QTC Calculation: 468 ms
Ventricular rate: 117 {beats}/min

## 2017-02-05 ENCOUNTER — Encounter (INDEPENDENT_AMBULATORY_CARE_PROVIDER_SITE_OTHER): Payer: Self-pay

## 2017-02-05 ENCOUNTER — Ambulatory Visit (INDEPENDENT_AMBULATORY_CARE_PROVIDER_SITE_OTHER): Payer: No Typology Code available for payment source | Admitting: Physician Assistant

## 2017-02-05 VITALS — BP 121/78 | HR 107 | Temp 98.0°F | Resp 16 | Ht 63.0 in | Wt 143.0 lb

## 2017-02-05 DIAGNOSIS — R42 Dizziness and giddiness: Secondary | ICD-10-CM

## 2017-02-05 DIAGNOSIS — H9203 Otalgia, bilateral: Secondary | ICD-10-CM

## 2017-02-05 DIAGNOSIS — H6592 Unspecified nonsuppurative otitis media, left ear: Secondary | ICD-10-CM

## 2017-02-05 DIAGNOSIS — R000 Tachycardia, unspecified: Secondary | ICD-10-CM

## 2017-02-05 MED ORDER — PREDNISONE 10 MG PO TABS
30.0000 mg | ORAL_TABLET | Freq: Every day | ORAL | 0 refills | Status: AC
Start: 2017-02-05 — End: 2017-02-10

## 2017-02-05 NOTE — Progress Notes (Signed)
Subjective:    Patient ID: Regina Adams is a 31 y.o. female.    Otalgia    There is pain in both ears. This is a new problem. Episode onset: 2 weeks. The problem occurs constantly. The problem has been unchanged. There has been no fever. The pain is at a severity of 10/10. The pain is severe. Associated symptoms include hearing loss (mild, on the L ear). Pertinent negatives include no abdominal pain, coughing, diarrhea, ear discharge, headaches, neck pain, rash, rhinorrhea, sore throat or vomiting. Treatments tried: meclizine. Otc decongestants.  The treatment provided no relief.        Pt seen in the [facility] ED on 01/31/17. At that time was having constant ear pain and dizziness.   Normal exam charted. Negative chest xray. Normal basic labs, negative troponin. Was prescribed meclizine.     Pt reports episodic dizziness at times. States this seems to be exacerbated at night when she lies in bed. States also has instantaneous bouts throughout the day.   Denies meclizine helped.     Has been taking tylenol cold/sinus, theraflu, alka seltzer.     Past Medical History:   Diagnosis Date   . Anemia    . Anxiety    . Asthma without status asthmaticus    . Depression    . Hormone disorder    . 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    The following portions of the patient's history were reviewed and updated as appropriate: allergies, current medications, past family history, past medical history, past social history, past surgical history and problem list.    Review of Systems   Constitutional: Negative for chills, fatigue and fever.   HENT: Positive for ear pain and hearing loss (mild, on the L ear). Negative for ear discharge, rhinorrhea and sore throat.    Eyes: Negative for pain and redness.   Respiratory: Negative for cough, chest tightness, shortness of breath and wheezing.    Cardiovascular: Negative for chest pain and palpitations.   Gastrointestinal: Negative for abdominal pain,  diarrhea, nausea and vomiting.   Genitourinary: Negative for dysuria.   Musculoskeletal: Negative for arthralgias, joint swelling and neck pain.   Skin: Negative for rash.   Neurological: Positive for dizziness. Negative for syncope, light-headedness and headaches.   Psychiatric/Behavioral: Negative for confusion.         Objective:    BP 121/78   Pulse (!) 107   Temp 98 F (36.7 C) (Oral)   Resp 16   Ht 1.6 m (5\' 3" )   Wt 64.9 kg (143 lb)   LMP 01/14/2017   BMI 25.33 kg/m     Physical Exam   Constitutional: She is oriented to person, place, and time. She appears well-developed and well-nourished. No distress.   HENT:   Head: Normocephalic and atraumatic.   Right Ear: Tympanic membrane, external ear and ear canal normal. No tenderness. No foreign bodies. Tympanic membrane is not injected, not erythematous, not retracted and not bulging. No middle ear effusion.   Left Ear: External ear and ear canal normal. No tenderness. No foreign bodies. Tympanic membrane is not injected, not erythematous, not retracted and not bulging. A middle ear effusion is present.   Nose: Right sinus exhibits no maxillary sinus tenderness and no frontal sinus tenderness. Left sinus exhibits no maxillary sinus tenderness and no frontal sinus tenderness.   Mouth/Throat: Uvula is midline, oropharynx is clear and moist  and mucous membranes are normal. No oropharyngeal exudate. Tonsils are 1+ on the right. Tonsils are 1+ on the left. No tonsillar exudate.   Eyes: Conjunctivae are normal. Right eye exhibits no discharge and no exudate. Left eye exhibits no discharge and no exudate.   Neck: Neck supple.   Cardiovascular: Regular rhythm.  Tachycardia present.  Exam reveals no gallop and no friction rub.    No murmur heard.  Mildly tachy on exam     Pulmonary/Chest: Effort normal and breath sounds normal. No accessory muscle usage. No respiratory distress. She has no wheezes. She has no rhonchi. She has no rales. She exhibits no tenderness.    Lymphadenopathy:     She has no cervical adenopathy.   Neurological: She is alert and oriented to person, place, and time. She displays a negative Romberg sign. Coordination and gait normal.   Cranial nerve II through XII intact  5+ strength of bilateral upper and lower extremity     Skin: Skin is warm and dry. No rash noted. She is not diaphoretic.   Psychiatric: She has a normal mood and affect.   Nursing note and vitals reviewed.        Assessment and Plan:       Monai was seen today for otitis media.    Diagnoses and all orders for this visit:    Acute otalgia, bilateral    Fluid level behind tympanic membrane of left ear    Vertigo, intermittent    Tachycardia    Other orders  -     predniSONE (DELTASONE) 10 MG tablet; Take 3 tablets (30 mg total) by mouth daily.for 5 days    No sign of infection. TM's clear without bulging or erythema.  Trial of prednisone x 5 days for inner ear effusion and inflammation  Recommend tylenol/ibuprofen for pain  Flonase for nasal congestion.   May add a decongestant, coricidin if high BP  F/U for worsening s/s  Also, gave pt handout of Epley maneuver to perform at home if steroid and other meds not improving symptoms  If still continuing, PCP f/u    Pt agrees with plan        Tillman Abide, PA  [facility]  02/05/2017  10:36 AM

## 2017-02-05 NOTE — Patient Instructions (Signed)
Earache, No Infection (Adult)  Earaches can happen without an infection. This occurs when air and fluid build up behind the eardrum causing a feeling of fullness and discomfort and reduced hearing. This is called otitis media with effusion (OME) or serous otitis media. It means there is fluid in the middle ear. It is not the same as acute otitis media, which is typically from infection.  OME can happen when you have a cold if congestion blocks the passage that drains the middle ear. This passage is called the eustachian tube. OME may also occur with nasal allergies or after a bacterial middle ear infection.    The pain or discomfort may come and go. You may hear clicking or popping sounds when you chew or swallow. You may feel that your balance is off. Or you may hear ringing in the ear.  It often takes from several weeks up to 3 months for the fluid to clear on its own. Oral pain relievers and ear drops help if there is pain. Decongestants and antihistamines sometimes help. Antibiotics don't help since there is no infection. Your doctor may prescribe a nasal spray to help reduce swelling in the nose and eustachian tube. This can allow the ear to drain.  If your OME doesn't improve after 3 months, surgery may be used to drain the fluid and insert a small tube in the eardrum to allow continued drainage.  Because the middle ear fluid can become infected, it is important to watch for signs of an ear infection which may develop later. These signs include increased ear pain, fever, or drainage from the ear.  Home care  The following guidelines will help you care for yourself at home:   You may use over-the-counter medicine as directed to control pain, unless another medicine was prescribed. If you have chronic liver or kidney disease or ever had a stomach ulcer or GI bleeding, talk with your doctor before using these medicines. Aspirin should never be used in anyone under 18 years of age who is ill with a fever. It  may cause severe liver damage.   You may use over-the-counter decongestants such as phenylephrine or pseudoephedrine. But they are not always helpful. Don't use nasal spray decongestants more than 3 days. Longer use can make congestion worse. Prescription nasal sprays from your doctor don't typically have those restrictions.   Antihistamines may help if you are also having allergy symptoms.   You may use medicines such as guaifenesin to thin mucus and promote drainage.  Follow-up care  Follow up with your healthcare provider or as advised if you are not feeling better after 3 days.  When to seek medical advice  Call your healthcare provider right away if any of the following occur:   Your ear pain gets worse or does not start to improve   Fever of 100.4F (38C) or higher, or as directed by your healthcare provider   Fluid or blood draining from the ear   Headache or sinus pain   Stiff neck   Unusual drowsiness or confusion  Date Last Reviewed: 12/01/2014   2000-2016 [employer] [address], [place] [address]. All rights reserved. This information is not intended as a substitute for professional medical care. Always follow your healthcare professional's instructions.

## 2017-02-08 ENCOUNTER — Telehealth (INDEPENDENT_AMBULATORY_CARE_PROVIDER_SITE_OTHER): Payer: Self-pay

## 2017-02-08 NOTE — Telephone Encounter (Signed)
Follow up call made to patient. Spoke with patient. Feeling about the same. Stated she called her PCP but they are booked until January and are not making appointments for next year yet. Let her know that she is welcome to come for a follow up if the medicine does not begin to help within the next day or so. Let them know to call us back with any questions or concerns.

## 2017-02-10 ENCOUNTER — Other Ambulatory Visit (INDEPENDENT_AMBULATORY_CARE_PROVIDER_SITE_OTHER): Payer: Self-pay | Admitting: Family Medicine

## 2017-02-12 ENCOUNTER — Encounter (INDEPENDENT_AMBULATORY_CARE_PROVIDER_SITE_OTHER): Payer: Self-pay | Admitting: Family

## 2017-02-12 ENCOUNTER — Ambulatory Visit (INDEPENDENT_AMBULATORY_CARE_PROVIDER_SITE_OTHER): Payer: No Typology Code available for payment source | Admitting: Family

## 2017-02-12 VITALS — BP 116/89 | HR 108 | Temp 98.7°F | Resp 16 | Ht 63.0 in | Wt 145.0 lb

## 2017-02-12 DIAGNOSIS — H6692 Otitis media, unspecified, left ear: Secondary | ICD-10-CM

## 2017-02-12 MED ORDER — AZITHROMYCIN 250 MG PO TABS
ORAL_TABLET | ORAL | 0 refills | Status: DC
Start: 2017-02-12 — End: 2017-02-19

## 2017-02-12 NOTE — Patient Instructions (Addendum)
Take all of your medication  Increase fluid intake  Antipyretics for any pain or fever  -Follow up with PCP if symptoms lasting longer than expected  -RTC or ER if new/worsening symptoms  -Pt/guardian agreed to plan    Middle Ear Infection (Adult)  You have an infection of the middle ear, the space behind the eardrum. This is also called acute otitis media (AOM). Sometimes it is caused by the common cold. This is because congestion can block the internal passage (eustachian tube) that drains fluid from the middle ear. When the middle ear fills with fluid, bacteria can grow there and cause an infection. Oral antibiotics are used to treat this illness, not ear drops. Symptoms usually start to improve within 1 to 2 days of treatment.    Home care  The following are general care guidelines:   Finish all of the antibiotic medicine given, even though you may feel better after the first few days.   You may use over-the-counter medicine, such as acetaminophen or ibuprofen, to control pain and fever, unless something else was prescribed. If you have chronic liver or kidney disease or have ever had a stomach ulcer or gastrointestinal bleeding, talk with your healthcare provider before using these medicines. Do not give aspirin to anyone under 18 years of age who has a fever. It may cause severe illness or death.  Follow-up care  Follow up with your healthcare provider, or as advised, in 2 weeks if all symptoms have not gotten better, or if hearing doesn't go back to normal within 1 month.  When to seek medical advice  Call your healthcare provider right away if any of these occur:   Ear pain gets worse or does not improve after 3 days of treatment   Unusual drowsiness or confusion   Neck pain, stiff neck, or headache   Fluid or blood draining from the ear canal   Fever of 100.4F (38C) or as advised   Seizure  Date Last Reviewed: 08/01/2014   2000-2016 [employer] [address], [place]  [address]. All rights reserved. This information is not intended as a substitute for professional medical care. Always follow your healthcare professional's instructions.

## 2017-02-12 NOTE — Progress Notes (Signed)
Subjective:    Patient ID: Regina Adams is a 31 y.o. female.    Otalgia    There is pain in the left ear. This is a new problem. The current episode started 1 to 4 weeks ago (3 weeks). The problem occurs constantly. The problem has been unchanged. There has been no fever. The pain is at a severity of 9/10. The pain is severe. Pertinent negatives include no abdominal pain, coughing, diarrhea, ear discharge, headaches, hearing loss, neck pain, rash, rhinorrhea, sore throat or vomiting. She has tried nothing for the symptoms.       The following portions of the patient's history were reviewed and updated as appropriate: allergies, current medications, past family history, past medical history, past social history, past surgical history and problem list.    Review of Systems   HENT: Positive for ear pain. Negative for ear discharge, hearing loss, rhinorrhea and sore throat.    Respiratory: Negative for cough.    Gastrointestinal: Negative for abdominal pain, diarrhea and vomiting.   Musculoskeletal: Negative for neck pain.   Skin: Negative for rash.   Neurological: Negative for headaches.         Objective:    BP 116/89   Pulse (!) 108   Temp 98.7 F (37.1 C) (Oral)   Resp 16   Ht 1.6 m (5\' 3" )   Wt 65.8 kg (145 lb)   LMP 02/11/2017   BMI 25.69 kg/m     Physical Exam   Constitutional: She is oriented to person, place, and time. Vital signs are normal. She appears well-developed and well-nourished. No distress.   HENT:   Head: Normocephalic and atraumatic.   Right Ear: Hearing, tympanic membrane, external ear and ear canal normal.   Left Ear: External ear and ear canal normal. Tympanic membrane is injected. Decreased hearing is noted.   Nose: Nose normal.   Mouth/Throat: Uvula is midline, oropharynx is clear and moist and mucous membranes are normal. No oropharyngeal exudate.   Eyes: Pupils are equal, round, and reactive to light.   Neck: Neck supple.   Cardiovascular: Normal rate, regular rhythm and  normal heart sounds.    No murmur heard.  Pulmonary/Chest: Effort normal and breath sounds normal. No respiratory distress.   Neurological: She is alert and oriented to person, place, and time.   Skin: Skin is warm and dry. Capillary refill takes less than 2 seconds. She is not diaphoretic.   Nursing note and vitals reviewed.     Labs  No results found for this or any previous visit (from the past 24 hour(s)).    Radiology  No results found.        Assessment and Plan:       There are no diagnoses linked to this encounter.    Take all of your medication  Increase fluid intake  Antipyretics for any pain or fever  -Follow up with PCP if symptoms lasting longer than expected  -RTC or ER if new/worsening symptoms  -Pt/guardian agreed to plan      Etter Sjogren, NP  [facility]  02/12/2017  10:13 AM

## 2017-02-15 ENCOUNTER — Telehealth (INDEPENDENT_AMBULATORY_CARE_PROVIDER_SITE_OTHER): Payer: Self-pay

## 2017-02-15 NOTE — Telephone Encounter (Signed)
Voicemail left to inform patient she needs an appointment.

## 2017-02-15 NOTE — Telephone Encounter (Signed)
Yes will need to see her first.

## 2017-02-15 NOTE — Telephone Encounter (Signed)
Patient was seen at [facility] about 4 days ago and was told she has hearing loss and was told she needs to see ENT, she was put steroids and antibiotic and it is not helping. Didn't know if you wanted to see her first or not?

## 2017-02-19 ENCOUNTER — Ambulatory Visit (INDEPENDENT_AMBULATORY_CARE_PROVIDER_SITE_OTHER): Payer: No Typology Code available for payment source | Admitting: Family Medicine

## 2017-02-19 ENCOUNTER — Encounter (INDEPENDENT_AMBULATORY_CARE_PROVIDER_SITE_OTHER): Payer: Self-pay | Admitting: Family Medicine

## 2017-02-19 VITALS — BP 104/66 | HR 100 | Temp 98.1°F | Resp 18 | Ht 63.0 in | Wt 145.4 lb

## 2017-02-19 DIAGNOSIS — F17200 Nicotine dependence, unspecified, uncomplicated: Secondary | ICD-10-CM | POA: Insufficient documentation

## 2017-02-19 DIAGNOSIS — H9202 Otalgia, left ear: Secondary | ICD-10-CM | POA: Insufficient documentation

## 2017-02-19 MED ORDER — FLUOCINOLONE ACETONIDE 0.01 % OT OIL
5.00 [drp] | TOPICAL_OIL | Freq: Two times a day (BID) | OTIC | 0 refills | Status: AC
Start: 2017-02-19 — End: 2017-03-05

## 2017-02-19 NOTE — Progress Notes (Signed)
Subjective:    Patient ID: Regina Adams is a 31 y.o. female.    She is here today for FU multiple UC visits and 2 ER visits since the start of this month for otitis. She received antibiotic PO and steroids by mouth but she continues to have left ear pain, dizziness from sudden movement and especially when she lays down. She also has "thumoing" in her ear.   No fever or chills. No ear discharge. She continues to smoke.      Past Medical History:   Diagnosis Date   . Anemia    . Anxiety    . Asthma without status asthmaticus    . Depression    . Hormone disorder    . 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 KIDNEY STONE SURGERY  11/20/2016    SOUND WAVE BLAST   . NO PAST SURGERIES       Family History   Problem Relation Age of Onset   . Hyperlipidemia Mother    . Hypertension Mother    . Heart attack Mother    . Arthritis Mother    . Cancer Father    . Hypertension Father    . Hyperlipidemia Father    . Diabetes Father    . Hypertension Maternal Uncle    . Diabetes Maternal Grandmother      Social History     Social History   . Marital status: Divorced     Spouse name: N/A   . Number of children: N/A   . Years of education: N/A     Occupational History   . Not on file.     Social History Main Topics   . Smoking status: Current Every Day Smoker     Types: Cigarettes   . Smokeless tobacco: Former Neurosurgeon     Quit date: 09/19/2013      Comment: 2 per day   . Alcohol use No   . Drug use: No   . Sexual activity: No     Other Topics Concern   . Not on file     Social History Narrative   . No narrative on file     The following portions of the patient's history were reviewed and updated as appropriate: allergies, current medications, past family history, past medical history, past social history, past surgical history and problem list.    Review of Systems   Constitutional: Positive for fatigue. Negative for chills and fever.   HENT: Positive for congestion,  ear pain and hearing loss (on left ear). Negative for ear discharge, facial swelling, postnasal drip, sinus pain and sinus pressure.    Respiratory: Negative for cough and shortness of breath.    Cardiovascular: Negative for chest pain and palpitations.   Gastrointestinal: Negative.    Skin: Negative for rash.           Objective:    Physical Exam   Constitutional: She appears well-developed and well-nourished. No distress.   HENT:   Head: Normocephalic and atraumatic.   Right Ear: External ear normal.   Left Ear: External ear normal.   Mouth/Throat: No oropharyngeal exudate.   Eyes: Conjunctivae and EOM are normal. No scleral icterus.   Neck: Neck supple. No thyromegaly present.   Cardiovascular: Normal heart sounds.    Pulmonary/Chest: Breath sounds normal.   Lymphadenopathy:     She has no  cervical adenopathy.   Nursing note and vitals reviewed.          Assessment:       1. Left ear pain    2. Smoker          Plan:       1. Trail of Fluocinolone otic to left ear. She may take NSAID's for pain. Her left ear does not appear erythematous nor bulging. Stop smoking advised. [facility] of referral to ENT will facilitate this.   2, Smoker - More than 3 minutes spent on smoking cessation counseling. Advised patient to quit. Patient was made aware of the health risks of smoking. Offered smoking cessation medication. Not motivated to quit.

## 2017-03-17 ENCOUNTER — Ambulatory Visit (INDEPENDENT_AMBULATORY_CARE_PROVIDER_SITE_OTHER): Payer: No Typology Code available for payment source | Attending: Family

## 2017-03-17 ENCOUNTER — Encounter (INDEPENDENT_AMBULATORY_CARE_PROVIDER_SITE_OTHER): Payer: Self-pay | Admitting: Family

## 2017-03-17 ENCOUNTER — Ambulatory Visit (INDEPENDENT_AMBULATORY_CARE_PROVIDER_SITE_OTHER): Payer: No Typology Code available for payment source | Admitting: Family

## 2017-03-17 ENCOUNTER — Ambulatory Visit (INDEPENDENT_AMBULATORY_CARE_PROVIDER_SITE_OTHER): Payer: No Typology Code available for payment source

## 2017-03-17 VITALS — BP 116/81 | HR 123 | Temp 98.2°F | Resp 18 | Ht 61.0 in | Wt 146.2 lb

## 2017-03-17 DIAGNOSIS — W19XXXA Unspecified fall, initial encounter: Secondary | ICD-10-CM

## 2017-03-17 DIAGNOSIS — S300XXA Contusion of lower back and pelvis, initial encounter: Secondary | ICD-10-CM

## 2017-03-17 DIAGNOSIS — Y998 Other external cause status: Secondary | ICD-10-CM

## 2017-03-17 DIAGNOSIS — Y9389 Activity, other specified: Secondary | ICD-10-CM

## 2017-03-17 DIAGNOSIS — M545 Low back pain, unspecified: Secondary | ICD-10-CM

## 2017-03-17 DIAGNOSIS — Y9289 Other specified places as the place of occurrence of the external cause: Secondary | ICD-10-CM

## 2017-03-17 MED ORDER — IBUPROFEN 800 MG PO TABS
800.0000 mg | ORAL_TABLET | Freq: Three times a day (TID) | ORAL | 0 refills | Status: AC | PRN
Start: 2017-03-17 — End: ?

## 2017-03-17 NOTE — Patient Instructions (Signed)
Back Pain (Acute or Chronic)    Back pain is one of the most common problems. The good news is that most people feel better in 1 to 2 weeks, and most of the rest in 1 to 2 months. Most people can remain active.  People experience and describe pain differently; not everyone is the same.   The pain can be sharp, stabbing, shooting, aching, cramping or burning.   Movement, standing, bending, lifting, sitting, or walking may worsen pain.   It can be localized to one spot or area, or it can be more generalized.   It can spread or radiate upwards, to the front, or go down your arms or legs (sciatica).   It can cause muscle spasm.  Most of the time, mechanical problems with the musclesor spine cause the pain. Mechanical problemsare usually caused by an injury to the muscles or ligaments. While illness can cause back pain, it is usually not caused by a serious illness. Mechanical problems include:   Physical activity such as sports, exercise, work, or normal activity   Overexertion, lifting, pushing, pulling incorrectly or too aggressively   Sudden twisting, bending, or stretching from an accident, or accidental movement   Poor posture   Stretching or moving wrong, without noticing pain at the time   Poor coordination, lack of regular exercise (check with your doctor about this)   Spinal disc disease or arthritis   Stress  Pain can also be related to pregnancy, or illness like appendicitis, bladder or kidney infections, pelvic infections, and many other things.  Acute back pain usually gets better in1 to 2 weeks. Back pain related to disk disease, arthritis in the spinal joints or spinal stenosis (narrowing of the spinal canal) can become chronic and last for months or years.  Unless you had a physical injury (for example, a car accident or fall) X-rays are usually not needed for the initial evaluation of back pain. If pain continues and does not respond to medical treatment, X-rays and other tests may be  needed.  Home care  Try these home care recommendations:   When in bed, tryto find a position of comfort. A firm mattress is best. Try lying flat on your back with pillows under your knees. You can also try lying on your side with your knees bent up towards your chest and a pillow between your knees.   At first, do not try to stretch out the sore spots. If there is a strain, it is not like the good soreness you get after exercising without an injury. In this case, stretching may make it worse.   Avoid prolong sitting, long car rides, or travel. This puts more stress on the lower back than standing or walking.   During the first 24 to 72 hours after an acute injury or flare up of chronic back pain, apply an ice pack to the painful area for 20 minutes and then remove it for 20 minutes. Do this over a period of 60 to 90 minutes or several times a day. This will reduce swelling and pain. Wrap the ice pack in a thin towel or plastic to protect your skin.   You can start with ice, then switch to heat. Heat (hot shower, hot bath, or heating pad) reduces pain and works well for muscle spasms. Heat can be applied to the painful area for 20 minutes then remove it for 20 minutes. Do this over a period of 60 to 90 minutes or several times   a day. Do not sleep on a heating pad. It can lead to skin burns or tissue damage.   You can alternate ice and heat therapy. Talk with your doctor aboutthe best treatment for your back pain.   Therapeutic massage can help relax the back muscles without stretching them.   Be aware of safe lifting methods and do not lift anything without stretching first.  Medicines  Talk to your doctor before using medicine, especially if you have other medical problems or are taking other medicines.   You may use over-the-counter medicine as directed on the bottle to control pain, unless another pain medicine was prescribed. If you have chronic conditions like diabetes, liver or kidney disease,  stomach ulcers, or gastrointestinal bleeding, or are taking blood thinners, talk to your doctor before taking any medicine.   Be careful if you are given a prescription medicines, narcotics, or medicine for muscle spasms. They can cause drowsiness, affect your coordination, reflexes, and judgement. Do not drive or operate heavy machinery.  Follow-up care  Follow up with your healthcare provider, or as advised.  A radiologist will review any X-rays that were taken. Your provide will notify you of any new findings that may affect your care.  Call 911  Call emergency services if any of the following occur:   Trouble breathing   Confusion   Very drowsy or trouble awakening   Fainting or loss of consciousness   Rapid or very slow heart rate   Loss of bowel or bladder control  When to seek medical advice  Call your healthcare provider right away if any of these occur:   Pain becomes worse or spreads to your legs   Weakness or numbness in one or both legs   Numbness in the groin or genital area  Date Last Reviewed: 08/31/2014   2000-2016 [employer] [address], [place] [address]. All rights reserved. This information is not intended as a substitute for professional medical care. Always follow your healthcare professional's instructions.

## 2017-03-17 NOTE — Progress Notes (Signed)
Subjective:    Patient ID: Regina Adams is a 32 y.o. female.    Fall   The accident occurred 12 to 24 hours ago (yesterday fell on bottom). She landed on concrete. The point of impact was the buttocks. The pain is at a severity of 7/10. The pain is moderate. The symptoms are aggravated by pressure on injury, standing, rotation, sitting, movement, extension and ambulation. Pertinent negatives include no abdominal pain, bowel incontinence, fever, headaches, hearing loss, hematuria, loss of consciousness, nausea, numbness, tingling, visual change or vomiting. She has tried ice, heat and acetaminophen for the symptoms. The treatment provided no relief.       The following portions of the patient's history were reviewed and updated as appropriate: allergies, current medications, past family history, past medical history, past social history, past surgical history and problem list.    Review of Systems   Constitutional: Negative for activity change, appetite change, chills, diaphoresis, fatigue, fever and unexpected weight change.   HENT: Negative for congestion, dental problem, ear discharge, ear pain, facial swelling, rhinorrhea, sinus pain, sinus pressure, sneezing, sore throat, tinnitus and trouble swallowing.    Eyes: Negative for pain, discharge, redness and itching.   Respiratory: Negative for chest tightness.    Cardiovascular: Negative for chest pain.   Gastrointestinal: Negative for abdominal pain, bowel incontinence, nausea and vomiting.   Genitourinary: Negative for difficulty urinating and hematuria.   Musculoskeletal: Positive for back pain and gait problem. Negative for arthralgias, joint swelling, myalgias and neck pain.   Skin: Negative for color change, pallor, rash and wound.   Neurological: Negative for dizziness, tingling, tremors, seizures, loss of consciousness, facial asymmetry, speech difficulty, weakness, light-headedness, numbness and headaches.   Hematological: Does not bruise/bleed  easily.         Objective:    BP 116/81 (BP Site: Right arm, Patient Position: Sitting, Cuff Size: Medium)   Pulse (!) 123   Temp 98.2 F (36.8 C) (Oral)   Resp 18   Ht 1.549 m (5\' 1" )   Wt 66.3 kg (146 lb 3.2 oz)   LMP 03/17/2017 (Exact Date)   BMI 27.62 kg/m     Physical Exam   Constitutional: She is oriented to person, place, and time. She appears well-developed and well-nourished.   HENT:   Head: Normocephalic.   Right Ear: Hearing, tympanic membrane, external ear and ear canal normal.   Left Ear: Hearing, tympanic membrane, external ear and ear canal normal.   Nose: Nose normal.   Mouth/Throat: Uvula is midline, oropharynx is clear and moist and mucous membranes are normal.   Neck: Normal range of motion.   Cardiovascular: Normal rate, regular rhythm and normal heart sounds.    Pulmonary/Chest: Effort normal and breath sounds normal.   Musculoskeletal:        Lumbar back: She exhibits decreased range of motion, tenderness, bony tenderness, swelling and pain. She exhibits no deformity, no laceration and no spasm.        Back:    Neurological: She is alert and oriented to person, place, and time.   Skin: Skin is warm and dry.   Psychiatric: She has a normal mood and affect. Her behavior is normal. Thought content normal.         Assessment and Plan:       Regina Adams was seen today for fall.    Diagnoses and all orders for this visit:    Acute bilateral low back pain without sciatica  -  X-ray Sacrum and Coccyx  -     Cancel: X-ray Lumbar Spine Complete  -     X-ray Lumbar Spine AP And Lateral  -     ibuprofen (ADVIL,MOTRIN) 800 MG tablet; Take 1 tablet (800 mg total) by mouth every 8 (eight) hours as needed for Pain.    X-ray Lumbar Spine Ap And Lateral    Result Date: 03/17/2017  1. 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 2. No evidence for acute bony injuries of the lumbar spine. 3. Possible tiny calyceal stone of the left kidney. ReadingStation:[id number]    X-ray Sacrum And Coccyx    Result Date:  03/17/2017  Normal sacrum and coccyx. ReadingStation:[id number]      For acute pain, rest, intermittent application of cold packs (later, may switch to heat, but do not sleep on heating pad), analgesics and muscle relaxants are recommended. Discussed longer term treatment plan of prn NSAID's and discussed a home back care exercise program with flexion exercise routine. Proper lifting with avoidance of heavy lifting discussed. Consider Physical Therapy and XRay studies if not improving.       -Discussed Musculoskeletal Therapies referral    Karrie Doffing [phone]  -Pt already has flexeril prescribed  -Follow up with PCP if symptoms lasting longer than expected  -RTC or ER if new/worsening symptoms  -motrin 800mg  script given 20 count    Yetta Glassman, FNP  [facility]  03/17/2017  11:34 AM

## 2017-03-20 ENCOUNTER — Telehealth (INDEPENDENT_AMBULATORY_CARE_PROVIDER_SITE_OTHER): Payer: Self-pay

## 2017-03-20 NOTE — Telephone Encounter (Addendum)
Courtesy call performed. Spoke with patient she is feeling better since her visit. No questions or concerns at this time

## 2017-03-24 ENCOUNTER — Ambulatory Visit (INDEPENDENT_AMBULATORY_CARE_PROVIDER_SITE_OTHER): Payer: Medicaid Other | Admitting: Otolaryngology

## 2017-03-24 ENCOUNTER — Encounter (INDEPENDENT_AMBULATORY_CARE_PROVIDER_SITE_OTHER): Payer: Self-pay | Admitting: Otolaryngology

## 2017-03-24 ENCOUNTER — Ambulatory Visit (INDEPENDENT_AMBULATORY_CARE_PROVIDER_SITE_OTHER): Payer: Medicaid Other | Admitting: Audiologist

## 2017-03-24 VITALS — BP 100/64 | HR 60 | Temp 96.8°F | Ht 63.0 in | Wt 144.0 lb

## 2017-03-24 DIAGNOSIS — H9042 Sensorineural hearing loss, unilateral, left ear, with unrestricted hearing on the contralateral side: Secondary | ICD-10-CM

## 2017-03-24 DIAGNOSIS — H9122 Sudden idiopathic hearing loss, left ear: Principal | ICD-10-CM

## 2017-03-24 DIAGNOSIS — R42 Dizziness and giddiness: Secondary | ICD-10-CM

## 2017-03-24 DIAGNOSIS — IMO0001 Reserved for inherently not codable concepts without codable children: Secondary | ICD-10-CM

## 2017-03-24 DIAGNOSIS — Z6825 Body mass index (BMI) 25.0-25.9, adult: Secondary | ICD-10-CM

## 2017-03-24 DIAGNOSIS — F17210 Nicotine dependence, cigarettes, uncomplicated: Secondary | ICD-10-CM

## 2017-03-24 DIAGNOSIS — H9312 Tinnitus, left ear: Secondary | ICD-10-CM

## 2017-03-24 NOTE — Procedures (Signed)
Procedure: Dix-Hallpike test    A Dix-Hallpike test was performed in clinic today. The patient was placed on the examining table in the seated position. The patient's head was turned to theleft and moved to the supine position with the following findings: subjective dizziness:no; rotary nystagmus: no. This test was repeated with the head turned to the right with the following findings: subjective dizziness: no; rotary nystagmus: no. Negative for BPPV.     I saw and examined the patient.  I formulated the plan with the PA. I reviewed the PA's note.  I agree with the findings and plan of care as documented in the PA's note.  Any exceptions/additions are edited/noted.      Olan Kurek A. Chelby Salata, MD  Assistant Professor  Otolaryngology-Head and Neck Surgery  [place]

## 2017-03-24 NOTE — H&P (Signed)
PATIENT NAME:  Lindsay Mccullough  MRN:  [id number]  DOB:  [birth year]  DATE OF SERVICE: 03/24/2017    Chief Complaint:  Ear Problem(s): Pt has hearing loss from left ear for two months. Pt reports hearing a pulse in her ear as well as a "fuzzy" noise. Pt reports mild pain in left ear her pain level is 5/10. Pt reports having vertigo for the past 2 mos. Pt reports taking meclizine 1 mo ago but has not taken it since.     HPI:  Lindsay Mccullough is a 31 y.o. female who presents for evaluation of left hearing loss, tinnitus, and dizziness. Patient reports a 2.5 month history of left hearing decline and tinnitus described as "static" and "thumping".   Patient states she woke 1 day which he could not hear her daughter out of her left ear with associated ear clogging/pressure and thumping sound.  Patient experienced spinning dizziness lasting 10-20 minutes intermittently which did persist through the following week.   Patient continues to experience intermittent episodes of spinning dizziness which is triggered by standing up and moving her head quickly to the left and right. Patient denies history ear/head trauma, previous illness and ear surgery. Patient does report frequent ear infections.  Patient reports family history of mother with left hearing loss and vertigo.  Patient denies known family history of Meniere's disease. Patient has seen the ED and urgent care for these symptoms treated with meclizine, antibiotic and steroids for the ear symptoms without improvement.   Patient/meclizine use was 1 month ago. Previous audiogram in 2016 revealed borderline WNL left hearing and WNL right. Patient denies associated lasting left ear pain, observed drainage and all right ear symptoms. Patient states that left hearing has not improved during this time. No other complaints.    Past Medical History:  Past Medical History:   Diagnosis Date   . Anxiety    . Asthma     controlled with prn Inhaler   . Back problem     chronic lower back pain      . Blood in stool 11/16/2016   . Chronic pain     chronic lower back pain   . Cigarette smoker    . Depression    . Dysplasia of cervix    . Elevated d-dimer     Hx of   . H/O seasonal allergies    . Hx of bronchitis most recent 05/2016   . Left shoulder pain    . Migraine    . 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 Margo Aye evaluated neck 11/17/16, occ. neck pain   . Nephrolithiasis    . Panic attack    . Shortness of breath     Recenty with SOB and left shoulder blader pain-has to sit up to breathe    . Transfusion history 2013    with childbirth     Past Surgical History:  Past Surgical History:   Procedure Laterality Date   . HX DILATION AND CURETTAGE  2006    sec. to miscarriage   . HX LITHOTRIPSY  10/2016     Family History:  Family Medical History:     Problem Relation (Age of Onset)    Allergic rhinitis Brother    Cirrhosis Father    Colon Cancer Other    Congestive Heart Failure Mother    Diabetes Paternal Grandmother    Hearing Loss Mother  Heart Attack Mother    Heart Disease Mother    High Cholesterol Mother, Father    Hypertension Mother, Father, Brother        Social History:  Social History     Tobacco Use   Smoking Status Current Every Day Smoker   . Packs/day: 0.25   . Years: 17.00   . Pack years: 4.25   . Types: Cigarettes   Smokeless Tobacco Never Used     Social History     Substance and Sexual Activity   Alcohol Use No   . Alcohol/week: 0.0 oz     Social History     Occupational History   . Not on file       Medications:  Outpatient Medications Marked as Taking for the 03/24/17 encounter (Office Visit) with Arnold Long, MD   Medication Sig   . cyclobenzaprine (FLEXERIL) 10 mg Oral Tablet 10 mg Three times a day    . fluticasone (FLONASE) 50 mcg/actuation Nasal Spray, Suspension 1 Spray by Each Nostril route Once a day   . gabapentin (NEURONTIN) 100 mg Oral Capsule Take 100 mg by mouth Three times a day   .  MULTIVITS,CA,MINERALS/IRON/FA (ONE-A-DAY WOMENS FORMULA ORAL) Take 1 Tab by mouth Once a day    . PARoxetine (PAXIL) 10 mg Oral Tablet Take 10 mg by mouth Every morning       Allergies:  Allergies   Allergen Reactions   . Amoxicillin Anaphylaxis   . Dilaudid [Hydromorphone] Shortness of Breath   . Spironolactone Rash and Hives/ Urticaria   . Iodine      Blisters on left arm   . Penicillins Hives/ Urticaria       Review of Systems:  Ear/Nose/Mouth/Throat: left hearing loss with tinnitus and dizziness  All other systems reviewed and found to be negative.    Physical Exam:  Blood pressure 100/64, pulse 60, temperature 36 C (96.8 F), temperature source Thermal Scan, height 1.6 m (5\' 3" ), weight 65.3 kg (144 lb), not currently breastfeeding.  Body mass index is 25.51 kg/m.  General Appearance: Pleasant, cooperative, healthy, and in no acute distress.  Eyes: Conjunctivae/corneas clear, PERRLA, EOM's intact.  Head and Face: Normocephalic, atraumatic.  Face symmetric, no obvious lesions.   Pinnae: Normal shape and position.   External auditory canals:  Patent without inflammation or drainage.  Tympanic membranes: Bilateral: Intact, translucent, midposition, middle ear aerated.  Nose:  External pyramid midline. Septum roughly midline. Mucosa normal. No purulence, polyps, or crusts.   Oral Cavity/Oropharynx: No mucosal lesions, masses, or pharyngeal asymmetry.  Tonsils: 2-3+ bilateral, no erythema or exudates.  Neck:  No palpable thyroid, salivary gland, or neck masses.  Heme/Lymph:  No cervical adenopathy.  Cardiovascular:  Good perfusion of upper extremities.  No cyanosis of the hands or fingers.  Lungs: No apparent stridorous breathing. No acute distress.  Skin: Skin warm and dry.  Neurologic: Cranial nerves:  grossly intact.  Psychiatric:  Alert and oriented x 3.    Procedure: Dix-Hallpike test    A Dix-Hallpike test was performed in clinic today. The patient was placed on the examining table in the seated position.  The patient's head was turned to theleft and moved to the supine position with the following findings: subjective dizziness:no; rotary nystagmus: no. This test was repeated with the head turned to the right with the following findings: subjective dizziness: no; rotary nystagmus: no. Negative for BPPV.    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   IMPRESSION: Left HF SNHL. Decline noted since 2016 audiogram.   Alwyn RenLindsay Bruce, Au.D., CCC-A, FAAA  Clinical Audiologist  03/24/2017 09:15         Assessment:    ICD-10-CM    1. Sudden left hearing loss H91.22 MRI IAC WO/W IV CONTRAST   2. 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 Left-sided tinnitus H93.12    4. Dizziness, nonspecific R42 Dix Halpike[[id number]]     MRI IAC WO/W IV CONTRAST       Plan: MRI IACs ordered for evaluation of patient's sudden left hearing loss, tinnitus and dizziness without signs of effusion/infection. Negative Dix-Hallpike. Follow up with Dr. Candelaria CelesteMakary after imaging.     Orders Placed This Encounter   . Dix Halpike[[id number]]   . MRI IAC WO/W IV CONTRAST     Return for Dr. Candelaria CelesteMakary following MRI IAC.    Patient was seen by Dr. Candelaria CelesteMakary during visit.    Sandi CarneAngela Ettinger, PA-C  Supervising physician: Alric SetonSohrab Shahab, MD  Supervising physician: Arnold Longhadi Quenna Doepke, MD  [facility]    I saw and examined the patient.  I formulated the plan with the PA. I reviewed the PA's note.  I agree with the findings and plan of care as documented in the PA's note.  Any exceptions/additions are edited/noted.      Omran Keelin A. Candelaria CelesteMakary, MD  Assistant Professor  Otolaryngology-Head and Neck Surgery  [place]

## 2017-03-24 NOTE — Procedures (Signed)
Patient: Lindsay Mccullough  MR Number: [id number]  Date of Birth: 12/15/1985  Date of Service: 03/24/2017    SUBJECTIVE:  This is a 32 y.o. female who presents today for an audiometric evaluation for left hearing loss, tinnitus, and dizziness. Patient reported a 2.5 month history of left hearing decline and tinnitus described as "static" and "thumping". Patient noted her head feels as if it is spinning. This is triggered by standing up and moving her head quickly to the left and right. Reported spinning sensations last 15 minutes. Patient was referred by her PCP. Patient has seen the ED and urgent care for these symptoms without improvement. Patient has tried Meclizine for the dizziness without improvement and antibiotic and steroids for the ear symptoms without improvement. Previous audiogram in 2016 revealed borderline WNL left hearing and WNL right. No associated ear pain or drainage. Patient denied all right ear symptoms.    ASSESSMENT:  Comprehensive audiometric evaluation and tympanometric testing revealed  Right: WNL w/ excellent WRS and Type A tympanogram  Left: Mild HF SNHL w/ excellent WRS and Type A tympanogram.     IMPRESSION:  Left HF SNHL. Decline noted since 2016 audiogram.     RECOMMENDATIONS: ENT follow up, repeat audiogram as needed.    Alwyn RenLindsay Azyiah Bo, Au.D., CCC-A, FAAA  Clinical Audiologist  03/24/2017 09:15

## 2017-03-24 NOTE — Patient Instructions (Signed)
Ordered MRI ears for evaluation of left hearing loss and dizziness.  Follow up with Dr. Candelaria CelesteMakary after imaging.

## 2017-04-07 ENCOUNTER — Ambulatory Visit (HOSPITAL_BASED_OUTPATIENT_CLINIC_OR_DEPARTMENT_OTHER)
Admission: RE | Admit: 2017-04-07 | Discharge: 2017-04-07 | Disposition: A | Discharge status: Home / Self Care | Payer: Medicaid Other | Source: Ambulatory Visit | Attending: Physician Assistant | Admitting: Physician Assistant

## 2017-04-07 DIAGNOSIS — H9122 Sudden idiopathic hearing loss, left ear: Secondary | ICD-10-CM

## 2017-04-07 DIAGNOSIS — R42 Dizziness and giddiness: Secondary | ICD-10-CM | POA: Insufficient documentation

## 2017-04-07 DIAGNOSIS — IMO0001 Reserved for inherently not codable concepts without codable children: Secondary | ICD-10-CM

## 2017-04-07 DIAGNOSIS — H9042 Sensorineural hearing loss, unilateral, left ear, with unrestricted hearing on the contralateral side: Secondary | ICD-10-CM

## 2017-04-07 MED ORDER — GADOTERIDOL 279.3 MG/ML INTRAVENOUS SOLUTION
12.00 mL | INTRAVENOUS | Status: AC
Start: 2017-04-07 — End: 2017-04-07
  Administered 2017-04-07: 12 mL via INTRAVENOUS
  Filled 2017-04-07: qty 15

## 2017-04-13 ENCOUNTER — Encounter (INDEPENDENT_AMBULATORY_CARE_PROVIDER_SITE_OTHER): Payer: Self-pay | Admitting: Otolaryngology

## 2017-04-16 ENCOUNTER — Ambulatory Visit (INDEPENDENT_AMBULATORY_CARE_PROVIDER_SITE_OTHER): Payer: No Typology Code available for payment source | Admitting: Family Medicine

## 2017-04-16 ENCOUNTER — Other Ambulatory Visit
Admission: RE | Admit: 2017-04-16 | Discharge: 2017-04-16 | Disposition: A | Discharge status: Home / Self Care | Payer: No Typology Code available for payment source | Source: Ambulatory Visit | Attending: Family Medicine | Admitting: Family Medicine

## 2017-04-16 ENCOUNTER — Encounter (INDEPENDENT_AMBULATORY_CARE_PROVIDER_SITE_OTHER): Payer: Self-pay | Admitting: Family Medicine

## 2017-04-16 ENCOUNTER — Ambulatory Visit (HOSPITAL_BASED_OUTPATIENT_CLINIC_OR_DEPARTMENT_OTHER): Payer: Medicaid Other | Attending: Family Medicine

## 2017-04-16 VITALS — BP 118/72 | HR 112 | Temp 98.3°F | Resp 16 | Ht 61.0 in | Wt 144.8 lb

## 2017-04-16 DIAGNOSIS — R231 Pallor: Principal | ICD-10-CM | POA: Insufficient documentation

## 2017-04-16 DIAGNOSIS — F5104 Psychophysiologic insomnia: Secondary | ICD-10-CM

## 2017-04-16 DIAGNOSIS — R102 Pelvic and perineal pain: Secondary | ICD-10-CM

## 2017-04-16 DIAGNOSIS — F5101 Primary insomnia: Secondary | ICD-10-CM | POA: Insufficient documentation

## 2017-04-16 LAB — VH POCT URINALYSIS (DIPSTICK)
Bilirubin, UA POCT: NEGATIVE
Blood, UA POCT: NEGATIVE
Glucose, UA POCT: NEGATIVE mg/dL
Ketones, UA POCT: NEGATIVE mg/dL
Nitrite, UA POCT: NEGATIVE
POCT Leukocytes, UA: NEGATIVE
POCT Spec Gravity, UA: 1.005 (ref 1.001–1.035)
POCT pH, UA: 8 (ref 5–8)
Protein, UA POCT: NEGATIVE mg/dL
Urobilinogen, UA: 0.2 mg/dL

## 2017-04-16 LAB — COMPREHENSIVE METABOLIC PROFILE - BMC/JMC ONLY
ALBUMIN/GLOBULIN RATIO: 1.3 (ref 0.8–2.0)
ALBUMIN: 4.1 g/dL (ref 3.5–5.0)
ALKALINE PHOSPHATASE: 88 U/L (ref 38–126)
ALT (SGPT): 19 U/L (ref 14–54)
ANION GAP: 7 mmol/L (ref 3–11)
AST (SGOT): 24 U/L (ref 15–41)
BILIRUBIN TOTAL: 0.5 mg/dL (ref 0.3–1.2)
BUN/CREA RATIO: 9 (ref 6–22)
BUN: 6 mg/dL (ref 6–20)
CALCIUM: 9.2 mg/dL (ref 8.6–10.3)
CHLORIDE: 101 mmol/L (ref 101–111)
CO2 TOTAL: 28 mmol/L (ref 22–32)
CO2 TOTAL: 28 mmol/L (ref 22–32)
CREATININE: 0.66 mg/dL (ref 0.44–1.00)
ESTIMATED GFR: >60 mL/min/1.73mˆ2 (ref >60)
GLUCOSE: 101 mg/dL (ref 70–110)
POTASSIUM: 3.7 mmol/L (ref 3.4–5.1)
PROTEIN TOTAL: 7.2 g/dL (ref 6.4–8.3)
SODIUM: 136 mmol/L (ref 136–145)

## 2017-04-16 LAB — CBC WITH DIFF
BASOPHIL #: 0 x10ˆ3/uL (ref 0.00–0.10)
BASOPHIL %: 1 % (ref 0–3)
EOSINOPHIL #: 0 x10ˆ3/uL (ref 0.00–0.50)
EOSINOPHIL %: 1 % (ref 0–5)
HCT: 41 % (ref 36.0–45.0)
HGB: 13.9 g/dL (ref 12.0–15.5)
LYMPHOCYTE #: 2.5 x10ˆ3/uL (ref 1.00–4.80)
LYMPHOCYTE %: 30 % (ref 15–43)
MCH: 33.6 pg — ABNORMAL HIGH (ref 27.5–33.2)
MCHC: 33.9 g/dL (ref 32.0–36.0)
MCV: 98.9 fL — ABNORMAL HIGH (ref 82.0–97.0)
MONOCYTE #: 0.5 x10ˆ3/uL (ref 0.20–0.90)
MONOCYTE %: 5 % (ref 5–12)
MPV: 7.4 fL (ref 7.4–10.5)
NEUTROPHIL #: 5.5 x10ˆ3/uL (ref 1.50–6.50)
NEUTROPHIL %: 64 % (ref 43–76)
PLATELETS: 309 x10ˆ3/uL (ref 150–450)
RBC: 4.15 10*6/uL (ref 4.00–5.10)
RDW: 13.3 % (ref 11.0–16.0)
WBC: 8.5 x10ˆ3/uL (ref 4.0–11.0)

## 2017-04-16 LAB — FERRITIN: FERRITIN: 30 ng/mL (ref 11–307)

## 2017-04-16 MED ORDER — DOXEPIN HCL 50 MG PO CAPS
50.00 mg | ORAL_CAPSULE | Freq: Every evening | ORAL | 0 refills | Status: DC
Start: 2017-04-16 — End: 2017-10-19

## 2017-04-16 NOTE — Progress Notes (Signed)
Subjective:    Patient ID: Regina Adams is a 32 y.o. female.    She is here today because her friends states that she has jaundice. No abdominal pain no nausea no vomiting. She sees psychiatry and was placed on a Bipolar medication she forgot the name on top of her Paxil. She did not like the way she feels while on it so she stopped taking the medication states she is not bipolar. She states she is fine.   She has been lacking in sleep as well even if she tires herself at night she cannot sleep.       Past Medical History:   Diagnosis Date   . Anemia    . Anxiety    . Asthma without status asthmaticus    . Depression    . Hormone disorder    . Migraines    . 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 KIDNEY STONE SURGERY  11/20/2016    SOUND WAVE BLAST     Family History   Problem Relation Age of Onset   . Hyperlipidemia Mother    . Hypertension Mother    . Heart attack Mother    . Arthritis Mother    . Jaundice Mother    . Cancer Father    . Hypertension Father    . Hyperlipidemia Father    . Diabetes Father    . Hypertension Maternal Uncle    . Diabetes Maternal Grandmother    . Jaundice Maternal Aunt      Social History     Social History   . Marital status: Divorced     Spouse name: N/A   . Number of children: N/A   . Years of education: N/A     Occupational History   . Not on file.     Social History Main Topics   . Smoking status: Current Every Day Smoker     Types: Cigarettes   . Smokeless tobacco: Former Neurosurgeon     Quit date: 09/19/2013      Comment: 2 per day   . Alcohol use No   . Drug use: No   . Sexual activity: No     Other Topics Concern   . Not on file     Social History Narrative   . No narrative on file     The following portions of the patient's history were reviewed and updated as appropriate: allergies, current medications, past family history, past medical history, past social history, past surgical history and problem  list.    Review of Systems   Constitutional: Positive for fatigue. Negative for fever and unexpected weight change.   HENT: Negative for congestion.    Respiratory: Negative for cough and shortness of breath.    Cardiovascular: Negative for chest pain and palpitations.   Gastrointestinal: Positive for abdominal pain (suprapubic mild discomfort). Negative for diarrhea, nausea and vomiting.   Neurological: Negative for dizziness.   Hematological: Negative for adenopathy.           Objective:    Physical Exam   Constitutional: She is oriented to person, place, and time. She appears well-developed and well-nourished. No distress.   HENT:   Head: Normocephalic and atraumatic.   Mouth/Throat: No oropharyngeal exudate.   Eyes: EOM are normal. No scleral icterus.   Anicteric sclerae, pale conjunctiva   Neck: Neck supple.  Cardiovascular: Normal heart sounds.    Pulmonary/Chest: Breath sounds normal.   Abdominal: There is tenderness (suprapubic mild discomfort).   Lymphadenopathy:     She has no cervical adenopathy.   Neurological: She is alert and oriented to person, place, and time.   Skin: Skin is warm and dry.   Psychiatric: She has a normal mood and affect.   Nursing note and vitals reviewed.          Assessment:       1. Pallor    2. Suprapubic pain    3. Psychophysiological insomnia          Plan:       1. Pallor - get CBC/CMP.    2. Suprapubic pain - get urine dip unremarkable. Send for culture.   3. Psychophysiological insomnia - trial of Doxepin. FU with psych for treatment of anxiety

## 2017-04-19 ENCOUNTER — Telehealth (INDEPENDENT_AMBULATORY_CARE_PROVIDER_SITE_OTHER): Payer: Self-pay

## 2017-04-19 NOTE — Telephone Encounter (Signed)
Voicemail left for patient to return my call regarding her results.  Her liver enzymes are normal and she had no infection.

## 2017-04-26 ENCOUNTER — Telehealth (INDEPENDENT_AMBULATORY_CARE_PROVIDER_SITE_OTHER): Payer: Self-pay | Admitting: Family Medicine

## 2017-04-26 NOTE — Telephone Encounter (Signed)
Patient would like a call regarding the results of the labs that she completed at [facility].

## 2017-04-28 ENCOUNTER — Ambulatory Visit (INDEPENDENT_AMBULATORY_CARE_PROVIDER_SITE_OTHER): Payer: Medicaid Other | Admitting: Otolaryngology

## 2017-04-28 VITALS — BP 102/78 | HR 92 | Temp 96.4°F | Ht 63.0 in | Wt 144.0 lb

## 2017-04-28 DIAGNOSIS — H9042 Sensorineural hearing loss, unilateral, left ear, with unrestricted hearing on the contralateral side: Secondary | ICD-10-CM

## 2017-04-28 DIAGNOSIS — H905 Unspecified sensorineural hearing loss: Secondary | ICD-10-CM

## 2017-04-28 NOTE — Progress Notes (Signed)
[facility]  [facility]  [place], [place] [id number]            [facility]    NAME:  Lindsay Mccullough  MRN:  [id number]  DOB:  04/22/1985  DOS:  04/28/2017    Subjective  Lindsay Mccullough is a 32 y.o. female who presents for follow up for left sudden sensorineural hearing loss and tinnitus.  She had MRI IAC performed last week and now is come back for follow-up.  She denies any changes in her symptoms.    Objective    Physical Exam  Vitals:    04/28/17 0931   BP: 102/78   Pulse: 92   Temp: 35.8 C (96.4 F)   TempSrc: Thermal Scan   Weight: 65.3 kg (144 lb)   Height: 1.6 m (5\' 3" )   BMI: 25.56         General Appearance: Pleasant, cooperative, healthy, and in no acute distress.  Eyes: Conjunctivae/corneas clear,   Head and Face: Normocephalic, atraumatic.  Face symmetric, no obvious lesions.   Pinnae: Normal shape and position.   External auditory canals:  Patent without inflammation.  Tympanic membranes:  Intact, translucent, midposition, middle ear aerated.  Psychiatric:  Alert and oriented x 3.    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 Candelaria CelesteMakary, MD  Assistant Professor  Otolaryngology-Head and Neck Surgery  [place]

## 2017-04-29 ENCOUNTER — Other Ambulatory Visit (INDEPENDENT_AMBULATORY_CARE_PROVIDER_SITE_OTHER): Payer: Self-pay | Admitting: Family Medicine

## 2017-04-30 ENCOUNTER — Ambulatory Visit (INDEPENDENT_AMBULATORY_CARE_PROVIDER_SITE_OTHER): Payer: No Typology Code available for payment source | Admitting: Family Medicine

## 2017-05-12 ENCOUNTER — Telehealth (INDEPENDENT_AMBULATORY_CARE_PROVIDER_SITE_OTHER): Payer: Self-pay | Admitting: Family Medicine

## 2017-05-12 NOTE — Telephone Encounter (Signed)
Patient called about the results of her labs again today.

## 2017-05-12 NOTE — Telephone Encounter (Signed)
Labs fine.  Take iron pills OTC.

## 2017-05-12 NOTE — Telephone Encounter (Signed)
Tried to call patient with for the lab results but the phone just rang no answering machine.

## 2017-05-12 NOTE — Telephone Encounter (Signed)
Urine culture did not show frank infection.

## 2017-05-13 ENCOUNTER — Other Ambulatory Visit
Admission: RE | Admit: 2017-05-13 | Discharge: 2017-05-13 | Disposition: A | Discharge status: Home / Self Care | Payer: No Typology Code available for payment source | Source: Ambulatory Visit | Attending: Family | Admitting: Family

## 2017-05-13 ENCOUNTER — Ambulatory Visit (INDEPENDENT_AMBULATORY_CARE_PROVIDER_SITE_OTHER): Payer: No Typology Code available for payment source | Admitting: Family

## 2017-05-13 ENCOUNTER — Encounter (INDEPENDENT_AMBULATORY_CARE_PROVIDER_SITE_OTHER): Payer: Self-pay | Admitting: Family

## 2017-05-13 VITALS — BP 127/92 | HR 142 | Temp 97.9°F | Resp 18 | Ht 63.0 in | Wt 142.0 lb

## 2017-05-13 DIAGNOSIS — R8290 Unspecified abnormal findings in urine: Secondary | ICD-10-CM

## 2017-05-13 DIAGNOSIS — R110 Nausea: Secondary | ICD-10-CM

## 2017-05-13 DIAGNOSIS — N926 Irregular menstruation, unspecified: Secondary | ICD-10-CM

## 2017-05-13 DIAGNOSIS — N3000 Acute cystitis without hematuria: Secondary | ICD-10-CM

## 2017-05-13 LAB — VH POCT UA-AUTOMATED(UCC)
Blood, UA POCT: NEGATIVE
Glucose, UA POCT: NEGATIVE
Nitrite, UA POCT: NEGATIVE
PH, UA POCT: 6 (ref 4.6–8)
Specific Gravity, UA POCT: 1.02 mg/dL (ref 1.001–1.035)
Urobilinogen, UA POCT: 0.2 mg/dL

## 2017-05-13 LAB — POCT PREGNANCY TEST, URINE HCG: POCT Pregnancy HCG Test, UR: NEGATIVE

## 2017-05-13 MED ORDER — NITROFURANTOIN MONOHYD MACRO 100 MG PO CAPS
100.00 mg | ORAL_CAPSULE | Freq: Two times a day (BID) | ORAL | 0 refills | Status: AC
Start: 2017-05-13 — End: 2017-05-16

## 2017-05-13 NOTE — Progress Notes (Signed)
Subjective:    Patient ID: Regina Adams is a 32 y.o. female.    Pregnancy Problem   This is a new (pt has missed pd since january has taken home test but all negatvie wants to get checked for pregnancy today) problem. The current episode started more than 1 month ago. Associated symptoms include nausea. Pertinent negatives include no abdominal pain, anorexia, arthralgias, change in bowel habit, chest pain, chills, congestion, coughing, diaphoresis, fatigue, fever, headaches, joint swelling, myalgias, neck pain, numbness, rash, sore throat, swollen glands, urinary symptoms, vertigo, visual change, vomiting or weakness.       The following portions of the patient's history were reviewed and updated as appropriate: allergies, current medications, past family history, past medical history, past social history, past surgical history and problem list.    Review of Systems   Constitutional: Negative for activity change, appetite change, chills, diaphoresis, fatigue, fever and unexpected weight change.   HENT: Negative for congestion, dental problem, drooling, ear discharge, ear pain, facial swelling, hearing loss, mouth sores, nosebleeds, postnasal drip, rhinorrhea, sinus pain, sinus pressure, sneezing, sore throat, tinnitus, trouble swallowing and voice change.    Eyes: Negative for pain, discharge, redness and itching.   Respiratory: Negative for cough.    Cardiovascular: Negative for chest pain and leg swelling.   Gastrointestinal: Positive for nausea. Negative for abdominal distention, abdominal pain, anal bleeding, anorexia, blood in stool, change in bowel habit, constipation, diarrhea, rectal pain and vomiting.   Endocrine: Negative for cold intolerance and heat intolerance.   Genitourinary: Positive for menstrual problem. Negative for decreased urine volume, difficulty urinating, dyspareunia, dysuria, flank pain, frequency, genital sores, hematuria, pelvic pain, urgency, vaginal bleeding, vaginal discharge  and vaginal pain.   Musculoskeletal: Negative for arthralgias, back pain, joint swelling, myalgias and neck pain.   Skin: Negative for color change, pallor, rash and wound.   Allergic/Immunologic: Negative for environmental allergies and food allergies.   Neurological: Negative for vertigo, weakness, numbness and headaches.         Objective:    BP (!) 127/92   Pulse (!) 142   Temp 97.9 F (36.6 C) (Oral)   Resp 18   Ht 1.6 m (5\' 3" )   Wt 64.4 kg (142 lb)   LMP 03/17/2017 (Approximate)   BMI 25.15 kg/m     Physical Exam   Constitutional: She is oriented to person, place, and time. She appears well-developed and well-nourished.   HENT:   Head: Normocephalic.   Right Ear: External ear normal.   Left Ear: External ear normal.   Nose: Nose normal.   Mouth/Throat: Oropharynx is clear and moist.   Eyes: Conjunctivae are normal.   Neck: Normal range of motion.   Cardiovascular: Normal rate, regular rhythm and normal heart sounds.    Pulmonary/Chest: Effort normal and breath sounds normal.   Abdominal: Soft. Bowel sounds are normal. She exhibits no distension and no mass. There is no tenderness. There is no rebound and no guarding. No hernia.   Musculoskeletal: Normal range of motion.   Neurological: She is alert and oriented to person, place, and time.   Skin: Skin is warm and dry.   Psychiatric: She has a normal mood and affect. Her behavior is normal. Judgment and thought content normal.         Assessment and Plan:       Regina Adams was seen today for possibly pregnant.    Diagnoses and all orders for this visit:    Missed period  -  POCT Pregnancy Test, Urine HCG    Nausea  -     VH POCT UA AUTO    Acute cystitis without hematuria  -     nitrofurantoin, macrocrystal-monohydrate, (MACROBID) 100 MG capsule; Take 1 capsule (100 mg total) by mouth 2 (two) times daily.for 3 days    Abnormal urinalysis  -     Urine Culture; Future      Results     Procedure Component Value Units Date/Time    POCT Pregnancy Test,  Urine HCG [[id number]]  (Normal) Collected:  05/13/17 0930    Specimen:  Urine Updated:  05/13/17 0944     POCT QC Pass     POCT Pregnancy HCG Test, UR Negative     Comment: Negative Value is Normal in Healthy Males or Healthy non-pregnant Females    VH POCT UA AUTO [[id number]]  (Abnormal) Collected:  05/13/17 0930    Specimen:  Urine Updated:  05/13/17 0944     Color UA POCT Amber     Clarity UA POCT Cloudy     Glucose, UA POCT Negative     Bilirubin, UA POCT Small (A)     Ketones, UA POCT Trace (A) mg/dL      Specific Gravity, UA POCT 1.020 mg/dL      Blood, UA POCT  Negative     PH, UA POCT 6.0     Protein, UA POCT Trace (A) mg/dL      Urobilinogen, UA POCT 0.2 mg/dL      Nitrite, UA POCT Negative     Leukocytes, UA POCT Trace  (A)        -discussed negative pregnancy results with pt  Discussed UA results: +  -Discussed antibiotic treatment with macrobid  -will change antibiotic if needed based on C&S  -Discussed RTC or report to ER if new/worsening symptoms such as: intractable fever, nausea, vomiting, severe flank pain, sweats  -Follow up with PCP if symptoms lasting longer than expected  -Patient agreed to plan  Regina Glassman, FNP  [facility]  05/13/2017  9:56 AM

## 2017-05-13 NOTE — Telephone Encounter (Signed)
Tried to reach patient regarding lab results. No answering machine on the phone and phone just rang.

## 2017-05-13 NOTE — Patient Instructions (Signed)
Bladder Infection,Female (Adult)    Urine is normally doesn't have any bacteria in it. But bacteria can get into the urinary tract from the skin around the rectum. Or they can travel in the blood from elsewhere in the body. Once they are in your urinary tract, they can cause infection in the urethra (urethritis), the bladder (cystitis), or the kidneys (pyelonephritis).  The most common place for an infection is in the bladder. This is called a bladder infection. This is one of the most common infections in women. Most bladder infections are easily treated. They are not serious unless the infection spreads to the kidney.  The phrases "bladder infection," "UTI," and "cystitis" are often used to describe the same thing. But they are not always the same. Cystitis is an inflammation of the bladder. Themost common cause of cystitis is an infection.  Symptoms  The infection causes inflammation in the urethra and bladder. This causes many of the symptoms. The most common symptoms of a bladder infection are:   Pain or burning when urinating   Having to urinate more often than usual   Urgent need to urinate   Only a small amount of urine comes out   Blood in urine   Abdominal discomfort. This is usually in the lower abdomen above the pubic bone.   Cloudy urine   Strong- or bad-smelling urine   Unable to urinate (urinary retention)   Unable to hold urine in (urinary incontinence)   Fever   Loss of appetite   Confusion (in older adults)  Causes  Bladder infections are not contagious. You can't get one from someone else, from a toilet seat, or from sharing a bath.  The most common cause of bladder infections is bacteria from the bowels. The bacteria get onto the skin around the opening of the urethra. From there, they can get into the urine and travel up to the bladder, causing inflammation and infection. This usually happens because of:   Wiping improperly after urinating. Always wipe from front to  back.   Bowel incontinence   Pregnancy   Procedures such as having a catheter inserted   Older age   Not emptying your bladder. This can allow bacteria a chance to grow in your urine.   Dehydration   Constipation   Sex   Use of a diaphragm for birth control  Treatment  Bladder infections are diagnosed by a urine test. They are treated with antibiotics and usuallyclear up quickly without complications. Treatment helps prevent a more serious kidney infection.  Medicines  Medicines can help in the treatment of a bladder infection:   Take antibiotics until they are used up, even if you feel better. It is important to finish them to make sure the infection has cleared.   You can use acetaminophen or ibuprofen for pain, fever, or discomfort, unless another medicine was prescribed. If you have chronic liver or kidney disease, talk with your healthcareprovider before usingthese medicines. Also talk with your provider if you've ever had a stomach ulcer or gastrointestinal bleeding, or are taking blood-thinner medicines.   If you are givenphenazopydridine to reduce burning with urination, it will cause your urine to become a bright orange color. This can stain clothing.  Care and prevention  These self-care steps can help prevent future infections:   Drink plenty of fluids to prevent dehydration and flush out your bladder. Do thisunless you must restrict fluids for other health reasons, or your doctor told you not to.     Proper cleaning after going to the bathroom is important. Wipe from front to back after using the toilet to prevent the spread of bacteria.   Urinate more often. Don't try to hold urine in for a long time.   Wear loose-fitting clothes and cotton underwear. Avoid tight-fitting pants.   Improve your diet and prevent constipation. Eat more fresh fruit and vegetables, andfiber, and less junk and fatty foods.   Avoid sex until your symptoms are gone.   Avoid caffeine, alcohol, and spicy  foods. These can irritate your bladder.   Urinate right after intercourse to flush out your bladder.   If you use birth control pills and have frequent bladder infections, discuss it with your doctor.  Follow-up care  Call your healthcare provider if all symptoms are not gone after 3 days of treatment. This is especially important if you have repeat infections.  If a culture was done, you will be told if your treatment needs to be changed. If directed, you can callto find out the results.  If X-rays were done, you will be told if the results will affect yourtreatment.  Call 911  Call 911 if any of the following occur:   Trouble breathing   Hard to wake up orconfusion   Fainting or loss of consciousness   Rapid heart rate  When to seek medical advice  Call your healthcare provider right away if any of these occur:   Fever of 100.4F (38.0C) or higher, or as directed by your healthcare provider   Symptoms are not betterby the third day of treatment   Back or belly (abdominal) pain that gets worse   Repeated vomiting, or unable to keep medicine down   Weakness or dizziness   Vaginal discharge   Pain, redness, or swelling in the outer vaginal area (labia)  Date Last Reviewed: 12/01/2014   2000-2016 [employer] [address], [place] [address]. All rights reserved. This information is not intended as a substitute for professional medical care. Always follow your healthcare professional's instructions.

## 2017-05-13 NOTE — Progress Notes (Signed)
URINE CULTURE collected from patient by FF. This staff member prepared specimen, NO CHARGES PER INSURANCE and placed in courier box for lab courier to pick up.

## 2017-05-15 ENCOUNTER — Telehealth (INDEPENDENT_AMBULATORY_CARE_PROVIDER_SITE_OTHER): Payer: Self-pay

## 2017-05-15 NOTE — Telephone Encounter (Signed)
-----   Message from Katherine M Hart, NP sent at 05/15/2017 12:00 PM EDT -----  Please call patient, notify of results, and see if there is improvement.    The urine culture did not grow anything that would change the current treatment plan. If improving, complete the Macrobid.    If not improved, return to clinic.    If having fever, chills, nausea, vomiting, flank pain, they need to be seen immediately, either in the clinic or at the ED.

## 2017-05-15 NOTE — Telephone Encounter (Signed)
Attempted x2 to reach patient with results. Number busy both times. No other number available

## 2017-05-16 ENCOUNTER — Telehealth (INDEPENDENT_AMBULATORY_CARE_PROVIDER_SITE_OTHER): Payer: Self-pay

## 2017-05-16 NOTE — Telephone Encounter (Signed)
-----   Message from Carolin Sicks, NP sent at 05/15/2017 12:00 PM EDT -----  Please call patient, notify of results, and see if there is improvement.    The urine culture did not grow anything that would change the current treatment plan. If improving, complete the Macrobid.    If not improved, return to clinic.    If having fever, chills, nausea, vomiting, flank pain, they need to be seen immediately, either in the clinic or at the ED.

## 2017-05-16 NOTE — Telephone Encounter (Signed)
Attempted to reach this number x2 busy both times. Unable to reach patient.

## 2017-05-20 ENCOUNTER — Ambulatory Visit (INDEPENDENT_AMBULATORY_CARE_PROVIDER_SITE_OTHER): Payer: Medicaid Other | Admitting: Registered Nurse

## 2017-05-20 ENCOUNTER — Other Ambulatory Visit (HOSPITAL_BASED_OUTPATIENT_CLINIC_OR_DEPARTMENT_OTHER): Payer: Medicaid Other | Attending: Registered Nurse

## 2017-05-20 ENCOUNTER — Other Ambulatory Visit (HOSPITAL_BASED_OUTPATIENT_CLINIC_OR_DEPARTMENT_OTHER): Payer: Self-pay | Admitting: Registered Nurse

## 2017-05-20 ENCOUNTER — Encounter (INDEPENDENT_AMBULATORY_CARE_PROVIDER_SITE_OTHER): Payer: Self-pay | Admitting: Registered Nurse

## 2017-05-20 VITALS — BP 120/72 | HR 94 | Ht 63.0 in | Wt 142.0 lb

## 2017-05-20 DIAGNOSIS — Z01419 Encounter for gynecological examination (general) (routine) without abnormal findings: Secondary | ICD-10-CM

## 2017-05-20 DIAGNOSIS — Z202 Contact with and (suspected) exposure to infections with a predominantly sexual mode of transmission: Secondary | ICD-10-CM

## 2017-05-20 DIAGNOSIS — Z124 Encounter for screening for malignant neoplasm of cervix: Secondary | ICD-10-CM | POA: Insufficient documentation

## 2017-05-20 DIAGNOSIS — Z1151 Encounter for screening for human papillomavirus (HPV): Secondary | ICD-10-CM | POA: Insufficient documentation

## 2017-05-20 DIAGNOSIS — Z720 Tobacco use: Secondary | ICD-10-CM

## 2017-05-20 DIAGNOSIS — N643 Galactorrhea not associated with childbirth: Secondary | ICD-10-CM

## 2017-05-20 NOTE — Progress Notes (Signed)
[facility]  [address]  [place], [place]  [address]  [phone]      Name of Patient: Lindsay Mccullough  DOB: [birth year]  MRN: [id number]    Date of Service: 05/20/2017        HPI Comments:    32 y.o. [id number]  Chief Complaint   Patient presents with   . Annual Physical     last pap 01/13/2016- ascus- colpo cin 1             Menarche: 10  Interval:  Usually monthly  Duration: 6d    Flow: Normal   LMP: Jan 16th; and then bleeding x last 3 days  Sexually active: Yes  New Partner: no, same x 3 years  Contraception: none, wants another child soon  Prior OCP Use: Yes  Lifetime Sex Partners:"too many"  H/O STD: HPV  Last Pap: 01/2016 Ascus, + HR HPV; neg 16/18/45; Colpo 03/2016: CIN 1  H/O abnormal Pap: see above    Patient reports milky white nipple discharge, bilaterally since February. Notices in bra, spontaneously      Depression Screening:  Sleep- No change  Interest-no change  Guilt- None  Energy- good  Concentration- good  Appetite- o change from normal  Psychomotor- unremarkable  Suicidal- denied by patient  Sex Drive: normal    Takes medication for anxiety and depression: on meds x 3 months  Sees psychiatric provider    Denies any sexual or physical abuse.         REVIEW OF SYSTEMS :  Constitutional: Negative. Denies any fever, chills or fatigue   Skin: Negative.  Denies any rash or skin changes  HENT: Negative.  Denies any headaches, visual changes, swallowing issues  Cardiovascular: Negative.  Denies any chest pain or palpitations  Respiratory: Negative.  Denies any difficulty breathing or cough  Gastrointestinal: Negative.  + nausea, + occ constipation  Genitourinary:        See HPI   Musculoskeletal: Negative.   Neurological: Negative.    Psychiatric: Negative.      Patient's PMH/PSH/FH/SH/Meds/Allergies were all reviewed and noted in the EPIC chart on today's visit.    Past Medical History:   Diagnosis Date   . Anxiety    . Asthma     controlled with prn Inhaler   . Back  problem     chronic lower back pain   . Blood in stool 11/16/2016   . Chronic pain     chronic lower back pain   . Cigarette smoker    . Depression    . Dysplasia of cervix    . Elevated d-dimer     Hx of   . H/O seasonal allergies    . Hx of bronchitis most recent 05/2016   . Left shoulder pain    . Migraine    . 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 Margo Aye evaluated neck 11/17/16, occ. neck pain   . Nephrolithiasis    . Panic attack    . Shortness of breath     Recenty with SOB and left shoulder blader pain-has to sit up to breathe    . Transfusion history 2013    with childbirth         Past Surgical History:   Procedure Laterality Date   . HX DILATION AND CURETTAGE  2006    sec. to miscarriage   .  HX LITHOTRIPSY  10/2016         Family Medical History:     Problem Relation (Age of Onset)    Allergic rhinitis Brother    Cirrhosis Father    Colon Cancer Other    Congestive Heart Failure Mother    Diabetes Paternal Grandmother    Hearing Loss Mother    Heart Attack Mother    Heart Disease Mother    High Cholesterol Mother, Father    Hypertension Mother, Father, Brother            Current Outpatient Medications   Medication Sig Dispense Refill   . albuterol sulfate (PROVENTIL OR VENTOLIN OR PROAIR) 90 mcg/actuation Inhalation HFA Aerosol Inhaler Take 2 Puffs by inhalation Every 6 hours as needed      . ASPIRIN/ACETAMINOPHEN/CAFFEINE (EXCEDRIN MIGRAINE ORAL) Take 1 Tab by mouth Every 6 hours as needed      . cyclobenzaprine (FLEXERIL) 10 mg Oral Tablet 10 mg Three times a day   0   . Doxepin (SINEQUAN) 50 mg Oral Capsule Take 50 mg by mouth     . fluticasone (FLONASE) 50 mcg/actuation Nasal Spray, Suspension 1 Spray by Each Nostril route Once a day     . gabapentin (NEURONTIN) 100 mg Oral Capsule Take 100 mg by mouth Three times a day     . MULTIVITS,CA,MINERALS/IRON/FA (ONE-A-DAY WOMENS FORMULA ORAL) Take 1 Tab by mouth Once a day      . PARoxetine  (PAXIL) 10 mg Oral Tablet Take 10 mg by mouth Every morning       No current facility-administered medications for this visit.      Allergies   Allergen Reactions   . Amoxicillin Anaphylaxis   . Dilaudid [Hydromorphone] Shortness of Breath   . Spironolactone Rash and Hives/ Urticaria   . Iodine      Blisters on left arm   . Penicillins Hives/ Urticaria     Social History     Socioeconomic History   . Marital status: Divorced     Spouse name: Not on file   . Number of children: Not on file   . Years of education: Not on file   . Highest education level: Not on file   Occupational History   . Not on file   Social Needs   . Financial resource strain: Not on file   . Food insecurity:     Worry: Not on file     Inability: Not on file   . Transportation needs:     Medical: Not on file     Non-medical: Not on file   Tobacco Use   . Smoking status: Current Every Day Smoker     Packs/day: 0.25     Years: 17.00     Pack years: 4.25     Types: Cigarettes   . Smokeless tobacco: Never Used   Substance and Sexual Activity   . Alcohol use: No     Alcohol/week: 0.0 oz   . Drug use: No   . Sexual activity: Not on file   Lifestyle   . Physical activity:     Days per week: Not on file     Minutes per session: Not on file   . Stress: Not on file   Relationships   . Social connections:     Talks on phone: Not on file     Gets together: Not on file     Attends religious service: Not on file  Active member of club or organization: Not on file     Attends meetings of clubs or organizations: Not on file     Relationship status: Not on file   . Intimate partner violence:     Fear of current or ex partner: Not on file     Emotionally abused: Not on file     Physically abused: Not on file     Forced sexual activity: Not on file   Other Topics Concern   . Abuse/Domestic Violence Not Asked   . Breast Self Exam No   . Caffeine Concern Not Asked   . Calcium intake adequate Not Asked   . Computer Use Not Asked   . Drives Not Asked   . Exercise  Concern Not Asked   . Helmet Use Not Asked   . Seat Belt Not Asked   . Special Diet Not Asked   . Sunscreen used Not Asked   . Uses Cane Not Asked   . Uses walker Not Asked   . Uses wheelchair Not Asked   . Right hand dominant Not Asked   . Left hand dominant Not Asked   . Ambidextrous Not Asked   . Shift Work Not Asked   . Unusual Sleep-Wake Schedule Not Asked   . Ability to Walk 1 Flight of Steps without SOB/CP Yes   . Routine Exercise Yes     Comment: walking   . Ability to Walk 2 Flight of Steps without SOB/CP Not Asked   . Unable to Ambulate Not Asked   . Total Care Not Asked   . Ability To Do Own ADL's Yes   . Uses Walker Not Asked   . Other Activity Level Not Asked   . Uses Cane Not Asked   Social History Narrative   . Not on file       Objective:  BP 120/72   Pulse 94   Ht 1.6 m (5\' 3" )   Wt 64.4 kg (142 lb)   LMP 05/17/2017 (Exact Date)   BMI 25.15 kg/m         PHYSICAL EXAMINATION:    Constitutional: She is alert and oriented. She appears well-developed and well-nourished.     HENT: Head: Normocephalic. PERRLA. No thyromegaly.    Cardiovascular: Normal rate and regular rhythm.  S1-S2 and no murmurs    Pulm: Effort normal, breath sounds normal bilaterally    Abdomen: She exhibits no distension. Soft. No tenderness. She has no rebound and no guarding.     Neurological: She is alert and oriented.  She has normal reflexes.     Skin: Skin is warm and dry. No rashes or bruising.     Psychiatric: She has a normal mood and affect. Her behavior is normal.     Extremities: No lower extremity edema or calf tenderness bilaterally    BREAST EXAM: + nipple discharge; milky since Feb  Symmetric breasts.   Right normal breast; with no palpable masses, skin changes or nipple discharge expressed on exam. No tenderness to right breast.   Left normal breast; with no palpable masses, skin changes or nipple discharge expressed on exam.  No tenderness to left breast.   GU:  External Exam: External exam normal female  genitalia.  No urethral tenderness.  No genital lesions present.    Vaginal Exam:  Vagina normal to exam. Moist and pink vaginal mucosa, no abnormal discharge.   Bladder:  Bladder is normal to exam. Suprapubic fullness not present.  Uterus: Uterus is normal to exam. Normal size.  Cervix is normal to exam. No cervical discharge and no cervical motion tenderness.   Position: Anteverted  Contour:  Regular.    Mobility:  Mobile.    Adnexa:  No palpable masses and no tenderness bilaterally.    Rectovaginal exam:   deferred    Assessment & Plan:     ICD-10-CM    1. Exposure to STD Z20.2 CHLAMYDIA / N. GONORRHEA RNA     TRICHOMONAS VAGINALIS RNA   2. Pap smear for cervical cancer screening Z12.4 CYTOPATHOLOGY-GYN (PAP AND HPV TESTS)     HPV, HIGH RISK REFLEX TO GENOTYPE - [facility] ONLY   3. Galactorrhea of both breasts N64.3 THYROID STIMULATING HORMONE WITH FREE T4 REFLEX     PROLACTIN     [facility] - US BREAST BILATERAL - ENTIRE   4. Encounter for annual routine gynecological examination Z01.419        Orders Placed This Encounter   . [facility] - US BREAST BILATERAL - ENTIRE   . HPV, HIGH RISK REFLEX TO GENOTYPE - [facility] ONLY   . THYROID STIMULATING HORMONE WITH FREE T4 REFLEX   . PROLACTIN   . CHLAMYDIA / N. GONORRHEA RNA   . TRICHOMONAS VAGINALIS RNA   . CYTOPATHOLOGY-GYN (PAP AND HPV TESTS)     1. Discussed work up for galactorrhea, including labs and breast US.  2. Discussed need for routine pap f/u given her h/o abnormal paps.  3. Pap and HPV done.  4. Neg pregnancy test in office today.  5. Will see patient in 3 wks to review US/lab results.  6. Advised seeing psychiatric provider to review current meds in light of desiring pregnancy soon and not using consistent contraception.    Return in about 3 weeks (around 06/10/2017) for follow up US/labs.        Dr. Owens Shark available for consultation and acting as supervisory physician.                    Portions of this note may be dictated using voice recognition software. Variances in  spelling/vocabulary/word placement are possible and unintentional. Not all errors are caught/corrected. Please notify the [facility] Parkin if any discrepancies are noted or if the meaning of any statement needs clarification.

## 2017-05-21 LAB — TRICHOMONAS VAGINALIS RNA: TRICHOMONAS VAGINALIS RNA: NEGATIVE

## 2017-05-21 LAB — CHLAMYDIA TRACHOMATIS/NEISSERIA GONORRHOEAE RNA, NAAT
CHLAMYDIA TRACHOMATIS RNA: NEGATIVE
NEISSERIA GONORRHEA GC RNA: NEGATIVE

## 2017-05-22 ENCOUNTER — Other Ambulatory Visit (INDEPENDENT_AMBULATORY_CARE_PROVIDER_SITE_OTHER): Payer: Self-pay | Admitting: Family Medicine

## 2017-05-24 ENCOUNTER — Ambulatory Visit (INDEPENDENT_AMBULATORY_CARE_PROVIDER_SITE_OTHER): Payer: No Typology Code available for payment source | Admitting: Family Medicine

## 2017-05-25 ENCOUNTER — Ambulatory Visit (HOSPITAL_BASED_OUTPATIENT_CLINIC_OR_DEPARTMENT_OTHER)
Admission: RE | Admit: 2017-05-25 | Discharge: 2017-05-25 | Disposition: A | Discharge status: Home / Self Care | Payer: Medicaid Other | Source: Ambulatory Visit | Attending: Registered Nurse | Admitting: Registered Nurse

## 2017-05-25 ENCOUNTER — Ambulatory Visit (HOSPITAL_BASED_OUTPATIENT_CLINIC_OR_DEPARTMENT_OTHER): Payer: Medicaid Other

## 2017-05-25 ENCOUNTER — Telehealth (INDEPENDENT_AMBULATORY_CARE_PROVIDER_SITE_OTHER): Payer: Self-pay | Admitting: Registered Nurse

## 2017-05-25 ENCOUNTER — Encounter (HOSPITAL_BASED_OUTPATIENT_CLINIC_OR_DEPARTMENT_OTHER): Payer: Self-pay

## 2017-05-25 DIAGNOSIS — N643 Galactorrhea not associated with childbirth: Secondary | ICD-10-CM | POA: Insufficient documentation

## 2017-05-25 LAB — THYROID STIMULATING HORMONE WITH FREE T4 REFLEX: TSH: 0.594 u[IU]/mL (ref 0.340–5.330)

## 2017-05-25 LAB — HPV HIGH RISK REFLEX TO GENOTYPE - BMC/JMC ONLY: HPV RNA PCR: NEGATIVE

## 2017-05-25 LAB — PROLACTIN: PROLACTIN: 7.7 ng/mL (ref 3.3–26.7)

## 2017-05-25 NOTE — Telephone Encounter (Signed)
Amy,    Can you let her know her labs and her bilateral breast ultrasounds were normal. No follow up is indicated.    Thanks,   Lurena Joinerebecca

## 2017-06-01 ENCOUNTER — Telehealth (INDEPENDENT_AMBULATORY_CARE_PROVIDER_SITE_OTHER): Payer: Self-pay | Admitting: Registered Nurse

## 2017-06-01 LAB — HISTORICAL CYTOPATHOLOGY-GYN (PAP AND HPV TESTS)

## 2017-06-01 NOTE — Telephone Encounter (Signed)
Phone call to patient.  Discussed normal bilateral breast ultrasound and normal lab results.  In the absence of any abnormalities no further workup is indicated.  Patient verbalized understanding.Lindsay Mccullough.  Canceled her follow-up appointment with me.

## 2017-06-08 ENCOUNTER — Ambulatory Visit (INDEPENDENT_AMBULATORY_CARE_PROVIDER_SITE_OTHER): Payer: Self-pay | Admitting: Registered Nurse

## 2017-06-11 ENCOUNTER — Encounter (INDEPENDENT_AMBULATORY_CARE_PROVIDER_SITE_OTHER): Payer: Medicaid Other

## 2017-06-11 ENCOUNTER — Ambulatory Visit (INDEPENDENT_AMBULATORY_CARE_PROVIDER_SITE_OTHER): Payer: Medicaid Other | Admitting: Family

## 2017-06-11 ENCOUNTER — Encounter (INDEPENDENT_AMBULATORY_CARE_PROVIDER_SITE_OTHER): Payer: Self-pay | Admitting: Family

## 2017-06-11 VITALS — BP 123/85 | HR 131 | Temp 98.5°F | Resp 16 | Ht 63.0 in | Wt 144.0 lb

## 2017-06-11 DIAGNOSIS — R0981 Nasal congestion: Secondary | ICD-10-CM

## 2017-06-11 DIAGNOSIS — F17210 Nicotine dependence, cigarettes, uncomplicated: Secondary | ICD-10-CM

## 2017-06-11 DIAGNOSIS — R059 Cough, unspecified: Secondary | ICD-10-CM

## 2017-06-11 DIAGNOSIS — R05 Cough: Secondary | ICD-10-CM

## 2017-06-11 DIAGNOSIS — J019 Acute sinusitis, unspecified: Secondary | ICD-10-CM

## 2017-06-11 DIAGNOSIS — J111 Influenza due to unidentified influenza virus with other respiratory manifestations: Secondary | ICD-10-CM

## 2017-06-11 DIAGNOSIS — G501 Atypical facial pain: Secondary | ICD-10-CM

## 2017-06-11 DIAGNOSIS — R69 Illness, unspecified: Principal | ICD-10-CM

## 2017-06-11 DIAGNOSIS — B9689 Other specified bacterial agents as the cause of diseases classified elsewhere: Secondary | ICD-10-CM

## 2017-06-11 DIAGNOSIS — R6889 Other general symptoms and signs: Secondary | ICD-10-CM

## 2017-06-11 DIAGNOSIS — J45901 Unspecified asthma with (acute) exacerbation: Secondary | ICD-10-CM

## 2017-06-11 MED ORDER — DOXYCYCLINE HYCLATE 100 MG CAPSULE
100.00 mg | ORAL_CAPSULE | Freq: Two times a day (BID) | ORAL | 0 refills | Status: AC
Start: 2017-06-11 — End: 2017-06-18

## 2017-06-11 MED ORDER — PREDNISONE 20 MG TABLET
20.00 mg | ORAL_TABLET | Freq: Two times a day (BID) | ORAL | 0 refills | Status: AC
Start: 2017-06-11 — End: 2017-06-16

## 2017-06-11 MED ORDER — ALBUTEROL SULFATE HFA 90 MCG/ACTUATION AEROSOL INHALER
1.0000 | INHALATION_SPRAY | RESPIRATORY_TRACT | 0 refills | Status: AC | PRN
Start: 2017-06-11 — End: ?

## 2017-06-11 NOTE — Progress Notes (Signed)
[facility]  [address]  [place] [id number]    Name: Lindsay Mccullough  MRN: [id number]  DOB: 11/17/1985    Date of Service: 06/11/2017  Chief Complaint:   Chief Complaint            Congestion     Cough     Chest Congestion     Headache     Runny Nose     Chills     Fever         HPI: Lindsay Mccullough is a 32 y.o. female presenting today with complaints of a one-week history of nasal congestion, facial pressure, congested productive cough with yellow/green sputum, chills, sweats, fever ranging between 100-102.  Daughter sick with the flu last week.  Pt did have a flu shot.  She feels like she is getting worse.  Also reports shortness of breath on exertion.  Has a history of asthma, that is typically well controlled.  She has albuterol at home, and is taking this twice per day over the last 1 week, she states that this is helpful with her shortness breath.  Denies any history of pneumonia.  She is a stay-at-home mom.  She smokes about 3 cigarettes per day.  She has tried Robitussin and then allergy medicine without any relief.  Also reports taking Tylenol for the fever, last taken early this morning.  Afebrile in clinic 98.5.    Past Medical History  Current Outpatient Medications   Medication Sig   . albuterol sulfate (PROVENTIL OR VENTOLIN OR PROAIR) 90 mcg/actuation Inhalation HFA Aerosol Inhaler Take 1-2 Puffs by inhalation Every 4 hours as needed (Wheezing)   . cyclobenzaprine (FLEXERIL) 10 mg Oral Tablet 10 mg Three times a day    . Doxepin (SINEQUAN) 50 mg Oral Capsule Take 50 mg by mouth   . doxycycline hyclate (VIBRAMYCIN) 100 mg Oral Capsule Take 1 Cap (100 mg total) by mouth Twice daily for 7 days   . fluticasone (FLONASE) 50 mcg/actuation Nasal Spray, Suspension 1 Spray by Each Nostril route Once a day   . gabapentin (NEURONTIN) 100 mg Oral Capsule Take 100 mg by mouth Three times a day   . MULTIVITS,CA,MINERALS/IRON/FA (ONE-A-DAY WOMENS FORMULA ORAL) Take 1 Tab by mouth Once a day    . PARoxetine  (PAXIL) 10 mg Oral Tablet Take 10 mg by mouth Every morning   . predniSONE (DELTASONE) 20 mg Oral Tablet Take 1 Tab (20 mg total) by mouth Twice daily for 5 days     Allergies   Allergen Reactions   . Amoxicillin Anaphylaxis   . Dilaudid [Hydromorphone] Shortness of Breath   . Spironolactone Rash and Hives/ Urticaria   . Iodine      Blisters on left arm   . Penicillins Hives/ Urticaria     Past Medical History:   Diagnosis Date   . Anxiety    . Asthma     controlled with prn Inhaler   . Back problem     chronic lower back pain   . Blood in stool 11/16/2016   . Chronic pain     chronic lower back pain   . Cigarette smoker    . Depression    . Dysplasia of cervix    . Elevated d-dimer     Hx of   . H/O seasonal allergies    . Hx of bronchitis most recent 05/2016   . Left shoulder pain    . Migraine    . 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 Margo Aye evaluated neck 11/17/16, occ. neck pain   . Nephrolithiasis    . Panic attack    . Shortness of breath     Recenty with SOB and left shoulder blader pain-has to sit up to breathe    . Transfusion history 2013    with childbirth     Past Surgical History:   Procedure Laterality Date   . HX DILATION AND CURETTAGE  2006    sec. to miscarriage   . HX LITHOTRIPSY  10/2016     Family Medical History:     Problem Relation (Age of Onset)    Allergic rhinitis Brother    Breast Cancer Maternal Aunt    Cirrhosis Father    Colon Cancer Other    Congestive Heart Failure Mother    Diabetes Paternal Grandmother    Hearing Loss Mother    Heart Attack Mother    Heart Disease Mother    High Cholesterol Mother, Father    Hypertension Mother, Father, Brother        Social History     Socioeconomic History   . Marital status: Divorced     Spouse name: Not on file   . Number of children: Not on file   . Years of education: Not on file   . Highest education level: Not on file   Tobacco Use   . Smoking status: Current Every Day Smoker       Packs/day: 0.25     Years: 17.00     Pack years: 4.25     Types: Cigarettes   . Smokeless tobacco: Never Used   Substance and Sexual Activity   . Alcohol use: No     Alcohol/week: 0.0 oz   . Drug use: No   Other Topics Concern   . Breast Self Exam No   . Ability to Walk 1 Flight of Steps without SOB/CP Yes   . Routine Exercise Yes     Comment: walking   . Ability To Do Own ADL's Yes     Review of Systems  Constitutional: + fever, fatigue, chills, sweats.    Eyes: Denies change in vision, eye irritation or pain.    ENT:   Denies ear pain, discharge. + nasal congestion, + PND.   Cardiovascular: Denies any chest pain.    Respiratory: Denies any wheezing.  + SOBOE, + cough.  Skin: Denies any rash.      Physical Exam  General: No apparent acute distress.   Blood pressure 123/85, pulse (!) 131, temperature 36.9 C (98.5 F), temperature source Oral, resp. rate 16, height 1.6 m (5\' 3" ), weight 65.3 kg (144 lb), last menstrual period 05/17/2017, SpO2 97 %, not currently breastfeeding. HR re-check 110.   Eyes: Conjunctiva are clear. No discharge or icterus noted.  HENT: Mucous membranes are moist. Nares are clear. Posterior oropharynx is clear without erythema or exudates. + PND. BL ear canals without erythema, edema or debris.  No pain when pulling on pinna or tragus.  BL TM's clear. + ethmoid sinus TTP.    Neck: No cervical lymphadenopathy.   Lungs: Clear to auscultation bilaterally. Good air movement. No wheezes or rhonchi.  Speaking in full sentences.  Cardiovascular: Normal rate and regular rhythm. No murmurs, rubs or gallops.  Skin: Warm and dry.  No significant rashes or lesions.   Psychiatric: Alert and oriented x 3.  Affect within normal limits.    Assessment & Plan    ICD-10-CM    1. Influenza-like illness R69    2. Flu-like symptoms R68.89 POCT RAPID FLU   3. Cough R05 XR CHEST PA & LAT   4. Acute bacterial sinusitis J01.90 doxycycline hyclate (VIBRAMYCIN) 100 mg Oral Capsule    B96.89    5. Asthma  exacerbation, mild J45.901 albuterol sulfate (PROVENTIL OR VENTOLIN OR PROAIR) 90 mcg/actuation Inhalation HFA Aerosol Inhaler     predniSONE (DELTASONE) 20 mg Oral Tablet   Rapid flu negative.  CXR: appears negative upon my review, pending review by Radiology.  Discussed that may very well be the flu, but concerned for secondary bacterial infection due to ongoing persistent fever, tachycardia, and worsening of symptoms.  Treat for acute bacterial sinusitis with Doxycycline, allergy to Amoxicillin.  Will also cover for underlying lung involvement. Discussed s/e, avoid multivitamin while on this medication. No sunbathing.  Saline nasal spray, warm compresses to sinuses.  No wheezing on exam today, pt reports using her Albuterol BID, will Rx Prednisone for mild asthma exacerbation. Discussed s/e. Refilled Albuterol.  HR re-checked and was around 110.  Advised to increase fluids.  Should start to see some improvement in symptoms in next 48-72 hours, if no improvement should be re-evaluated.  Antibiotics as above.  Patient/parent educated to complete antibiotic course, despite resolution of symptoms.    Discussed adequate hand washing, as well as the risk of C.  Difficile with taking antibiotics.  Patient/parent educated on the use of a probiotic or yogurt containing live bacteria while taking the antibiotic.  Discussed that there is a risk for allergic reaction and when to seek further care.      Discussed with patient/parent that if any worsening of condition, or immediate concerns, they need to go to the Emergency Department.      Thurnell Lose, APRN  06/11/2017, 13:57    My Supervising Physician today is: Dr. Irving Burton

## 2017-06-11 NOTE — Progress Notes (Signed)
Your recent x-ray results are above.  Please continue plan of care as discussed at your visit.  We hope you are feeling better! Please call us with any questions or concerns.

## 2017-06-11 NOTE — Patient Instructions (Addendum)
[facility]  [address]  [place] [address]  Phone: [phone]  Fax: [phone]           Open Daily 8:00am - 8:00pm, except Sundays 12pm-8pm         ~ Closed Thanksgiving and Christmas Day     Attending Caregiver: Lindsay Lose, APRN    Today's orders:   Orders Placed This Encounter   . XR CHEST PA & LAT   . POCT RAPID FLU   . albuterol sulfate (PROVENTIL OR VENTOLIN OR PROAIR) 90 mcg/actuation Inhalation HFA Aerosol Inhaler   . predniSONE (DELTASONE) 20 mg Oral Tablet   . doxycycline hyclate (VIBRAMYCIN) 100 mg Oral Capsule        Prescription(s) E-Rx to:  [facility] - [place],  - [address]    ________________________________________________________________________  Short Term Disability and Family Medical Leave Act  [facility] does NOT provide assistance with any disability applications.  If you feel your medical condition requires you to be on disability, you will need to follow up with  Your primary care physician or a specialist.  We apologize for any inconvenience.    For Medication Prescribed by [facility]:  As an Urgent Care facility, our clinic does NOT offer prescription refills over the telephone.    If you need more of the medication one of our medical providers prescribed, you will  Either need to be re-evaluated by [place] or see your primary care physician.    ________________________________________________________________________      It is very important that we have a phone number that is the single best way to contact you in the event that we become aware of important clinical information or concerns after your discharge.  If the phone number you provided at registration is NOT this number you should inform staff and registration prior to leaving.      Your treatment and evaluation today was focused on identifying and treating potentially emergent conditions based on your presenting signs, symptoms, and history.  The resulting initial clinical  impression and treatment plan is not intended to be definitive or a substitute for a full physical examination and evaluation by your primary care provider.  If your symptoms persist, worsen, or you develop any new or concerning symptoms, you need to be evaluated.      If you received x-rays during your visit, be aware that the final and formal interpretation of those films by a radiologist may occur after your discharge.  If there is a significant discrepancy identified after your discharge, we will contact you at the telephone number provided at registration.      If you received a pelvic exam, you may have cultures pending for sexually transmitted diseases.  Positive cultures are reported to the [facility] Department of Health as required by state law.  You should be contacted if you cultures are positive.  We will not contact you if they are negative.  You did NOT receive a PAP smear (the screening test for cervical).  This specific test for women is best performed by your gynecologist or primary care provider when indicated.      If you are over 74 year old, we cannot discuss your personal health information with a parent, spouse, family member, or anyone else without your express consent.  This does not include those who have legitimate access to your records and information to assist in your care under the provisions of HIPAA [facility] Portability and  Accountability Act) law, or those to whom you have previously given express written consent to do so, such a legal guardian or Power of Attorney.      You may have received medication that may cause you to feel drowsy and/or light headed for several hours.  You may even experience some amnesia of your stay.  You should avoid operating a motor vehicle or performing any activity requiring complete alertness or coordination until you feel fully awake (approximately 24-48 hours).  Avoid alcoholic beverages.  You may also have a dry mouth for several hours.  This is  a normal side effect and will disappear as the effects of the medication wear off.      Instructions discussed with patient upon discharge by clinical staff with all questions answered.  Please call [facility] [phone] if any further questions.  Go immediately to the emergency department if any concern or worsening symptoms.    Lindsay Loseaylor Dasja Brase, APRN 06/11/2017, 14:16        Acute Bacterial Rhinosinusitis (ABRS)    Acute bacterial rhinosinusitis (ABRS) is an infection of your nasal cavity and sinuses. It's caused by bacteria. Acute means that you've had symptoms for less than 4 weeks, but possibly up to 12 weeks.  Understanding your sinuses  The nasal cavity is the large air-filled space behind your nose. The sinuses are a group of spaces formed by the bones of your face. They connect with your nasal cavity. ABRS causes the tissue lining these spaces to become inflamed. Mucus may not drain normally. This leads to facial pain and other symptoms.  What causes ABRS?  ABRS most often follows an upper respiratory infection caused by a virus. Bacteria then infect the lining of your nasal cavity and sinuses. But you can also get ABRS if you have:   Nasal allergies   Long-term nasal swelling and congestion not caused by allergies   Blockage in the nose  Symptoms of ABRS  The symptoms of ABRS may be different for each person and include:   Nasal congestion or blockage   Pain or pressure in the face   Thick, colored drainage from the nose  Other symptoms may include:   Runny nose   Fluid draining from the nose down the throat (postnasal drip)   Headache   Cough   Pain   Fever  Diagnosing ABRS  ABRS may be diagnosed if you've had an upper respiratory infection like a cold and cough for 10 or more days without improvement or with worsening symptoms. Your healthcare provider will ask about your symptoms and your medical history. The provider will check your vital signs, including your temperature. You'll have a  physical exam. The healthcare provider will check your ears, nose, and throat. You likely won't need any tests. If ABRS comes back, you may have a culture or other tests.  Treatment for ABRS  Treatment may include:   Antibiotic medicine. This is for symptoms that last for at least 10 to 14 days.   Nasal corticosteroid medicine. Drops or spray used in the nose can lessen swelling and congestion.   Over-the-counter pain medicine. This is to lessen sinus pain and pressure.   Nasal decongestant medicine. Spray or drops may help to lessen congestion. Do not use them for more than a few days.   Salt wash (saline irrigation). This can help to loosen mucus.  Possible complications of ABRS  ABRS may come back or become long-term (chronic). In rare cases, ABRS may  cause complications such as:   Inflamed tissue around the brain and spinal cord (meningitis)   Inflamed tissue around the eyes (orbital cellulitis)   Inflamed bones around the sinuses (osteitis)  These problems may need to be treated in a hospital with intravenous (IV) antibiotic medicine or surgery.  When to call the healthcare provider  Call your healthcare provider if you have any of the following:   Symptoms that don't get better, or get worse   Symptoms that don't get better after 3 to 5 days on antibiotics   Trouble seeing   Swelling around your eyes   Confusion or trouble staying awake   Date Last Reviewed: 07/01/2015   2000-2018 [employer]. [address], [place] [address]. All rights reserved. This information is not intended as a substitute for professional medical care. Always follow your healthcare professional's instructions.

## 2017-06-11 NOTE — Nursing Note (Signed)
06/11/17 1300   Rapid Influ A   Rapid Influenza A Negative   Initials [facility]   Lot# [id number]   Expiration Date 04/02/19   Internal Control Valid yes   Rapid Influ B   Rapid Influenza B Negative   Initials [facility]   Lot# [id number]   Expiration Date 04/02/19   Internal Control Valid yes

## 2017-06-11 NOTE — Nursing Note (Signed)
BP 123/85 (Site: Right, Patient Position: Sitting, Cuff Size: Adult)   Pulse (!) 131   Temp 36.9 C (98.5 F) (Oral)   Resp 16   Ht 1.6 m (5\' 3" )   Wt 65.3 kg (144 lb)   LMP 05/17/2017 (Exact Date)   SpO2 97%   BMI 25.51 kg/m     Harriette OharaHeather Jadene Stemmer, CMA  06/11/2017, 13:27

## 2017-06-13 ENCOUNTER — Encounter (INDEPENDENT_AMBULATORY_CARE_PROVIDER_SITE_OTHER): Payer: Self-pay

## 2017-06-13 NOTE — Nursing Note (Signed)
Follow-up call to urgent care visit. Left voice message for patient to call the office with any questions/concerns about the visit or if symptoms do not improve.  Carney HarderMichael Donata Reddick, RTR  06/13/2017, 14:34

## 2017-07-14 ENCOUNTER — Other Ambulatory Visit (INDEPENDENT_AMBULATORY_CARE_PROVIDER_SITE_OTHER): Payer: Self-pay | Admitting: Family Medicine

## 2017-08-06 ENCOUNTER — Ambulatory Visit (INDEPENDENT_AMBULATORY_CARE_PROVIDER_SITE_OTHER): Payer: No Typology Code available for payment source | Admitting: Family Medicine

## 2017-08-10 ENCOUNTER — Ambulatory Visit (INDEPENDENT_AMBULATORY_CARE_PROVIDER_SITE_OTHER): Payer: No Typology Code available for payment source | Admitting: Family Medicine

## 2017-08-13 ENCOUNTER — Ambulatory Visit (INDEPENDENT_AMBULATORY_CARE_PROVIDER_SITE_OTHER): Payer: Medicaid Other | Attending: Family

## 2017-08-13 ENCOUNTER — Encounter (INDEPENDENT_AMBULATORY_CARE_PROVIDER_SITE_OTHER): Payer: Self-pay | Admitting: Family

## 2017-08-13 ENCOUNTER — Ambulatory Visit (INDEPENDENT_AMBULATORY_CARE_PROVIDER_SITE_OTHER): Payer: Medicaid Other | Admitting: Family

## 2017-08-13 ENCOUNTER — Encounter (INDEPENDENT_AMBULATORY_CARE_PROVIDER_SITE_OTHER): Payer: Self-pay

## 2017-08-13 VITALS — BP 111/78 | HR 102 | Temp 98.2°F | Ht 63.0 in | Wt 150.4 lb

## 2017-08-13 DIAGNOSIS — R300 Dysuria: Secondary | ICD-10-CM

## 2017-08-13 DIAGNOSIS — F17210 Nicotine dependence, cigarettes, uncomplicated: Secondary | ICD-10-CM

## 2017-08-13 DIAGNOSIS — N390 Urinary tract infection, site not specified: Secondary | ICD-10-CM | POA: Insufficient documentation

## 2017-08-13 MED ORDER — NITROFURANTOIN MONOHYDRATE/MACROCRYSTALS 100 MG CAPSULE
100.00 mg | ORAL_CAPSULE | Freq: Two times a day (BID) | ORAL | 0 refills | Status: AC
Start: 2017-08-13 — End: 2017-08-18

## 2017-08-13 MED ORDER — PHENAZOPYRIDINE 200 MG TABLET
200.00 mg | ORAL_TABLET | Freq: Three times a day (TID) | ORAL | 0 refills | Status: DC | PRN
Start: 2017-08-13 — End: 2017-10-13

## 2017-08-13 NOTE — Nursing Note (Signed)
BP 111/78 (Site: Upper Extremity, Patient Position: Sitting, Cuff Size: Adult)   Pulse (!) 102   Temp 36.8 C (98.2 F) (Oral)   Ht 1.6 m (5\' 3" )   Wt 68.2 kg (150 lb 6.4 oz)   SpO2 98%   BMI 26.64 kg/m   Carney HarderMichael Mathews Stuhr, RTR  08/13/2017, 14:21

## 2017-08-13 NOTE — Nursing Note (Signed)
08/13/17 1400   Urine   QC Urine yes   Time collected 1440   Glucose Negative   Bilirubin Negative   Ketones Negative   Urine Specific Gravity 1.010   Blood (urine) Negative   pH 6.5   Protein Negative   Urobilinogen 0.2mg /dL (Normal)   Nitrite Negative   Leukocytes Negative   Initials [facility]

## 2017-08-13 NOTE — Progress Notes (Signed)
[facility]  [address]  [place] [address]    Name: Lindsay Mccullough  MRN: [id number]  DOB: 1985-03-24    Date of Service: 08/13/2017  Chief Complaint:   Chief Complaint            Urinary Pain x 5 days    Urinary Frequency     Urinary Urgency         HPI: Lindsay Mccullough is a 32 y.o. female presenting today with c/o a 5 day history of urinary urgency, frequency, dysuria.  Feels like she has a UTI. Gets these every once in awhile.  Has tried Cranberry juice, increasing water intake & Azo without relief in symptoms.    Feels like she is voiding every 5 min.  She has had some chills, however no measurable fever.  She also feels that she has cramps to her abdomen.  She denies any back pain, fever, vomiting.  With 1 partner.  LMP 07/26/2017.  STD testing completed in March of 2019, gonorrhea, chlamydia, Trichomonas were negative.  She denies any concerns for STDs currently. Denies vaginal discharge. She is a stay-at-home mom.     Past Medical History  Current Outpatient Medications   Medication Sig   . albuterol sulfate (PROVENTIL OR VENTOLIN OR PROAIR) 90 mcg/actuation Inhalation HFA Aerosol Inhaler Take 1-2 Puffs by inhalation Every 4 hours as needed (Wheezing)   . ARIPiprazole (ABILIFY) 10 mg Oral Tablet    . cyclobenzaprine (FLEXERIL) 10 mg Oral Tablet 10 mg Three times a day    . Doxepin (SINEQUAN) 50 mg Oral Capsule Take 50 mg by mouth   . fluticasone (FLONASE) 50 mcg/actuation Nasal Spray, Suspension 1 Spray by Each Nostril route Once a day   . gabapentin (NEURONTIN) 100 mg Oral Capsule Take 100 mg by mouth Three times a day   . lamoTRIgine (LAMICTAL) 100 mg Oral Tablet TAKE 1 TABLET BY MOUTH ONCE DAILY TO FOLLOW UPWARD TITRATION   . MULTIVITS,CA,MINERALS/IRON/FA (ONE-A-DAY WOMENS FORMULA ORAL) Take 1 Tab by mouth Once a day    . nitrofurantoin (MACROBID) 100 mg Oral Capsule Take 1 Cap (100 mg total) by mouth Twice daily for 5 days   . PARoxetine (PAXIL) 10 mg Oral Tablet Take 10 mg by mouth Every morning     . phenazopyridine (PYRIDIUM) 200 mg Oral Tablet Take 1 Tab (200 mg total) by mouth Three times a day as needed for Pain     Allergies   Allergen Reactions   . Amoxicillin Anaphylaxis   . Dilaudid [Hydromorphone] Shortness of Breath   . Spironolactone Rash and Hives/ Urticaria   . Iodine      Blisters on left arm   . Penicillins Hives/ Urticaria     Past Medical History:   Diagnosis Date   . Anxiety    . Asthma     controlled with prn Inhaler   . Back problem     chronic lower back pain   . Blood in stool 11/16/2016   . Chronic pain     chronic lower back pain   . Cigarette smoker    . Depression    . Dysplasia of cervix    . Elevated d-dimer     Hx of   . H/O seasonal allergies    . Hx of bronchitis most recent 05/2016   . Left shoulder pain    . Migraine    . 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 to  old injuries, some limited ROM of neck per patient-Dr. Margo AyeHall evaluated neck 11/17/16, occ. neck pain   . Nephrolithiasis    . Panic attack    . Shortness of breath     Recenty with SOB and left shoulder blader pain-has to sit up to breathe    . Transfusion history 2013    with childbirth     Past Surgical History:   Procedure Laterality Date   . HX DILATION AND CURETTAGE  2006    sec. to miscarriage   . HX LITHOTRIPSY  10/2016     Family Medical History:     Problem Relation (Age of Onset)    Allergic rhinitis Brother    Breast Cancer Maternal Aunt    Cirrhosis Father    Colon Cancer Other    Congestive Heart Failure Mother    Diabetes Paternal Grandmother    Hearing Loss Mother    Heart Attack Mother    Heart Disease Mother    High Cholesterol Mother, Father    Hypertension (High Blood Pressure) Mother, Father, Brother        Social History     Socioeconomic History   . Marital status: Divorced     Spouse name: Not on file   . Number of children: Not on file   . Years of education: Not on file   . Highest education level: Not on file   Tobacco Use   . Smoking status: Current Every Day Smoker     Packs/day:  0.25     Years: 17.00     Pack years: 4.25     Types: Cigarettes   . Smokeless tobacco: Never Used   Substance and Sexual Activity   . Alcohol use: No     Alcohol/week: 0.0 oz   . Drug use: No   Other Topics Concern   . Breast Self Exam No   . Ability to Walk 1 Flight of Steps without SOB/CP Yes   . Routine Exercise Yes     Comment: walking   . Ability To Do Own ADL's Yes     Review of Systems  Constitutional: Denies fevers, fatigue.  + chills.   Cardiovascular: Denies any chest pain.     Respiratory: Denies any shortness of breath or cough.    GI: Denies any abdominal pain, nausea, vomiting, diarrhea or constipation. No melena.  GU: Denies any hematuria. + dysuria, frequency, urgency.     Skin: Denies any rash.      Physical Exam  General: No apparent acute distress. Very pleasant.   Blood pressure 111/78, pulse (!) 102, temperature 36.8 C (98.2 F), temperature source Oral, height 1.6 m (5\' 3" ), weight 68.2 kg (150 lb 6.4 oz), SpO2 98 %, not currently breastfeeding.  Eyes: Conjunctiva are clear. No discharge or icterus noted.  Neck: No cervical lymphadenopathy.   Lungs: Clear to auscultation bilaterally. Good air movement. No wheezes or rhonchi.    Cardiovascular: Normal rate and regular rhythm. No murmurs, rubs or gallops.  Abdomen: No guarding. Soft. BS present. + mild tenderness diffusely to lower quadrants without rebound, worsened to suprapubic area. No CVA tenderness.    Skin: Warm and dry.  No significant rashes or lesions.     Urine Dip Results:   Time collected: 1440  Glucose: Negative  Bilirubin: Negative  Ketones: Negative  Urine Specific Gravity: 1.010  Blood (urine): Negative  Protein: Negative  Urobilinogen: 0.2mg /dL (Normal)  Nitrite: Negative  Leukocytes: Negative    Assessment &  Plan    ICD-10-CM    1. UTI (urinary tract infection) N39.0 nitrofurantoin (MACROBID) 100 mg Oral Capsule     phenazopyridine (PYRIDIUM) 200 mg Oral Tablet     URINE CULTURE   2. Dysuria R30.0 POCT URINE DIPSTICK   Urine  dip is negative.  Due to s/sx will treat for UTI with Macrobid 100 mg PO BID for 5 days.  Also given Pyridium to use 3x day PRN not for longer than 2 days.  Increase fluids.  Will send urine culture.  Discussed s/sx of pyelonephritis & when to seek further care.  Discussed with patient/parent that if any worsening of condition, or immediate concerns, they need to go to the Emergency Department.      Thurnell Lose, APRN  08/13/2017, 14:36    My Supervising Physician today is: Dr. Lawana Pai

## 2017-08-14 NOTE — Progress Notes (Signed)
Coagulase negative Staph  Current Outpatient Medications:  nitrofurantoin (MACROBID) 100 mg Oral Capsule, Take 1 Cap (100 mg total) by mouth Twice daily for 5 days

## 2017-08-15 ENCOUNTER — Encounter (INDEPENDENT_AMBULATORY_CARE_PROVIDER_SITE_OTHER): Payer: Self-pay

## 2017-08-15 LAB — URINE CULTURE: URINE CULTURE: 20000 — AB

## 2017-08-15 NOTE — Progress Notes (Signed)
Coag-negative staph.  Sensitive to Macrobid.  Continue management.  Follow up with PCP if not improving.    Eveline KetoBrian J Krithika Tome, MD  08/15/2017, 11:43

## 2017-08-15 NOTE — Nursing Note (Signed)
Pt seen in urgent care 2 days ago. Attempted to call for follow up, no answer at this time. Left VM to call back with any questions or concerns.   Marcy Sirenharles Mattix Imhof, RTR  08/15/2017, 19:19

## 2017-08-16 ENCOUNTER — Telehealth (INDEPENDENT_AMBULATORY_CARE_PROVIDER_SITE_OTHER): Payer: Self-pay | Admitting: Family

## 2017-08-16 NOTE — Telephone Encounter (Signed)
Spoke with patient and notified of urine culture results. Patient has no questions or concerns.    Harriette OharaHeather Harlin Mazzoni, CMA  08/16/2017, 16:38

## 2017-09-07 ENCOUNTER — Encounter (INDEPENDENT_AMBULATORY_CARE_PROVIDER_SITE_OTHER): Payer: Self-pay | Admitting: Family Medicine

## 2017-09-07 ENCOUNTER — Ambulatory Visit (INDEPENDENT_AMBULATORY_CARE_PROVIDER_SITE_OTHER): Payer: No Typology Code available for payment source | Admitting: Family Medicine

## 2017-09-07 VITALS — BP 114/68 | HR 98 | Temp 97.8°F | Resp 16 | Ht 61.0 in | Wt 148.6 lb

## 2017-09-07 DIAGNOSIS — G4709 Other insomnia: Secondary | ICD-10-CM

## 2017-09-07 DIAGNOSIS — F319 Bipolar disorder, unspecified: Secondary | ICD-10-CM

## 2017-09-07 MED ORDER — DOXEPIN HCL 50 MG PO CAPS
50.00 mg | ORAL_CAPSULE | Freq: Every evening | ORAL | 3 refills | Status: DC
Start: ? — End: 2017-09-07

## 2017-09-07 NOTE — Progress Notes (Signed)
Subjective:    Patient ID: Regina Adams is a 32 y.o. female.    Complains of insomnia and has not slept for 45 days.  She takes Abilify and Lamictal for bipolar disorder.  She sees psychiatrist in [place].  She asked her for a sleep aid however was told that she needs to see her PCP for that.  She tosses and turns at night she cannot turn her mind off at night. She is under a lot of stress. Recent stressors, father died in 06/25/2022 and brother is missing.  Her brother has schizophrenia/mental disorder.   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      The following portions of the patient's history were reviewed and updated as appropriate: allergies, current medications, past family history, past medical history, past social history, past surgical history and problem list.    Past Medical History:   Diagnosis Date   . Anemia    . Anxiety    . Asthma without status asthmaticus    . Depression    . Hormone disorder    . Migraines    . 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 albuterol (VENTOLIN HFA) 108 (90 Base) MCG/ACT inhaler Inhale 2 puffs into the lungs every 6 (six) hours as needed for Wheezing. 1 Inhaler 0   . cyclobenzaprine (FLEXERIL) 10 MG tablet TAKE 1 TABLET BY MOUTH THREE TIMES DAILY AS NEEDED FOR MUSCLE SPASM 90 tablet 1   . doxepin (SINEQUAN) 50 MG capsule Take 1 capsule (50 mg total) by mouth nightly. 30 capsule 0   . fluticasone (FLONASE) 50 MCG/ACT nasal spray 2 sprays by Nasal route daily         . gabapentin (NEURONTIN) 100 MG capsule TAKE 1 CAPSULE BY MOUTH THREE TIMES DAILY 90 capsule 1   . ibuprofen (ADVIL,MOTRIN) 800 MG tablet Take 1 tablet (800 mg total) by mouth every 8 (eight) hours as needed for Pain. 20 tablet 0   . Multiple Vitamins-Calcium (DAILY COMBO MULTIVITS/CALCIUM PO) Take by mouth.     Marland Kitchen PARoxetine (PAXIL) 20 MG tablet Take 20 mg by mouth every morning.       No  current facility-administered medications on file prior to visit.      Allergies   Allergen Reactions   . Amoxicillin Anaphylaxis   . Hydromorphone Shortness Of Breath   . Iodine Other (See Comments)     Blisters on left arm   . Spironolactone Hives and Rash   . Penicillins Hives     Review of Systems   Constitutional: Positive for fatigue and unexpected weight change. Negative for fever.   HENT: Negative for congestion.    Respiratory: Negative for cough, shortness of breath and wheezing.    Cardiovascular: Negative for chest pain and palpitations.   Musculoskeletal: Negative.    Neurological: Positive for headaches. Negative for dizziness, tremors, syncope, weakness and numbness.   Hematological: Negative for adenopathy.   Psychiatric/Behavioral: Positive for dysphoric mood and sleep disturbance. Negative for agitation, behavioral problems, confusion and suicidal ideas. The patient is nervous/anxious (More anxious). The patient is not hyperactive.          Objective:    Physical Exam   Constitutional: She is oriented to person, place, and time. She appears well-developed and well-nourished. No distress.   Appears tired   HENT:  Head: Normocephalic and atraumatic.   Right Ear: External ear normal.   Left Ear: External ear normal.   Mouth/Throat: Oropharynx is clear and moist. No oropharyngeal exudate.   Eyes: Conjunctivae and EOM are normal.   Neck: Neck supple. No thyromegaly present.   Cardiovascular: Normal heart sounds.    Pulmonary/Chest: Breath sounds normal.   Neurological: She is alert and oriented to person, place, and time. No cranial nerve deficit or sensory deficit.   Psychiatric: She has a normal mood and affect. Her behavior is normal. Thought content normal.   Nursing note and vitals reviewed.          Assessment:       1. Bipolar 1 disorder    2. Other insomnia          Plan:       1. Bipolar 1 disorder  -Continue taking the Abilify and Lamictal.  Follow-up with psychiatry.  -Currently sees  psychologist Marchelle Folks has a follow-up appointment tomorrow for cognitive behavioral therapy.    2. Other insomnia  -Restart her doxepin (SINEQUAN) 50 MG capsule; Take 1 capsule (50 mg total) by mouth nightly  Dispense: 30 capsule; Refill: 3 to help her sleep.  - Polysomonography any Age Sleep STG 1-3PRM; Future  -Management of bipolar disorder.  -Also the following may help her go to sleep.  ?Establishment of a stable bedtime and wake time seven days per week  ?Reduction in time in bed to approximate the total hours of estimated sleep (sleep restriction)  ?Encouragement to use the bed only for sleep and sex; try to sleep only when sleepy; and get out of bed if anxiety occurs while unable to sleep (stimulus control)  ?Sleep hygiene, which includes avoidance of substances that interfere with sleep, avoidance of naps to maximize sleep drive, and optimization of the comfort of the sleep environment.

## 2017-10-13 ENCOUNTER — Emergency Department (HOSPITAL_BASED_OUTPATIENT_CLINIC_OR_DEPARTMENT_OTHER)
Admission: EM | Admit: 2017-10-13 | Discharge: 2017-10-13 | Disposition: A | Discharge status: Home / Self Care | Payer: Medicaid Other | Attending: Emergency Medicine | Admitting: Emergency Medicine

## 2017-10-13 ENCOUNTER — Encounter (HOSPITAL_BASED_OUTPATIENT_CLINIC_OR_DEPARTMENT_OTHER): Payer: Self-pay

## 2017-10-13 DIAGNOSIS — G8929 Other chronic pain: Secondary | ICD-10-CM | POA: Insufficient documentation

## 2017-10-13 DIAGNOSIS — I4510 Unspecified right bundle-branch block: Secondary | ICD-10-CM | POA: Insufficient documentation

## 2017-10-13 DIAGNOSIS — Z79899 Other long term (current) drug therapy: Secondary | ICD-10-CM | POA: Insufficient documentation

## 2017-10-13 DIAGNOSIS — R000 Tachycardia, unspecified: Secondary | ICD-10-CM | POA: Insufficient documentation

## 2017-10-13 DIAGNOSIS — M549 Dorsalgia, unspecified: Secondary | ICD-10-CM | POA: Insufficient documentation

## 2017-10-13 DIAGNOSIS — F17210 Nicotine dependence, cigarettes, uncomplicated: Secondary | ICD-10-CM | POA: Insufficient documentation

## 2017-10-13 DIAGNOSIS — Z7951 Long term (current) use of inhaled steroids: Secondary | ICD-10-CM | POA: Insufficient documentation

## 2017-10-13 DIAGNOSIS — F329 Major depressive disorder, single episode, unspecified: Secondary | ICD-10-CM | POA: Insufficient documentation

## 2017-10-13 DIAGNOSIS — R079 Chest pain, unspecified: Secondary | ICD-10-CM | POA: Insufficient documentation

## 2017-10-13 LAB — COMPREHENSIVE METABOLIC PROFILE - BMC/JMC ONLY
ALBUMIN/GLOBULIN RATIO: 1.3 (ref 0.8–2.0)
ALBUMIN: 4 g/dL (ref 3.5–5.0)
ALKALINE PHOSPHATASE: 92 U/L (ref 38–126)
ALT (SGPT): 46 U/L (ref 14–54)
ANION GAP: 11 mmol/L (ref 3–11)
AST (SGOT): 21 U/L (ref 15–41)
BILIRUBIN TOTAL: 0.4 mg/dL (ref 0.3–1.2)
BUN/CREA RATIO: 8 (ref 6–22)
BUN: 5 mg/dL — ABNORMAL LOW (ref 6–20)
CALCIUM: 9.3 mg/dL (ref 8.6–10.3)
CHLORIDE: 101 mmol/L (ref 101–111)
CO2 TOTAL: 26 mmol/L (ref 22–32)
CREATININE: 0.62 mg/dL (ref 0.44–1.00)
ESTIMATED GFR: >60 mL/min/1.73mˆ2 (ref >60)
GLUCOSE: 92 mg/dL (ref 70–110)
POTASSIUM: 4 mmol/L (ref 3.4–5.1)
PROTEIN TOTAL: 7.1 g/dL (ref 6.4–8.3)
SODIUM: 138 mmol/L (ref 136–145)

## 2017-10-13 LAB — CBC WITH DIFF
BASOPHIL #: 0 x10ˆ3/uL (ref 0.00–0.10)
BASOPHIL %: 0 % (ref 0–3)
EOSINOPHIL #: 0 x10ˆ3/uL (ref 0.00–0.50)
EOSINOPHIL %: 1 % (ref 0–5)
HCT: 44.6 % (ref 36.0–45.0)
HGB: 14.9 g/dL (ref 12.0–15.5)
LYMPHOCYTE #: 2.7 10*3/uL (ref 1.00–4.80)
LYMPHOCYTE %: 37 % (ref 15–43)
MCH: 32.9 pg (ref 27.5–33.2)
MCHC: 33.4 g/dL (ref 32.0–36.0)
MCV: 98.4 fL — ABNORMAL HIGH (ref 82.0–97.0)
MONOCYTE #: 0.5 x10ˆ3/uL (ref 0.20–0.90)
MONOCYTE %: 7 % (ref 5–12)
MPV: 7.1 fL — ABNORMAL LOW (ref 7.4–10.5)
NEUTROPHIL #: 4.1 x10ˆ3/uL (ref 1.50–6.50)
NEUTROPHIL %: 56 % (ref 43–76)
PLATELETS: 264 x10ˆ3/uL (ref 150–450)
RBC: 4.54 x10ˆ6/uL (ref 4.00–5.10)
RDW: 13.2 % (ref 11.0–16.0)
WBC: 7.4 x10ˆ3/uL (ref 4.0–11.0)

## 2017-10-13 LAB — TROPONIN-I: TROPONIN I: <0.03 ng/mL (ref <=0.06)

## 2017-10-13 LAB — RED TOP TUBE

## 2017-10-13 LAB — BLUE TOP TUBE

## 2017-10-13 MED ORDER — SODIUM CHLORIDE 0.9 % (FLUSH) INJECTION SYRINGE
10.0000 mL | INJECTION | Freq: Three times a day (TID) | INTRAMUSCULAR | Status: DC
Start: 2017-10-13 — End: 2017-10-13

## 2017-10-13 NOTE — ED Provider Notes (Signed)
[facility]  Emergency Department Visit Note    Date:  10/13/2017  Primary care provider:  Gean Birchwood, MD  Means of arrival:  private car  History obtained from: patient  History limited by: none    Chief Complaint:  Chest pain    HISTORY OF PRESENT ILLNESS     [facility], date of birth 10-26-1985, is a 32 y.o. female who has a history of elevated d-dimer and anxiety presents to the Emergency Department complaining of intermittent Chest pain that began last night. She rates the severity of her pain an 8/10. She states that she was laying down at the onset, and notes that she couldn't breath or move. The patient also states that she had accompanying arm pain at the onset of her chest pain as well. She mentions that this morning she another episode of chest pain. She denies any other modifying factors or accompanying symptoms. No abdominal pain, bilateral extremity edema, urinary symptoms, or headaches. The patient adds that she has a history of anxiety and panic attacks and reports that this may be the reasoning behind her symptoms.   REVIEW OF SYSTEMS     The pertinent positive and negative symptoms are as per HPI. All other systems reviewed and are negative.     PATIENT HISTORY     Past Medical History:  Past Medical History:   Diagnosis Date   . Anxiety    . Asthma     controlled with prn Inhaler   . Back problem     chronic lower back pain   . Blood in stool 11/16/2016   . Chronic pain     chronic lower back pain   . Cigarette smoker    . Depression    . Dysplasia of cervix    . Elevated d-dimer     Hx of   . H/O seasonal allergies    . Hx of bronchitis most recent 05/2016   . Left shoulder pain    . Migraine    . 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 Margo Aye evaluated neck 11/17/16, occ. neck pain   . Nephrolithiasis    . Panic attack    . Shortness of breath     Recenty with SOB and left shoulder blader pain-has to  sit up to breathe    . Transfusion history 2013    with childbirth       Past Surgical History:  Past Surgical History:   Procedure Laterality Date   . Hx dilation and curettage  2006   . Hx lithotripsy  10/2016       Family History:  Family Medical History:     Problem Relation (Age of Onset)    Allergic rhinitis Brother    Breast Cancer Maternal Aunt    Cirrhosis Father    Colon Cancer Other    Congestive Heart Failure Mother    Diabetes Paternal Grandmother    Hearing Loss Mother    Heart Attack Mother    Heart Disease Mother    High Cholesterol Mother, Father    Hypertension (High Blood Pressure) Mother, Father, Brother        Social History:  Social History     Tobacco Use   . Smoking status: Current Every Day Smoker     Packs/day: 0.25     Years: 17.00  Pack years: 4.25     Types: Cigarettes   . Smokeless tobacco: Never Used   Substance Use Topics   . Alcohol use: No     Alcohol/week: 0.0 standard drinks   . Drug use: No     Social History     Substance and Sexual Activity   Drug Use No       Medications:  Current Outpatient Medications   Medication Sig   . albuterol sulfate (PROVENTIL OR VENTOLIN OR PROAIR) 90 mcg/actuation Inhalation HFA Aerosol Inhaler Take 1-2 Puffs by inhalation Every 4 hours as needed (Wheezing)   . cyclobenzaprine (FLEXERIL) 10 mg Oral Tablet 10 mg Three times a day    . fluticasone (FLONASE) 50 mcg/actuation Nasal Spray, Suspension 1 Spray by Each Nostril route Once a day   . gabapentin (NEURONTIN) 100 mg Oral Capsule Take 100 mg by mouth Three times a day   . MULTIVITS,CA,MINERALS/IRON/FA (ONE-A-DAY WOMENS FORMULA ORAL) Take 1 Tab by mouth Once a day      Allergies:  Allergies   Allergen Reactions   . Amoxicillin Anaphylaxis   . Dilaudid [Hydromorphone] Shortness of Breath   . Spironolactone Rash and Hives/ Urticaria   . Iodine      Blisters on left arm   . Penicillins Hives/ Urticaria       PHYSICAL EXAM     Vitals:  Filed Vitals:    10/13/17 1001   BP: 119/84   Pulse: (!) 104      Resp: 16   Temp: 36.8 C (98.3 F)   SpO2: 98%       Pulse ox  98% on None (Room Air) interpreted by me as: Normal    General: Awake. Alert. no acute distress.  Head:  Atraumatic     Eyes: Anicteric sclera, noninjected conjunctiva.  PERRL. EOMI.  ENT: Oropharynx patent, pink, moist, no erythema, exudates or asymmetry. No stridor.   Neck: Neck is supple, nontender, no adenopathy. No nuchal rigidity.   Lungs: Clear to auscultation bilaterally. No wheezes, rales or rhonchi.  no respiratory distress. Positive tenderness with palpation to anterior sternum no crepitus or deformity.   Cardiovascular:  Heart is regular without murmurs rubs or gallops   Abdomen:  Soft, nontender, non-distended, normoactive bowel sounds. No peritoneal signs. No pulsatile mass.  Extremities:  No cyanosis, edema, tenderness or asymmetry.  Skin: Skin warm, dry and well-perfused. No petechia or erythema.   Back:  Non-tender to palpation in the midline.  Neurologic: Awake, alert, no lethargy, somnolence or confusion. Moves upper and lower extremities without focal deficits.   Psychiatric: Answers questions appropriately.  DIAGNOSTIC STUDIES     Labs:    Results for orders placed or performed during the hospital encounter of 10/13/17   COMPREHENSIVE METABOLIC PROFILE - [facility] ONLY   Result Value Ref Range    SODIUM 138 136 - 145 mmol/L    POTASSIUM 4.0 3.4 - 5.1 mmol/L    CHLORIDE 101 101 - 111 mmol/L    CO2 TOTAL 26 22 - 32 mmol/L    ANION GAP 11 3 - 11 mmol/L    BUN 5 (L) 6 - 20 mg/dL    CREATININE 0.450.62 4.090.44 - 1.00 mg/dL    BUN/CREA RATIO 8 6 - 22    ESTIMATED GFR >60 >60 mL/min/1.8262m^2    ALBUMIN 4.0 3.5 - 5.0 g/dL    CALCIUM 9.3 8.6 - 81.110.3 mg/dL    GLUCOSE 92 70 - 914110 mg/dL    ALKALINE PHOSPHATASE  92 38 - 126 U/L    ALT (SGPT) 46 14 - 54 U/L    AST (SGOT) 21 15 - 41 U/L    BILIRUBIN TOTAL 0.4 0.3 - 1.2 mg/dL    PROTEIN TOTAL 7.1 6.4 - 8.3 g/dL    ALBUMIN/GLOBULIN RATIO 1.3 0.8 - 2.0   TROPONIN-I   Result Value Ref Range    TROPONIN I <0.03  <=0.06 ng/mL   CBC WITH DIFF   Result Value Ref Range    WBC 7.4 4.0 - 11.0 x10^3/uL    RBC 4.54 4.00 - 5.10 x10^6/uL    HGB 14.9 12.0 - 15.5 g/dL    HCT 16.144.6 09.636.0 - 04.545.0 %    MCV 98.4 (H) 82.0 - 97.0 fL    MCH 32.9 27.5 - 33.2 pg    MCHC 33.4 32.0 - 36.0 g/dL    RDW 40.913.2 81.111.0 - 91.416.0 %    PLATELETS 264 150 - 450 x10^3/uL    MPV 7.1 (L) 7.4 - 10.5 fL    NEUTROPHIL % 56 43 - 76 %    LYMPHOCYTE % 37 15 - 43 %    MONOCYTE % 7 5 - 12 %    EOSINOPHIL % 1 0 - 5 %    BASOPHIL % 0 0 - 3 %    NEUTROPHIL # 4.10 1.50 - 6.50 x10^3/uL    LYMPHOCYTE # 2.70 1.00 - 4.80 x10^3/uL    MONOCYTE # 0.50 0.20 - 0.90 x10^3/uL    EOSINOPHIL # 0.00 0.00 - 0.50 x10^3/uL    BASOPHIL # 0.00 0.00 - 0.10 x10^3/uL   BLUE TOP TUBE   Result Value Ref Range    RAINBOW/EXTRA TUBE AUTO RESULT Yes      Labs reviewed and interpreted by me.    EKG:  12 lead EKG interpreted by me shows sinus tachycardia,  rate of 102 bpm, incomplete RBBB, no significant changes compared to 01/31/17.     ED PROGRESS NOTE / MEDICAL DECISION MAKING     Old records reviewed by me:  I have reviewed the nurse's notes. I have reviewed the patient's problem list and pertinent past medical records.    The patient has an elevated d dimer listed in her history. It appears that she had three elevated d dimer readings in 2017. In 07/2015 she had a negative CT angio, and a negative CT angio in 10/2016.     Orders Placed This Encounter   . COMPREHENSIVE METABOLIC PROFILE - [facility] ONLY   . TROPONIN-I   . CBC WITH DIFF   . ECG 12-LEAD   . INSERT & MAINTAIN PERIPHERAL IV ACCESS   . NS flush syringe          CBC, CMP, Troponin, CBC, and ECG was initially ordered.    11:33: Initial evaluation is complete at this time. I discussed with the patient that I would review her lab results to further evaluate. Patient is agreeable with the treatment plan at this time.     12:50: On recheck, the patient states that she overall she feels improved. I explained the results of the diagnostic studies. She  notes that she has some heartburn symptoms now. She states that she has a history of heartburn in the past and that she has started taking Zantac for it. I offered her a GI cocktail but she declined. I discussed with her that there is no reasonable  that she probably of a serious emergent pathology such as PE. I do  not recommend CT imaging and she is in full agreement with this plan. Patient will be discharged. Patient is to follow up with Gean Birchwood, MD in one week. I discussed the diagnosis, disposition, and follow-up plan. The patient understood and is in accordance with the treatment plan at this time. Patient is to return here to the Emergency Department if new or worsening symptoms appear. All of their questions have been answered to their satisfaction. The patient is in stable condition at the time of discharge.     MIPS     Not applicable     OPIATE PRESCRIPTION       Not applicable    CORE MEASURES      Not applicable    CRITICAL CARE TIME      Not applicable     PRE-DISPOSITION VITALS      Pre-Disposition Vitals:  Filed Vitals:    10/13/17 1001 10/13/17 1130 10/13/17 1245   BP: 119/84 113/85 108/83   Pulse: (!) 104 97 90   Resp: 16 12 16    Temp: 36.8 C (98.3 F)     SpO2: 98% 97% 99%       CLINICAL IMPRESSION     Encounter Diagnosis   Name Primary?   . Acute chest pain Yes     DISPOSITION/PLAN     Discharged     Follow-Up:  Gean Birchwood, MD  [facility]  [address]  [address]  [place] [address]  [phone]    In 1 week      [facility] - Emergency Department  [facility]  [place] [address]  [phone]    Return right away, if symptoms worsen      Condition at Disposition: Stable       SCRIBE ATTESTATION STATEMENT  I Robyne Peers, SCRIBE scribed for Kennon Rounds, DO on 10/13/2017 at 11:23 AM.     Documentation assistance provided for Kennon Rounds, DO  by Robyne Peers, SCRIBE. Information recorded by the scribe was done at my  direction and has been reviewed and validated by me Kennon Rounds, DO.

## 2017-10-13 NOTE — ED Triage Notes (Addendum)
reports left sided chest pain since yesterday with shortness of breath. Skin pink, warm, dry. Ambulatory in triage. Speaking in full sentences. Reports history of anxiety and panic attacks.

## 2017-10-13 NOTE — ED Nurses Note (Signed)
Patient discharged home with family.  AVS reviewed with patient/care giver.  A written copy of the AVS and discharge instructions was given to the patient/care giver.  Questions sufficiently answered as needed.  Patient/care giver encouraged to follow up with PCP as indicated.  In the event of an emergency, patient/care giver instructed to call 911 or go to the nearest emergency room.     Current Discharge Medication List      No prescriptions given at discharge.

## 2017-10-14 LAB — ECG 12-LEAD
Atrial Rate: 102 {beats}/min
Calculated P Axis: 53 degrees
Calculated R Axis: 87 degrees
QRS Duration: 102 ms
QT Interval: 356 ms

## 2017-10-19 ENCOUNTER — Encounter (INDEPENDENT_AMBULATORY_CARE_PROVIDER_SITE_OTHER): Payer: Self-pay | Admitting: Family Medicine

## 2017-10-19 ENCOUNTER — Ambulatory Visit (INDEPENDENT_AMBULATORY_CARE_PROVIDER_SITE_OTHER): Payer: No Typology Code available for payment source | Admitting: Family Medicine

## 2017-10-19 VITALS — BP 112/70 | HR 111 | Temp 98.8°F | Resp 16 | Ht 61.0 in | Wt 147.0 lb

## 2017-10-19 DIAGNOSIS — G4709 Other insomnia: Secondary | ICD-10-CM

## 2017-10-19 DIAGNOSIS — R5383 Other fatigue: Secondary | ICD-10-CM

## 2017-10-19 DIAGNOSIS — F319 Bipolar disorder, unspecified: Secondary | ICD-10-CM

## 2017-10-19 MED ORDER — ZOLPIDEM TARTRATE 10 MG PO TABS
10.00 mg | ORAL_TABLET | Freq: Every evening | ORAL | 1 refills | Status: DC | PRN
Start: ? — End: 2017-10-19

## 2017-10-19 NOTE — Progress Notes (Signed)
Subjective:    Patient ID: Regina Adams is a 32 y.o. female.    Is here today for follow-up.  Still having insomnia.  Has not done her sleep study her insurance denied this.  Apparently his psych medications were discontinued last week by [facility].  She needs to be off her medications because she has been tried on several different medications but nothing is working for her.  She is currently without medicine and she is working with Ronaldo Miyamoto her psychologist.  She states she has a lot of stresses at home taking care of her children getting them into the school bus.  A lot of cleaning in the house.  Her husband is away most of the time as a Naval architect.      The following portions of the patient's history were reviewed and updated as appropriate: allergies, current medications, past family history, past medical history, past social history, past surgical history and problem list.    Past Medical History:   Diagnosis Date   . Anemia    . Anxiety    . Asthma without status asthmaticus    . Depression    . Hormone disorder    . Migraines    . 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 albuterol (VENTOLIN HFA) 108 (90 Base) MCG/ACT inhaler Inhale 2 puffs into the lungs every 6 (six) hours as needed for Wheezing. 1 Inhaler 0   . fluticasone (FLONASE) 50 MCG/ACT nasal spray 2 sprays by Nasal route daily         . gabapentin (NEURONTIN) 100 MG capsule TAKE 1 CAPSULE BY MOUTH THREE TIMES DAILY 90 capsule 1   . ibuprofen (ADVIL,MOTRIN) 800 MG tablet Take 1 tablet (800 mg total) by mouth every 8 (eight) hours as needed for Pain. 20 tablet 0   . Multiple Vitamins-Calcium (DAILY COMBO MULTIVITS/CALCIUM PO) Take by mouth.       No current facility-administered medications on file prior to visit.      Allergies   Allergen Reactions   . Amoxicillin Anaphylaxis   . Hydromorphone Shortness Of  Breath   . Iodine Other (See Comments)     Blisters on left arm   . Spironolactone Hives and Rash   . Penicillins Hives     Review of Systems   Constitutional: Positive for fatigue and unexpected weight change. Negative for fever.   HENT: Negative for congestion.    Respiratory: Negative for cough, shortness of breath and wheezing.    Cardiovascular: Negative for chest pain and palpitations.   Musculoskeletal: Negative.    Neurological: Positive for headaches. Negative for dizziness, tremors, syncope, weakness and numbness.   Hematological: Negative for adenopathy.   Psychiatric/Behavioral: Positive for dysphoric mood and sleep disturbance. Negative for agitation, behavioral problems, confusion and suicidal ideas. The patient is nervous/anxious (More anxious). The patient is not hyperactive.            Objective:    Physical Exam   Constitutional: She is oriented to person, place, and time. She appears well-developed and well-nourished. No distress.   Appears tired   HENT:   Head: Normocephalic and atraumatic.   Right Ear: External ear normal.   Left Ear: External ear normal.   Mouth/Throat: Oropharynx is clear and moist. No oropharyngeal exudate.   Eyes: Conjunctivae and EOM  are normal.   Neck: Neck supple. No thyromegaly present.   Cardiovascular: Normal heart sounds.    Pulmonary/Chest: Breath sounds normal.   Neurological: She is alert and oriented to person, place, and time. No cranial nerve deficit or sensory deficit.   Psychiatric: She has a normal mood and affect. Her behavior is normal. Thought content normal.   Nursing note and vitals reviewed.          Assessment:       1. Other insomnia    2. Bipolar 1 disorder    3. Fatigue, unspecified type          Plan:       1. Other insomnia  - Pulse oximetry, overnight; Future  -Trial of zolpidem at this time to help her sleep.  -Zolpidem (AMBIEN) 10 MG tablet; Take 1 tablet (10 mg total) by mouth nightly as needed for Sleep  Dispense: 30 tablet; Refill: 1    2.  Bipolar 1 disorder  -Working with her psychologist and psychiatrist    3. Fatigue, unspecified type  -Improved sleep  -Exercise when able.   -Lessen stress    This note was completed using dragon medical speech recognition software. Grammatical errors, random word insertions, pronoun errors, incorrect word insertion, misspellingsand incomplete sentences are occasional consequences of this technology due to software limitations. If there are questions or concerns about the content of this note or information contained within the body of this dictation they should be addressed with the provider for clarification.

## 2017-10-23 ENCOUNTER — Encounter (INDEPENDENT_AMBULATORY_CARE_PROVIDER_SITE_OTHER): Payer: Self-pay | Admitting: Student in an Organized Health Care Education/Training Program

## 2017-10-23 ENCOUNTER — Ambulatory Visit (INDEPENDENT_AMBULATORY_CARE_PROVIDER_SITE_OTHER): Payer: Medicaid Other | Admitting: Student in an Organized Health Care Education/Training Program

## 2017-10-23 ENCOUNTER — Other Ambulatory Visit: Payer: Self-pay | Attending: Student in an Organized Health Care Education/Training Program

## 2017-10-23 VITALS — BP 106/74 | HR 100 | Temp 99.0°F | Resp 18 | Ht 63.0 in | Wt 145.0 lb

## 2017-10-23 DIAGNOSIS — R319 Hematuria, unspecified: Secondary | ICD-10-CM | POA: Insufficient documentation

## 2017-10-23 DIAGNOSIS — F17210 Nicotine dependence, cigarettes, uncomplicated: Secondary | ICD-10-CM

## 2017-10-23 LAB — URINALYSIS WITH MICROSCOPIC REFLEX IF INDICATED BMC/JMC ONLY
GLUCOSE: NORMAL mg/dL
KETONES: NEGATIVE mg/dL
NITRITE: NEGATIVE
PH: 8 — ABNORMAL HIGH (ref 5.0–7.5)
PROTEIN: 30 mg/dL — AB
SPECIFIC GRAVITY: 1.01 (ref 1.005–1.020)
UROBILINOGEN: <1 mg/dL (ref <=2.0)

## 2017-10-23 LAB — URINALYSIS, MICROSCOPIC: RBCS: >30 /hpf — AB

## 2017-10-23 NOTE — Patient Instructions (Signed)
[facility]  [address]  [place] [id number]  Phone: [phone]  Fax: [phone]           Open Daily 8:00am - 8:00pm, except Sundays 12pm-8pm         ~ Closed Thanksgiving and Christmas Day     Attending Caregiver: Fredricka BoninePaymon Dorthy Magnussen, MD    Today's orders:   Orders Placed This Encounter   . URINE CULTURE   . URINALYSIS WITH MICROSCOPIC REFLEX IF INDICATED [facility] ONLY   . POCT URINE DIPSTICK        Prescription(s) E-Rx to:  [facility] - [place], [place] - [address]    ________________________________________________________________________  Short Term Disability and Family Medical Leave Act  [facility] does NOT provide assistance with any disability applications.  If you feel your medical condition requires you to be on disability, you will need to follow up with  Your primary care physician or a specialist.  We apologize for any inconvenience.    For Medication Prescribed by [facility]:  As an Urgent Care facility, our clinic does NOT offer prescription refills over the telephone.    If you need more of the medication one of our medical providers prescribed, you will  Either need to be re-evaluated by us or see your primary care physician.    ________________________________________________________________________      It is very important that we have a phone number that is the single best way to contact you in the event that we become aware of important clinical information or concerns after your discharge.  If the phone number you provided at registration is NOT this number you should inform staff and registration prior to leaving.      Your treatment and evaluation today was focused on identifying and treating potentially emergent conditions based on your presenting signs, symptoms, and history.  The resulting initial clinical impression and treatment plan is not intended to be definitive or a substitute for a full physical examination and evaluation by your  primary care provider.  If your symptoms persist, worsen, or you develop any new or concerning symptoms, you need to be evaluated.      If you received x-rays during your visit, be aware that the final and formal interpretation of those films by a radiologist may occur after your discharge.  If there is a significant discrepancy identified after your discharge, we will contact you at the telephone number provided at registration.      If you received a pelvic exam, you may have cultures pending for sexually transmitted diseases.  Positive cultures are reported to the [facility] Department of Health as required by state law.  You should be contacted if you cultures are positive.  We will not contact you if they are negative.  You did NOT receive a PAP smear (the screening test for cervical).  This specific test for women is best performed by your gynecologist or primary care provider when indicated.      If you are over 34 year old, we cannot discuss your personal health information with a parent, spouse, family member, or anyone else without your express consent.  This does not include those who have legitimate access to your records and information to assist in your care under the provisions of HIPAA [facility](Health Insurance Portability and Accountability Act) law, or those to whom you have previously given express written consent to do so, such a legal guardian or Power of [place].  You may have received medication that may cause you to feel drowsy and/or light headed for several hours.  You may even experience some amnesia of your stay.  You should avoid operating a motor vehicle or performing any activity requiring complete alertness or coordination until you feel fully awake (approximately 24-48 hours).  Avoid alcoholic beverages.  You may also have a dry mouth for several hours.  This is a normal side effect and will disappear as the effects of the medication wear off.      Instructions discussed with patient upon  discharge by clinical staff with all questions answered.  Please call [facility] [phone] if any further questions.  Go immediately to the emergency department if any concern or worsening symptoms.    Fredricka Bonine, MD 10/23/2017, 11:53

## 2017-10-23 NOTE — Progress Notes (Signed)
[facility]  [address]  [place] [address]       Name: Lindsay Mccullough MRN:  [id number]   Date: 10/23/2017 Age: 32 y.o.         Patient name: .Lindsay Mccullough  Date of birth:1985-11-18    Date of service:10/23/17    CHIEF COMPLAINT: Blood in urine      SUBJECTIVE: Lindsay Mccullough a 32 y.o. female who complains of noticing blood in her urine every time she voids since yesterday.   She denies urinary frequency, urgency and dysuria.  Denies flank pain, fever, chills   Endorses history of kidney stone in August last year that needed shock treatment.    She feels well otherwise.     Medical History:  Past Medical History:   Diagnosis Date   . Anxiety    . Asthma     controlled with prn Inhaler   . Back problem     chronic lower back pain   . Blood in stool 11/16/2016   . Chronic pain     chronic lower back pain   . Cigarette smoker    . Depression    . Dysplasia of cervix    . Elevated d-dimer     Hx of   . H/O seasonal allergies    . Hx of bronchitis most recent 05/2016   . Left shoulder pain    . Migraine    . 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 Margo Aye evaluated neck 11/17/16, occ. neck pain   . Nephrolithiasis    . Panic attack    . Shortness of breath     Recenty with SOB and left shoulder blader pain-has to sit up to breathe    . Transfusion history 2013    with childbirth         Past Surgical History:   Procedure Laterality Date   . HX DILATION AND CURETTAGE  2006    sec. to miscarriage   . HX LITHOTRIPSY  10/2016         Family Medical History:     Problem Relation (Age of Onset)    Allergic rhinitis Brother    Breast Cancer Maternal Aunt    Cirrhosis Father    Colon Cancer Other    Congestive Heart Failure Mother    Diabetes Paternal Grandmother    Hearing Loss Mother    Heart Attack Mother    Heart Disease Mother    High Cholesterol Mother, Father    Hypertension (High Blood Pressure) Mother, Father, Brother            Social History          Socioeconomic History   . Marital status: Divorced     Spouse name: Not on file   . Number of children: Not on file   . Years of education: Not on file   . Highest education level: Not on file   Tobacco Use   . Smoking status: Current Every Day Smoker     Packs/day: 0.25     Years: 17.00     Pack years: 4.25     Types: Cigarettes   . Smokeless tobacco: Never Used   Substance and Sexual Activity   . Alcohol use: No     Alcohol/week: 0.0 standard drinks   . Drug use: No   Other Topics Concern   . Breast Self Exam  No   . Ability to Walk 1 Flight of Steps without SOB/CP Yes   . Routine Exercise Yes     Comment: walking   . Ability To Do Own ADL's Yes     Expanded Substance History     Additional history       Review of Systems   Pertinent items are noted in HPI. All pertinent positives and negatives noted in the HPI  Constitutional: No recent illness, no fever, no chills  Neuro: No dizziness, No lightheadedness, No syncope, no hx of seizures.   Muskuloskeletal: No myalgias, No arthralgias.  GI: no abdominal pain, nausea, vomiting, diarrhea.  GU: See hpi  Skin: no rashes.    OBJECTIVE:   EXAM:  General Vitals: BP 106/74 (Site: Left, Patient Position: Sitting, Cuff Size: Adult)   Pulse 100   Temp 37.2 C (99 F) (Oral)   Resp 18   Ht 1.6 m (5\' 3" )   Wt 65.8 kg (145 lb)   LMP 10/02/2017   SpO2 97%   BMI 25.69 kg/m   General: appears in good health, appears stated age and no distress  Lungs: clear to auscultation bilaterally, no wheezes or crackles   Cardiovascular:    Heart regular rate and rhythm, S1, S2 normal, no murmur, click, rub or gallop  Abdomen: soft, non-tender, non-distended, and no palpable hernias. No CVA TTP or percussion.   Skin: Skin color, texture, turgor normal.   Neurologic:  alert and oriented x3, moves all 4 extremities, normal gait    LABS:   Urine Dip Results:  Time collected: 1145  Glucose: Negative  Bilirubin: Negative  Ketones: Negative  Urine Specific Gravity: 1.015  Blood (urine):  Large (3+)  Protein: (!) 1+ (30mg /dL)  Urobilinogen: 0.2mg /dL (Normal)  Nitrite: Negative  Leukocytes: Trace    Assessment and plan:     ICD-10-CM    1. Hematuria R31.9 POCT URINE DIPSTICK     URINE CULTURE     URINALYSIS WITH MICROSCOPIC REFLEX IF INDICATED [facility] ONLY     Medication Orders   No Medications ordered     Suspect renal stone likely passed already  Strain urine and/or watch for stone   Send urine for culture and treat accordingly  Also send urine for microscopy to confirm blood  F/U with PCP in 1 week if results are negative and still continuing to have unexplained hematuria   If develops fevers, N/V, chills, back pain, urinary symptoms then f/u in the ER  Patient agree with the above plan and verbalized understanding.    Portions of this note may be dictated using voice recognition software or a dictation service. Variances in spelling and vocabulary are possible and unintentional. Not all errors are caught/corrected. Please notify the [facility] if any discrepancies are noted or if the meaning of any statement is not clear.     Fredricka BoninePaymon Ameliyah Sarno, MD

## 2017-10-23 NOTE — Nursing Note (Signed)
10/23/17 1100   Urine Dipstick   Time collected 1145   Initials aaf   Color Amber   Clarity Clear   Leukocytes Trace   Nitrite Negative   Urobilinogen 0.2mg /dL (Normal)   Protein (!) 1+ (30mg /dL)   pH 7.0   Blood (urine) Large (3+)   Urine Specific Gravity 1.015   Ketones Negative   Bilirubin Negative   Glucose Negative   Bottle Number   (Siemens Multistix 10 SG)   (2161)   Lot # [id number]   Expiration Date 11/29/17   Micro Sent yes   Culture Sent yes

## 2017-10-23 NOTE — Nursing Note (Signed)
BP 106/74 (Site: Left, Patient Position: Sitting, Cuff Size: Adult)   Pulse 100   Temp 37.2 C (99 F) (Oral)   Resp 18   Ht 1.6 m (5\' 3" )   Wt 65.8 kg (145 lb)   LMP 10/02/2017   SpO2 97%   BMI 25.69 kg/m     Lindsay Mallingngela Hever Castilleja, LPN  9/14/78298/24/2019, 11:29

## 2017-10-24 LAB — URINE CULTURE

## 2017-10-24 NOTE — Progress Notes (Signed)
Called and spoke to patient to discuss lab results--  urine culture showed mixed flora which is a probable contaminant meaning there was  a problem in the collection process.  If  symptoms fail to improve follow up with PCP in the next week

## 2017-10-25 ENCOUNTER — Other Ambulatory Visit (INDEPENDENT_AMBULATORY_CARE_PROVIDER_SITE_OTHER): Payer: Self-pay | Admitting: Family Medicine

## 2017-10-25 ENCOUNTER — Encounter (INDEPENDENT_AMBULATORY_CARE_PROVIDER_SITE_OTHER): Payer: Self-pay | Admitting: Student in an Organized Health Care Education/Training Program

## 2017-10-25 NOTE — Nurses Notes (Signed)
Pt seen in urgent care 2 days ago. Attempted to call for follow up, no answer at this time. Left VM for the patient to call back with any questions or concerns.

## 2017-11-23 ENCOUNTER — Ambulatory Visit (INDEPENDENT_AMBULATORY_CARE_PROVIDER_SITE_OTHER): Payer: No Typology Code available for payment source | Admitting: Family Medicine

## 2017-12-17 ENCOUNTER — Encounter (INDEPENDENT_AMBULATORY_CARE_PROVIDER_SITE_OTHER): Payer: Self-pay

## 2017-12-17 ENCOUNTER — Ambulatory Visit (INDEPENDENT_AMBULATORY_CARE_PROVIDER_SITE_OTHER): Payer: No Typology Code available for payment source | Admitting: Family Medicine

## 2017-12-27 ENCOUNTER — Encounter (INDEPENDENT_AMBULATORY_CARE_PROVIDER_SITE_OTHER): Payer: Medicaid Other

## 2017-12-27 ENCOUNTER — Ambulatory Visit (INDEPENDENT_AMBULATORY_CARE_PROVIDER_SITE_OTHER): Payer: Medicaid Other | Admitting: Family

## 2017-12-27 ENCOUNTER — Encounter (INDEPENDENT_AMBULATORY_CARE_PROVIDER_SITE_OTHER): Payer: Self-pay | Admitting: Family

## 2017-12-27 VITALS — BP 101/70 | HR 97 | Temp 98.1°F | Resp 16 | Ht 63.0 in | Wt 149.0 lb

## 2017-12-27 DIAGNOSIS — R6883 Chills (without fever): Secondary | ICD-10-CM

## 2017-12-27 DIAGNOSIS — R0781 Pleurodynia: Secondary | ICD-10-CM

## 2017-12-27 DIAGNOSIS — R059 Cough, unspecified: Secondary | ICD-10-CM

## 2017-12-27 DIAGNOSIS — R05 Cough: Secondary | ICD-10-CM

## 2017-12-27 MED ORDER — DOXYCYCLINE HYCLATE 100 MG CAPSULE
100.00 mg | ORAL_CAPSULE | Freq: Two times a day (BID) | ORAL | 0 refills | Status: AC
Start: 2017-12-27 — End: 2018-01-03

## 2017-12-27 MED ORDER — NAPROXEN 500 MG TABLET
500.00 mg | ORAL_TABLET | Freq: Two times a day (BID) | ORAL | 0 refills | Status: AC
Start: 2017-12-27 — End: 2018-01-03

## 2017-12-27 MED ORDER — BENZONATATE 200 MG CAPSULE
200.00 mg | ORAL_CAPSULE | Freq: Three times a day (TID) | ORAL | 0 refills | Status: DC | PRN
Start: 2017-12-27 — End: 2018-02-09

## 2017-12-27 NOTE — Progress Notes (Signed)
[facility]  [address]  [place] [address]       Name: Lindsay Mccullough MRN:  [id number]   Date: 12/27/2017 Age: 32 y.o. 24-Feb-1986        Chief Complaint:   Chief Complaint            Rib Pain           HPI: Lindsay Mccullough is a 32 y.o. female who presents today with Rib Pain.      Patient presents today with complaints of a cough, chills but no recorded fever, right-sided mid axillary rib pain for the past 5 days with no known injury.  She states she has tried Tylenol with no improvement.  She states the pain is a 10/10.  She states she has some shortness of breath with coughing.  She denies DOE.  Daily smoker.  She has a history of  elevated D-dimer, with CT angio 11/18/16 workup for PE which was negative.  She does have a history of anxiety.  She had a workup October 13, 2017 for chest pain which was negative.      History:  Vital signs and history as obtained by clinical staff.  Past Medical History:   Diagnosis Date   . Anxiety    . Asthma     controlled with prn Inhaler   . Back problem     chronic lower back pain   . Blood in stool 11/16/2016   . Chronic pain     chronic lower back pain   . Cigarette smoker    . Depression    . Dysplasia of cervix    . Elevated d-dimer     Hx of   . H/O seasonal allergies    . Hx of bronchitis most recent 05/2016   . Left shoulder pain    . Migraine    . 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 Margo Aye evaluated neck 11/17/16, occ. neck pain   . Nephrolithiasis    . Panic attack    . Shortness of breath     Recenty with SOB and left shoulder blader pain-has to sit up to breathe    . Transfusion history 2013    with childbirth         Past Surgical History:   Procedure Laterality Date   . HX DILATION AND CURETTAGE  2006    sec. to miscarriage   . HX LITHOTRIPSY  10/2016         Family Medical History:     Problem Relation (Age of Onset)    Allergic rhinitis Brother    Breast Cancer Maternal Aunt    Cirrhosis  Father    Colon Cancer Other    Congestive Heart Failure Mother    Diabetes Paternal Grandmother    Hearing Loss Mother    Heart Attack Mother    Heart Disease Mother    High Cholesterol Mother, Father    Hypertension (High Blood Pressure) Mother, Father, Brother            Social History     Socioeconomic History   . Marital status: Divorced     Spouse name: Not on file   . Number of children: Not on file   . Years of education: Not on file   . Highest education level: Not on file   Tobacco Use   . Smoking status:  Current Every Day Smoker     Packs/day: 0.25     Years: 17.00     Pack years: 4.25     Types: Cigarettes   . Smokeless tobacco: Never Used   Substance and Sexual Activity   . Alcohol use: No     Alcohol/week: 0.0 standard drinks   . Drug use: No   Other Topics Concern   . Breast Self Exam No   . Ability to Walk 1 Flight of Steps without SOB/CP Yes   . Routine Exercise Yes     Comment: walking   . Ability To Do Own ADL's Yes     Expanded Substance History     Additional history       Allergies:  Allergies   Allergen Reactions   . Amoxicillin Anaphylaxis   . Dilaudid [Hydromorphone] Shortness of Breath   . Spironolactone Rash and Hives/ Urticaria   . Iodine      Blisters on left arm   . Penicillins Hives/ Urticaria     Problem List:  Patient Active Problem List    Diagnosis   . Anxiety   . Depression   . Nephrolithiasis   . D-dimer, elevated   . Abnormal blood test   . Chest pain   . Elevated d-dimer   . Cyst of left ovary   . Hyperandrogenemia   . Elevated dehydroepiandrosterone (DHEA) level (CMS HCC)   . Asthma   . Hirsutism     Medication:  Outpatient Encounter Medications as of 12/27/2017   Medication Sig Dispense Refill   . albuterol sulfate (PROVENTIL OR VENTOLIN OR PROAIR) 90 mcg/actuation Inhalation HFA Aerosol Inhaler Take 1-2 Puffs by inhalation Every 4 hours as needed (Wheezing) 6.7 g 0   . Benzonatate (TESSALON) 200 mg Oral Capsule Take 1 Cap (200 mg total) by mouth Three times a day as needed  for Cough 30 Cap 0   . cyclobenzaprine (FLEXERIL) 10 mg Oral Tablet 10 mg Three times a day   0   . doxycycline hyclate (VIBRAMYCIN) 100 mg Oral Capsule Take 1 Cap (100 mg total) by mouth Twice daily for 7 days 14 Cap 0   . fluticasone (FLONASE) 50 mcg/actuation Nasal Spray, Suspension 1 Spray by Each Nostril route Once a day     . gabapentin (NEURONTIN) 100 mg Oral Capsule Take 100 mg by mouth Three times a day     . lamoTRIgine (LAMICTAL) 25 mg Oral Tablet   0   . MULTIVITS,CA,MINERALS/IRON/FA (ONE-A-DAY WOMENS FORMULA ORAL) Take 1 Tab by mouth Once a day      . naproxen (NAPROSYN) 500 mg Oral Tablet Take 1 Tab (500 mg total) by mouth Twice daily with food for 7 days 14 Tab 0   . PARoxetine (PAXIL) 20 mg Oral Tablet TAKE 1 TABLET BY MOUTH ONCE DAILY  2   . zolpidem (AMBIEN) 10 mg Oral Tablet Take 10 mg by mouth       No facility-administered encounter medications on file as of 12/27/2017.        Review of Systems:  Pertinent items are noted in HPI. All pertinent positives and negatives noted in the HPIAs plus below:    Constitutional: No recent illness, no fever, + chills,   Eyes: No blurring of vision  ENT: No sore throat.  No rhinorrhea. No sinus pains.   Respiratory: + cough  Cardiovascular: No chest pain.      Skin: No rashes.  MSK: right rib pain  Exam:  General Vitals: BP 101/70 (Site: Left, Patient Position: Sitting, Cuff Size: Adult)   Pulse 97   Temp 36.7 C (98.1 F) (Oral)   Resp 16   Ht 1.6 m (5\' 3" )   Wt 67.6 kg (149 lb)   LMP 11/23/2017   BMI 26.39 kg/m       General: appears in good health and no distress  Eyes: Conjunctivae/corneas clear  Head - normocephalic, atraumatic.  Neck- supple  HENT:Left TM and canal normal.  Right TM and canal normal. Nose without erythema. Mouth mucous membranes moist. Pharynx without injection or exudate. No oral lesions.   Lungs: + rhonchi throughout, no crackles, No rales, no wheezing, + TTP along 4-7 ribs right mid axillary line with palpation and  inspiration.   Cardiovascular: Heart regular rate and rhythm,   Skin: Skin color, texture, turgor normal.   Neurologic: alert and oriented x3.   Psych:  Anxious mood and affect, logical thought process, normal rate of speech    Assessment and Plan:    ICD-10-CM    1. Cough R05 XR CHEST PA & LAT   2. Rib pain on right side R07.81 XR CHEST PA & LAT   3. Chills R68.83 XR CHEST PA & LAT     Medication Orders   Medications   . Benzonatate (TESSALON) 200 mg Oral Capsule     Sig: Take 1 Cap (200 mg total) by mouth Three times a day as needed for Cough     Dispense:  30 Cap     Refill:  0   . doxycycline hyclate (VIBRAMYCIN) 100 mg Oral Capsule     Sig: Take 1 Cap (100 mg total) by mouth Twice daily for 7 days     Dispense:  14 Cap     Refill:  0   . naproxen (NAPROSYN) 500 mg Oral Tablet     Sig: Take 1 Tab (500 mg total) by mouth Twice daily with food for 7 days     Dispense:  14 Tab     Refill:  0     Chest x-ray negative for pneumonia, rib fracture, pneumothorax, per my read pending final Radiology report..  Bronchitis in a smoker with pleuritic pain.  Doxycyline take with food and probiotic.  Tessalon pearls as directed.  Naproxen as directed for pleuritic pain.   Rest, fluids, hot tea with honey, throat lozenges, humidified air may all be beneficial.  Strict instructions given that if symptoms worsen in any concerning way to go immediately to emergency department for further evaluation treatment.  Patient verbalized understanding, agrees with plan, questions answered.  Return for follow up with PCP.    Fleeta Emmer, Dyann Ruddle, CPNP-PC   Supervising Physician Dr. Wyn Quaker       Portions of this note may be dictated using voice recognition software or a dictation service. Variances in spelling and vocabulary are possible and unintentional. Not all errors are caught/corrected. Please notify the [facility] if any discrepancies are noted or if the meaning of any statement is not clear.

## 2017-12-27 NOTE — Nursing Note (Signed)
BP 101/70 (Site: Left, Patient Position: Sitting, Cuff Size: Adult)   Pulse 97   Temp 36.7 C (98.1 F) (Oral)   Resp 16   Ht 1.6 m (5\' 3" )   Wt 67.6 kg (149 lb)   LMP 11/23/2017   BMI 26.39 kg/m     Harriette Ohara, CMA  12/27/2017, 18:55

## 2017-12-27 NOTE — Patient Instructions (Signed)
[place] [address]  Phone: [phone]  Fax: [phone]           Open Daily 8:00am - 8:00pm, except Sundays 12pm-8pm         ~ Closed Thanksgiving and Christmas Day     Attending Caregiver: Johney Maine, APRN    Today's orders: No orders of the defined types were placed in this encounter.       Prescription(s) E-Rx to:  [place] [place]    ________________________________________________________________________  Short Term Disability and [facility] does NOT provide assistance with any disability applications.  If you feel your medical condition requires you to be on disability, you will need to follow up with  Your primary care physician or a specialist.  We apologize for any inconvenience.    For Medication Prescribed by [facility]:  As an Urgent Care facility, our clinic does NOT offer prescription refills over the telephone.    If you need more of the medication one of our medical providers prescribed, you will  Either need to be re-evaluated by [place] or see your primary care physician.    ________________________________________________________________________      It is very important that we have a phone number that is the single best way to contact you in the event that we become aware of important clinical information or concerns after your discharge.  If the phone number you provided at registration is NOT this number you should inform staff and registration prior to leaving.      Your treatment and evaluation today was focused on identifying and treating potentially emergent conditions based on your presenting signs, symptoms, and history.  The resulting initial clinical impression and treatment plan is not intended to be definitive or a substitute for a full physical examination and evaluation by your primary care provider.  If your symptoms persist, worsen, or you develop any new or  concerning symptoms, you need to be evaluated.      If you received x-rays during your visit, be aware that the final and formal interpretation of those films by a radiologist may occur after your discharge.  If there is a significant discrepancy identified after your discharge, we will contact you at the telephone number provided at registration.      If you received a pelvic exam, you may have cultures pending for sexually transmitted diseases.  Positive cultures are reported to the [place] Department of Health as required by state law.  You should be contacted if you cultures are positive.  We will not contact you if they are negative.  You did NOT receive a PAP smear (the screening test for cervical).  This specific test for women is best performed by your gynecologist or primary care provider when indicated.      If you are over 56 year old, we cannot discuss your personal health information with a parent, spouse, family member, or anyone else without your express consent.  This does not include those who have legitimate access to your records and information to assist in your care under the provisions of HIPAA ([place] and [place]) law, or those to whom you have previously given express written consent to do so, such a legal guardian or Power of [place].      You may have received medication that may cause you to feel drowsy and/or light headed  for several hours.  You may even experience some amnesia of your stay.  You should avoid operating a motor vehicle or performing any activity requiring complete alertness or coordination until you feel fully awake (approximately 24-48 hours).  Avoid alcoholic beverages.  You may also have a dry mouth for several hours.  This is a normal side effect and will disappear as the effects of the medication wear off.      Instructions discussed with patient upon discharge by clinical staff with all questions answered.  Please call [facility]  [phone] if any further questions.  Go immediately to the emergency department if any concern or worsening symptoms.    Fleeta Emmer, APRN 12/27/2017, 19:19

## 2017-12-28 NOTE — Progress Notes (Signed)
[facility]  Your xray did not show pneumonia as per radiologist official report . Followup with pcp if symptoms do not get better in one week.

## 2017-12-29 ENCOUNTER — Telehealth (INDEPENDENT_AMBULATORY_CARE_PROVIDER_SITE_OTHER): Payer: Self-pay

## 2017-12-29 NOTE — Telephone Encounter (Signed)
Follow-up call to urgent care visit. Left voice message for patient to call the office with any questions/concerns about the visit or if symptoms do not improve.  Carney Harder, RTR  12/29/2017, 09:36

## 2018-01-11 ENCOUNTER — Encounter (INDEPENDENT_AMBULATORY_CARE_PROVIDER_SITE_OTHER): Payer: Self-pay | Admitting: Family Medicine

## 2018-01-11 ENCOUNTER — Ambulatory Visit (INDEPENDENT_AMBULATORY_CARE_PROVIDER_SITE_OTHER): Payer: Medicaid Other | Admitting: Family Medicine

## 2018-01-11 VITALS — BP 119/78 | HR 99 | Temp 98.2°F | Resp 12 | Ht 64.4 in | Wt 148.6 lb

## 2018-01-11 DIAGNOSIS — J45901 Unspecified asthma with (acute) exacerbation: Secondary | ICD-10-CM

## 2018-01-11 DIAGNOSIS — J329 Chronic sinusitis, unspecified: Secondary | ICD-10-CM

## 2018-01-11 MED ORDER — PREDNISONE 20 MG TABLET
20.00 mg | ORAL_TABLET | Freq: Three times a day (TID) | ORAL | 0 refills | Status: AC
Start: 2018-01-11 — End: 2018-01-16

## 2018-01-11 MED ORDER — CEFUROXIME AXETIL 500 MG TABLET: 500 mg | Tab | Freq: Two times a day (BID) | ORAL | 0 refills | 0 days | Status: AC

## 2018-01-11 NOTE — Progress Notes (Signed)
[facility]  [address]  [place] [id number]       Name: Lindsay PeaksJessica Mccullough MRN:  [id number]   Date: 01/11/2018 Age: 32 y.o.       Chief Complaint: Productive Cough ("Green" - X 20 Days); Chest Pain; Runny Nose; Chills; and Headache      Subjective  Patient comes in she has had nasal congestion for the past 3 weeks.  Yellow drainage developing worsening cough.  His wheezing has taken her albuterol inhaler 4 times this morning.  She was treated with doxycycline early in the course but broke out in hives and had to stop early.  Symptoms have been worsening since she has been off the antibiotic.  She lists allergies to amoxicillin but has taken cephalosporin since then without any difficulty.  No chest pain.  No nausea vomiting.    Current Outpatient Medications   Medication Sig   . albuterol sulfate (PROVENTIL OR VENTOLIN OR PROAIR) 90 mcg/actuation Inhalation HFA Aerosol Inhaler Take 1-2 Puffs by inhalation Every 4 hours as needed (Wheezing)   . Benzonatate (TESSALON) 200 mg Oral Capsule Take 1 Cap (200 mg total) by mouth Three times a day as needed for Cough   . cefuroxime (CEFTIN) 500 mg Oral Tablet Take 1 Tab (500 mg total) by mouth Twice daily for 7 days   . cyclobenzaprine (FLEXERIL) 10 mg Oral Tablet 10 mg Three times a day    . fluticasone (FLONASE) 50 mcg/actuation Nasal Spray, Suspension 1 Spray by Each Nostril route Once a day   . gabapentin (NEURONTIN) 100 mg Oral Capsule Take 100 mg by mouth Three times a day   . lamoTRIgine (LAMICTAL) 25 mg Oral Tablet    . MULTIVITS,CA,MINERALS/IRON/FA (ONE-A-DAY WOMENS FORMULA ORAL) Take 1 Tab by mouth Once a day    . PARoxetine (PAXIL) 20 mg Oral Tablet TAKE 1 TABLET BY MOUTH ONCE DAILY   . predniSONE (DELTASONE) 20 mg Oral Tablet Take 1 Tab (20 mg total) by mouth Three times a day for 5 days   . zolpidem (AMBIEN) 10 mg Oral Tablet Take 10 mg by mouth     Allergies   Allergen Reactions   . Amoxicillin Anaphylaxis   . Dilaudid [Hydromorphone] Shortness of  Breath   . Spironolactone Rash and Hives/ Urticaria   . Iodine      Blisters on left arm   . Doxycycline Nausea/ Vomiting and Hives/ Urticaria   . Penicillins Hives/ Urticaria         Objective  Vitals: BP 119/78 (Site: Left, Patient Position: Sitting, Cuff Size: Adult)   Pulse 99   Temp 36.8 C (98.2 F) (Oral)   Resp 12   Ht 1.636 m (5' 4.4")   Wt 67.4 kg (148 lb 9.6 oz)   SpO2 95%   BMI 25.19 kg/m       General: no distress  Eyes: Conjunctiva clear.  HENT:  Postnasal drip  Neck: no adenopathy  Lungs: clear to auscultation bilaterally.   Cardiovascular:    Heart regular rate and rhythm    Assessment/Plan  1. Sinusitis, unspecified chronicity, unspecified location    2. Asthma exacerbation, mild        Orders Placed This Encounter   . cefuroxime (CEFTIN) 500 mg Oral Tablet   . predniSONE (DELTASONE) 20 mg Oral Tablet        Add in Ceftin and prednisone.  Keep follow-up with PCP.    Sunday CornWilliam Darrell Lewis, MD

## 2018-01-11 NOTE — Nursing Note (Signed)
BP 119/78 (Site: Left, Patient Position: Sitting, Cuff Size: Adult)   Pulse 99   Temp 36.8 C (98.2 F) (Oral)   Resp 12   Ht 1.636 m (5' 4.4")   Wt 67.4 kg (148 lb 9.6 oz)   SpO2 95%   BMI 25.19 kg/m     Lindsay Mccullough, RTR  01/11/2018, 11:29

## 2018-01-13 ENCOUNTER — Telehealth (INDEPENDENT_AMBULATORY_CARE_PROVIDER_SITE_OTHER): Payer: Self-pay | Admitting: Family Medicine

## 2018-01-13 NOTE — Nursing Note (Signed)
Patient was in office for an acute issue. Attempted to contact patient regarding acute visit to follow up on health status and to answer any questions or concerns.There was no answer but message left on patient's voicemail to call if there are any questions or concerns.  Ranch[place] Santa FeBrittany Coufal, [place]  01/13/2018, 10:25

## 2018-02-09 ENCOUNTER — Emergency Department (HOSPITAL_BASED_OUTPATIENT_CLINIC_OR_DEPARTMENT_OTHER): Payer: Medicaid Other

## 2018-02-09 ENCOUNTER — Emergency Department (HOSPITAL_BASED_OUTPATIENT_CLINIC_OR_DEPARTMENT_OTHER)
Admission: EM | Admit: 2018-02-09 | Discharge: 2018-02-09 | Disposition: A | Discharge status: Home / Self Care | Payer: Medicaid Other | Attending: Emergency Medicine | Admitting: Emergency Medicine

## 2018-02-09 ENCOUNTER — Other Ambulatory Visit: Payer: Self-pay

## 2018-02-09 ENCOUNTER — Encounter (HOSPITAL_BASED_OUTPATIENT_CLINIC_OR_DEPARTMENT_OTHER): Payer: Self-pay

## 2018-02-09 DIAGNOSIS — R059 Cough, unspecified: Secondary | ICD-10-CM

## 2018-02-09 DIAGNOSIS — F329 Major depressive disorder, single episode, unspecified: Secondary | ICD-10-CM | POA: Insufficient documentation

## 2018-02-09 DIAGNOSIS — J069 Acute upper respiratory infection, unspecified: Principal | ICD-10-CM | POA: Insufficient documentation

## 2018-02-09 DIAGNOSIS — R079 Chest pain, unspecified: Secondary | ICD-10-CM | POA: Insufficient documentation

## 2018-02-09 DIAGNOSIS — G8929 Other chronic pain: Secondary | ICD-10-CM | POA: Insufficient documentation

## 2018-02-09 DIAGNOSIS — Z7951 Long term (current) use of inhaled steroids: Secondary | ICD-10-CM | POA: Insufficient documentation

## 2018-02-09 DIAGNOSIS — I4519 Other right bundle-branch block: Secondary | ICD-10-CM | POA: Insufficient documentation

## 2018-02-09 DIAGNOSIS — Z79899 Other long term (current) drug therapy: Secondary | ICD-10-CM | POA: Insufficient documentation

## 2018-02-09 DIAGNOSIS — R05 Cough: Secondary | ICD-10-CM | POA: Insufficient documentation

## 2018-02-09 DIAGNOSIS — F17210 Nicotine dependence, cigarettes, uncomplicated: Secondary | ICD-10-CM | POA: Insufficient documentation

## 2018-02-09 DIAGNOSIS — Z720 Tobacco use: Secondary | ICD-10-CM

## 2018-02-09 DIAGNOSIS — M545 Low back pain: Secondary | ICD-10-CM | POA: Insufficient documentation

## 2018-02-09 DIAGNOSIS — J45909 Unspecified asthma, uncomplicated: Secondary | ICD-10-CM | POA: Insufficient documentation

## 2018-02-09 LAB — COMPREHENSIVE METABOLIC PROFILE - BMC/JMC ONLY
ALBUMIN/GLOBULIN RATIO: 1.3 (ref 0.8–2.0)
ALBUMIN: 3.9 g/dL (ref 3.5–5.0)
ALKALINE PHOSPHATASE: 88 U/L (ref 38–126)
ALT (SGPT): 17 U/L (ref 14–54)
ANION GAP: 10 mmol/L (ref 3–11)
AST (SGOT): 21 U/L (ref 15–41)
BILIRUBIN TOTAL: 0.3 mg/dL (ref 0.3–1.2)
BUN/CREA RATIO: 3 — ABNORMAL LOW (ref 6–22)
BUN: 2 mg/dL — ABNORMAL LOW (ref 6–20)
CALCIUM: 9.4 mg/dL (ref 8.6–10.3)
CHLORIDE: 102 mmol/L (ref 101–111)
CO2 TOTAL: 27 mmol/L (ref 22–32)
CREATININE: 0.68 mg/dL (ref 0.44–1.00)
ESTIMATED GFR: >60 mL/min/1.73mˆ2 (ref >60)
GLUCOSE: 110 mg/dL (ref 70–110)
POTASSIUM: 3.9 mmol/L (ref 3.4–5.1)
PROTEIN TOTAL: 7 g/dL (ref 6.4–8.3)
SODIUM: 139 mmol/L (ref 136–145)

## 2018-02-09 LAB — CBC WITH DIFF
BASOPHIL #: 0 x10ˆ3/uL (ref 0.00–0.10)
BASOPHIL %: 1 % (ref 0–3)
EOSINOPHIL #: 0.1 x10ˆ3/uL (ref 0.00–0.50)
EOSINOPHIL %: 1 % (ref 0–5)
HCT: 43.1 % (ref 36.0–45.0)
HGB: 14.8 g/dL (ref 12.0–15.5)
LYMPHOCYTE #: 3 10*3/uL (ref 1.00–4.80)
LYMPHOCYTE %: 37 % (ref 15–43)
MCH: 33.5 pg — ABNORMAL HIGH (ref 27.5–33.2)
MCHC: 34.3 g/dL (ref 32.0–36.0)
MCV: 97.7 fL — ABNORMAL HIGH (ref 82.0–97.0)
MONOCYTE #: 0.6 10*3/uL (ref 0.20–0.90)
MONOCYTE %: 8 % (ref 5–12)
MPV: 7 fL — ABNORMAL LOW (ref 7.4–10.5)
NEUTROPHIL #: 4.4 x10ˆ3/uL (ref 1.50–6.50)
NEUTROPHIL %: 54 % (ref 43–76)
PLATELETS: 315 x10ˆ3/uL (ref 150–450)
RBC: 4.41 x10ˆ6/uL (ref 4.00–5.10)
RDW: 13.2 % (ref 11.0–16.0)
WBC: 8.2 x10ˆ3/uL (ref 4.0–11.0)

## 2018-02-09 LAB — TROPONIN-I: TROPONIN I: <0.03 ng/mL (ref <=0.06)

## 2018-02-09 LAB — BLUE TOP TUBE

## 2018-02-09 MED ORDER — AZITHROMYCIN 250 MG TABLET
500.00 mg | ORAL_TABLET | ORAL | Status: AC
Start: 2018-02-09 — End: 2018-02-09
  Administered 2018-02-09: 500 mg via ORAL
  Filled 2018-02-09: qty 2

## 2018-02-09 MED ORDER — SODIUM CHLORIDE 0.9 % (FLUSH) INJECTION SYRINGE
10.00 mL | INJECTION | Freq: Three times a day (TID) | INTRAMUSCULAR | Status: DC
Start: 2018-02-09 — End: 2018-02-10

## 2018-02-09 MED ORDER — PREDNISONE 20 MG TABLET
100.00 mg | ORAL_TABLET | ORAL | Status: AC
Start: 2018-02-09 — End: 2018-02-09
  Administered 2018-02-09: 100 mg via ORAL
  Filled 2018-02-09: qty 5

## 2018-02-09 MED ORDER — BENZONATATE 100 MG CAPSULE
200.00 mg | ORAL_CAPSULE | ORAL | Status: AC
Start: 2018-02-09 — End: 2018-02-09
  Administered 2018-02-09: 200 mg via ORAL
  Filled 2018-02-09: qty 2

## 2018-02-09 MED ORDER — PREDNISONE 50 MG TABLET
50.0000 mg | ORAL_TABLET | Freq: Every day | ORAL | 0 refills | Status: AC
Start: 2018-02-09 — End: 2018-02-14

## 2018-02-09 MED ORDER — BENZONATATE 200 MG CAPSULE
200.00 mg | ORAL_CAPSULE | Freq: Three times a day (TID) | ORAL | 0 refills | Status: AC
Start: 2018-02-09 — End: 2018-02-16

## 2018-02-09 MED ORDER — AZITHROMYCIN 250 MG TABLET
250.0000 mg | ORAL_TABLET | Freq: Every day | ORAL | 0 refills | Status: AC
Start: 2018-02-09 — End: 2018-02-13

## 2018-02-09 NOTE — ED Triage Notes (Signed)
Pt reports cough/cold symptoms for 6 weeks with multiple urgent care visits.  Pt reports worsening of symptoms now with productive cough, chest pain and SOB.  Pt denies measured fevers at home.  No acute distress noted, eupneic in triage and alert.  EKG complete before triage started.

## 2018-02-09 NOTE — ED Nurses Note (Signed)
EKG complete.

## 2018-02-09 NOTE — ED Nurses Note (Signed)
Patient discharged home with family.  AVS reviewed with patient/care giver.  A written copy of the AVS and discharge instructions was given to the patient/care giver.  Questions sufficiently answered as needed.  Patient/care giver encouraged to follow up with PCP as indicated.  In the event of an emergency, patient/care giver instructed to call 911 or go to the nearest emergency room.        Current Discharge Medication List      START taking these medications.      Details   azithromycin 250 mg Tablet  Commonly known as:  ZITHROMAX   250 mg, Oral, DAILY  Qty:  4 Tab  Refills:  0     predniSONE 50 mg Tablet  Commonly known as:  DELTASONE   50 mg, Oral, DAILY  Qty:  5 Tab  Refills:  0        CONTINUE these medications which have CHANGED during your visit.      Details   Benzonatate 200 mg Capsule  Commonly known as:  TESSALON  What changed:     when to take this   reasons to take this   200 mg, Oral, 3 TIMES DAILY  Qty:  21 Cap  Refills:  0        CONTINUE these medications - NO CHANGES were made during your visit.      Details   albuterol sulfate 90 mcg/actuation HFA Aerosol Inhaler  Commonly known as:  PROVENTIL or VENTOLIN or PROAIR   1-2 Puffs, Inhalation, EVERY 4 HOURS PRN  Qty:  6.7 g  Refills:  0     cyclobenzaprine 10 mg Tablet  Commonly known as:  FLEXERIL   10 mg, 3 TIMES DAILY  Refills:  0     fluticasone propionate 50 mcg/actuation Spray, Suspension  Commonly known as:  FLONASE   1 Spray, Each Nostril, DAILY  Refills:  0     gabapentin 100 mg Capsule  Commonly known as:  NEURONTIN   100 mg, Oral, 3 TIMES DAILY  Refills:  0     lamoTRIgine 25 mg Tablet  Commonly known as:  LAMICTAL   No dose, route, or frequency recorded.  Refills:  0     ONE-A-DAY WOMENS FORMULA ORAL   1 Tab, Oral, DAILY  Refills:  0     PARoxetine 20 mg Tablet  Commonly known as:  PAXIL   TAKE 1 TABLET BY MOUTH ONCE DAILY  Refills:  2     zolpidem 10 mg Tablet  Commonly known as:  AMBIEN   10 mg, Oral  Refills:  0

## 2018-02-09 NOTE — ED Provider Notes (Signed)
Kara Pacer, MD  [facility]  Emergency Department Visit Note    Date:  02/09/2018  Primary care provider:  Gean Birchwood, MD  Means of arrival:  private car  History obtained from: patient  History limited by: none    Chief Complaint:  Shortness of Breath/Chest Pain    HISTORY OF PRESENT ILLNESS     Everlena Mackley, date of birth 06-Mar-1985, is a 32 y.o. female who presents to the Emergency Department complaining of shortness of breath that initially began seven weeks ago. The patient also endorses complaints of a persistent, nonproductive cough and intermittent, "stabbing" chest pain, rated as a 9/10 in severity when present, that is exacerbated with coughing episodes. She affirms "chills" but denies measured fever, hemoptysis, nausea, vomiting, abdominal pain, diarrhea, ear pain, or sore throat. She is a current every day smoker. Her inhaler and over-the-counter medications have not provided relief of symptoms, and patient notes that her daughter was recently diagnosed with bronchitis. The patient has been evaluated at Urgent Care multiple times for her symptoms and has not been started on any medications.     REVIEW OF SYSTEMS     The pertinent positive and negative symptoms are as per HPI. All other systems reviewed and are negative.     PATIENT HISTORY     Past Medical History:  Past Medical History:   Diagnosis Date   . Anxiety    . Asthma     controlled with prn Inhaler   . Back problem     chronic lower back pain   . Blood in stool 11/16/2016   . Chronic pain     chronic lower back pain   . Cigarette smoker    . Depression    . Dysplasia of cervix    . Elevated d-dimer     Hx of   . H/O seasonal allergies    . Hx of bronchitis most recent 05/2016   . Left shoulder pain    . Migraine    . 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 Margo Aye evaluated neck 11/17/16, occ. neck pain   . Nephrolithiasis    . Panic attack    . Shortness of  breath     Recenty with SOB and left shoulder blader pain-has to sit up to breathe    . Transfusion history 2013    with childbirth       Past Surgical History:  Past Surgical History:   Procedure Laterality Date   . Hx dilation and curettage  2006   . Hx lithotripsy  10/2016       Family History:  Family Medical History:     Problem Relation (Age of Onset)    Allergic rhinitis Brother    Breast Cancer Maternal Aunt    Cirrhosis Father    Colon Cancer Other    Congestive Heart Failure Mother    Diabetes Paternal Grandmother    Hearing Loss Mother    Heart Attack Mother    Heart Disease Mother    High Cholesterol Mother, Father    Hypertension (High Blood Pressure) Mother, Father, Brother          Social History:  Social History     Tobacco Use   . Smoking status: Current Some Day Smoker     Packs/day: 0.25     Years: 17.00     Pack years:  4.25     Types: Cigarettes   . Smokeless tobacco: Never Used   Substance Use Topics   . Alcohol use: No     Alcohol/week: 0.0 standard drinks   . Drug use: No     Social History     Substance and Sexual Activity   Drug Use No       Medications:  Current Outpatient Medications   Medication Sig   . albuterol sulfate (PROVENTIL OR VENTOLIN OR PROAIR) 90 mcg/actuation Inhalation HFA Aerosol Inhaler Take 1-2 Puffs by inhalation Every 4 hours as needed (Wheezing)   . Benzonatate (TESSALON) 200 mg Oral Capsule Take 1 Cap (200 mg total) by mouth Three times a day as needed for Cough   . cyclobenzaprine (FLEXERIL) 10 mg Oral Tablet 10 mg Three times a day    . fluticasone (FLONASE) 50 mcg/actuation Nasal Spray, Suspension 1 Spray by Each Nostril route Once a day   . gabapentin (NEURONTIN) 100 mg Oral Capsule Take 100 mg by mouth Three times a day   . lamoTRIgine (LAMICTAL) 25 mg Oral Tablet    . MULTIVITS,CA,MINERALS/IRON/FA (ONE-A-DAY WOMENS FORMULA ORAL) Take 1 Tab by mouth Once a day    . PARoxetine (PAXIL) 20 mg Oral Tablet TAKE 1 TABLET BY MOUTH ONCE DAILY   . zolpidem (AMBIEN) 10 mg  Oral Tablet Take 10 mg by mouth       Allergies:  Allergies   Allergen Reactions   . Amoxicillin Anaphylaxis   . Dilaudid [Hydromorphone] Shortness of Breath   . Spironolactone Rash and Hives/ Urticaria   . Iodine      Blisters on left arm   . Doxycycline Nausea/ Vomiting and Hives/ Urticaria   . Penicillins Hives/ Urticaria       PHYSICAL EXAM     Vitals:   02/09/18 2037   BP: 136/78   Pulse: (!) 101   Resp: 18   Temp: 37.1 C (98.8 F)   SpO2: 94%     Pulse ox  94% on None (Room Air) interpreted by me as: Normal    Constitutional: The patient is alert and oriented to person, place, and time. Well-developed and well-nourished.  HENT: Atraumatic, normocephalic head. Mucous membranes moist. Nares unremarkable. Oropharynx shows no erythema or exudate.   Eyes: Pupils equal and round, reactive to light. No scleral icterus. Normal conjunctiva. Extraocular movements are intact.  Neck: Supple, non-tender, no nuchal rigidity, no adenopathy.   Lungs: Diffuse polyphonic wheezing, cough that is nonproductive. Symmetric and equal expansion. No respiratory distress or retractions.  Cardiovascular: Heart is S1-S2 regular rate and regular rhythm without murmur click or rub.  Abdomen:  Soft, non-distended. No tenderness to palpation without evidence of rebound or guarding. No pulsatile masses. No organomegaly.   Genitourinary: No CVA tenderness.  Extremities: Full range of motion, no clubbing, cyanosis, or edema. Pulses 2+, capillary refill <2 seconds.  Spine: No midline or paraspinal muscle tenderness to palpation. No step-off.   Skin: Warm and dry. No cyanosis, jaundice, rash or lesion.  Neurologic: Alert and oriented x3. Normal facial symmetry and speech, Normal upper and lower extremity strength, and grossly normal sensation.     DIFFERENTIAL DIAGNOSES     1. Pneumonia  2. Bronchitis  3. Tobacco abuse  4. Failure of outpatient management    DIAGNOSTIC STUDIES     Labs:    Results for orders placed or performed during the  hospital encounter of 02/09/18   COMPREHENSIVE METABOLIC PROFILE - [facility] ONLY  Result Value Ref Range    SODIUM 139 136 - 145 mmol/L    POTASSIUM 3.9 3.4 - 5.1 mmol/L    CHLORIDE 102 101 - 111 mmol/L    CO2 TOTAL 27 22 - 32 mmol/L    ANION GAP 10 3 - 11 mmol/L    BUN 2 (L) 6 - 20 mg/dL    CREATININE 3.24 4.01 - 1.00 mg/dL    BUN/CREA RATIO 3 (L) 6 - 22    ESTIMATED GFR >60 >60 mL/min/1.43m^2    ALBUMIN 3.9 3.5 - 5.0 g/dL    CALCIUM 9.4 8.6 - 02.7 mg/dL    GLUCOSE 253 70 - 664 mg/dL    ALKALINE PHOSPHATASE 88 38 - 126 U/L    ALT (SGPT) 17 14 - 54 U/L    AST (SGOT) 21 15 - 41 U/L    BILIRUBIN TOTAL 0.3 0.3 - 1.2 mg/dL    PROTEIN TOTAL 7.0 6.4 - 8.3 g/dL    ALBUMIN/GLOBULIN RATIO 1.3 0.8 - 2.0   TROPONIN-I   Result Value Ref Range    TROPONIN I <0.03 <=0.06 ng/mL   CBC WITH DIFF   Result Value Ref Range    WBC 8.2 4.0 - 11.0 x10^3/uL    RBC 4.41 4.00 - 5.10 x10^6/uL    HGB 14.8 12.0 - 15.5 g/dL    HCT 40.3 47.4 - 25.9 %    MCV 97.7 (H) 82.0 - 97.0 fL    MCH 33.5 (H) 27.5 - 33.2 pg    MCHC 34.3 32.0 - 36.0 g/dL    RDW 56.3 87.5 - 64.3 %    PLATELETS 315 150 - 450 x10^3/uL    MPV 7.0 (L) 7.4 - 10.5 fL    NEUTROPHIL % 54 43 - 76 %    LYMPHOCYTE % 37 15 - 43 %    MONOCYTE % 8 5 - 12 %    EOSINOPHIL % 1 0 - 5 %    BASOPHIL % 1 0 - 3 %    NEUTROPHIL # 4.40 1.50 - 6.50 x10^3/uL    LYMPHOCYTE # 3.00 1.00 - 4.80 x10^3/uL    MONOCYTE # 0.60 0.20 - 0.90 x10^3/uL    EOSINOPHIL # 0.10 0.00 - 0.50 x10^3/uL    BASOPHIL # 0.00 0.00 - 0.10 x10^3/uL     Labs reviewed and interpreted by me.    Radiology:    XR CHEST PA AND LATERAL   XR CHEST PA & LATERAL   ED Interpretation   No obvious infiltrate        Radiological imaging interpreted and independently reviewed by me.    EKG:  12 lead EKG interpreted by me shows normal sinus rhythm, rate of 100 bpm, normal axis, incomplete RBBB for a nonspecific EKG.     ED PROGRESS NOTE / MEDICAL DECISION MAKING     Old records reviewed by me:  I have reviewed the nurse's notes. I have reviewed the  patient's problem list and pertinent past medical records.     Orders Placed This Encounter   . XR CHEST PA & LATERAL   . COMPREHENSIVE METABOLIC PROFILE - [facility] ONLY   . TROPONIN-I   . CBC WITH DIFF   . ECG 12-LEAD (Take to provider with a brief history)   . INSERT & MAINTAIN PERIPHERAL IV ACCESS   . NS flush syringe   . predniSONE (DELTASONE) tablet   . azithromycin (ZITHROMAX) tablet   . benzonatate (TESSALON) capsule  XR chest, CBC, CMP, troponin, and EKG initially ordered.    22:20: Initial evaluation is complete at this time. I discussed with the patient that I have reviewed her workup, and I would treat for an upper respiratory tract infection. She should continue to use her inhaler at home as prescribed, and I recommended that she sleep upright for the next few days to help alleviate symptoms. Patient will receive prednisone, Zithromax, and Tessalon prior to discharge and be discharged to home with prescriptions for Zithromax, prednisone, and Tessalon. Patient is to follow up with Gean Birchwoodabuena, Philomela, MD (PCP) in two days as needed. I discussed the diagnosis, disposition, and follow-up plan. The patient understood and is in accordance with the treatment plan at this time. Patient is to return here to the Emergency Department if new or worsening symptoms appear. All of their questions have been answered to their satisfaction. The patient is in stable condition at the time of discharge.     MIPS     Not applicable     OPIATE PRESCRIPTION      Not applicable    CORE MEASURES     Not applicable    CRITICAL CARE TIME     Not applicable     PRE-DISPOSITION VITALS      Pre-Disposition Vitals:  Filed Vitals:    02/09/18 2037 02/09/18 2136   BP: 136/78 116/83   Pulse: (!) 101 (!) 103   Resp: 18 15   Temp: 37.1 C (98.8 F)    SpO2: 94% 97%     CLINICAL IMPRESSION     Encounter Diagnoses   Name Primary?   . URI (upper respiratory infection) Yes   . Tobacco abuse    . Cough in adult patient           DISPOSITION/PLAN     Discharged        Prescriptions:        Current Discharge Medication List      START taking these medications.      Details   azithromycin 250 mg Tablet  Commonly known as:  ZITHROMAX   250 mg, Oral, DAILY  Qty:  4 Tab  Refills:  0     predniSONE 50 mg Tablet  Commonly known as:  DELTASONE   50 mg, Oral, DAILY  Qty:  5 Tab  Refills:  0        CONTINUE these medications which have CHANGED during your visit.      Details   Benzonatate 200 mg Capsule  Commonly known as:  TESSALON  What changed:     when to take this   reasons to take this   200 mg, Oral, 3 TIMES DAILY  Qty:  21 Cap  Refills:  0          Follow-Up:     Gean Birchwoodabuena, Philomela, MD  [facility]  [address]  [address]  [place] [id number]  [phone]    Call in 2 days  As needed    Condition at Disposition: Stable      SCRIBE ATTESTATION STATEMENT  I Franchot HeidelbergEmma Palencar, SCRIBE scribed for Kara PacerLondner, Kenric Ginger S, MD on 02/09/2018 at 10:12 PM.     Documentation assistance provided for Kara PacerLondner, Matt Delpizzo S, MD  by Franchot HeidelbergEmma Palencar, SCRIBE. Information recorded by the scribe was done at my direction and has been reviewed and validated by me Kara PacerLondner, Kalman Nylen S, MD.

## 2018-02-10 LAB — ECG 12-LEAD
Atrial Rate: 100 {beats}/min
Calculated P Axis: 65 degrees
Calculated R Axis: 86 degrees
Calculated T Axis: 53 degrees
PR Interval: 180 ms
QRS Duration: 102 ms
QT Interval: 352 ms
QTC Calculation: 454 ms
Ventricular rate: 100 {beats}/min

## 2018-02-10 LAB — RED TOP TUBE
